# Patient Record
Sex: Female | Born: 1970 | Race: Black or African American | Hispanic: No | Marital: Single | Smoking: Current every day smoker
Health system: Southern US, Community
[De-identification: ages and names within clinical notes are randomized; demographics above are authoritative.]

## PROBLEM LIST (undated history)

## (undated) DIAGNOSIS — I10 Essential (primary) hypertension: Secondary | ICD-10-CM

## (undated) DIAGNOSIS — M1990 Unspecified osteoarthritis, unspecified site: Secondary | ICD-10-CM

## (undated) DIAGNOSIS — R519 Headache, unspecified: Secondary | ICD-10-CM

## (undated) DIAGNOSIS — K219 Gastro-esophageal reflux disease without esophagitis: Secondary | ICD-10-CM

## (undated) DIAGNOSIS — R569 Unspecified convulsions: Secondary | ICD-10-CM

## (undated) DIAGNOSIS — Z8614 Personal history of Methicillin resistant Staphylococcus aureus infection: Secondary | ICD-10-CM

## (undated) HISTORY — PX: APPENDECTOMY: SHX54

## (undated) HISTORY — PX: CHOLECYSTECTOMY: SHX55

## (undated) HISTORY — PX: TUBAL LIGATION: SHX77

---
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
## 2005-09-20 ENCOUNTER — Other Ambulatory Visit: Payer: Self-pay

## 2005-09-21 ENCOUNTER — Inpatient Hospital Stay: Payer: Self-pay | Admitting: Internal Medicine

## 2005-10-27 ENCOUNTER — Emergency Department: Payer: Self-pay | Admitting: General Practice

## 2005-10-27 ENCOUNTER — Other Ambulatory Visit: Payer: Self-pay

## 2006-05-27 ENCOUNTER — Other Ambulatory Visit: Payer: Self-pay

## 2006-05-27 ENCOUNTER — Emergency Department: Payer: Self-pay | Admitting: Emergency Medicine

## 2006-08-26 ENCOUNTER — Emergency Department: Payer: Self-pay | Admitting: Emergency Medicine

## 2007-02-18 ENCOUNTER — Emergency Department (HOSPITAL_COMMUNITY): Admission: EM | Admit: 2007-02-18 | Discharge: 2007-02-19 | Payer: Self-pay | Admitting: Family Medicine

## 2007-03-15 ENCOUNTER — Emergency Department (HOSPITAL_COMMUNITY): Admission: EM | Admit: 2007-03-15 | Discharge: 2007-03-15 | Payer: Self-pay | Admitting: Emergency Medicine

## 2007-06-25 ENCOUNTER — Emergency Department (HOSPITAL_COMMUNITY): Admission: EM | Admit: 2007-06-25 | Discharge: 2007-06-25 | Payer: Self-pay | Admitting: Emergency Medicine

## 2008-02-22 ENCOUNTER — Emergency Department (HOSPITAL_COMMUNITY): Admission: EM | Admit: 2008-02-22 | Discharge: 2008-02-22 | Payer: Self-pay | Admitting: Emergency Medicine

## 2008-04-13 ENCOUNTER — Emergency Department (HOSPITAL_COMMUNITY): Admission: EM | Admit: 2008-04-13 | Discharge: 2008-04-13 | Payer: Self-pay | Admitting: Emergency Medicine

## 2008-05-08 ENCOUNTER — Emergency Department (HOSPITAL_COMMUNITY): Admission: EM | Admit: 2008-05-08 | Discharge: 2008-05-08 | Payer: Self-pay | Admitting: Emergency Medicine

## 2008-05-12 ENCOUNTER — Emergency Department (HOSPITAL_COMMUNITY): Admission: EM | Admit: 2008-05-12 | Discharge: 2008-05-12 | Payer: Self-pay | Admitting: Emergency Medicine

## 2008-07-05 ENCOUNTER — Emergency Department (HOSPITAL_COMMUNITY): Admission: EM | Admit: 2008-07-05 | Discharge: 2008-07-05 | Payer: Self-pay | Admitting: Emergency Medicine

## 2008-10-24 ENCOUNTER — Emergency Department (HOSPITAL_COMMUNITY): Admission: EM | Admit: 2008-10-24 | Discharge: 2008-10-24 | Payer: Self-pay | Admitting: Emergency Medicine

## 2008-11-18 DIAGNOSIS — Z8614 Personal history of Methicillin resistant Staphylococcus aureus infection: Secondary | ICD-10-CM

## 2008-11-18 HISTORY — DX: Personal history of Methicillin resistant Staphylococcus aureus infection: Z86.14

## 2009-01-18 ENCOUNTER — Inpatient Hospital Stay (HOSPITAL_COMMUNITY): Admission: EM | Admit: 2009-01-18 | Discharge: 2009-01-20 | Payer: Self-pay | Admitting: Emergency Medicine

## 2009-01-18 ENCOUNTER — Encounter (INDEPENDENT_AMBULATORY_CARE_PROVIDER_SITE_OTHER): Payer: Self-pay | Admitting: General Surgery

## 2009-01-26 ENCOUNTER — Ambulatory Visit: Payer: Self-pay | Admitting: Family Medicine

## 2009-01-26 ENCOUNTER — Encounter (INDEPENDENT_AMBULATORY_CARE_PROVIDER_SITE_OTHER): Payer: Self-pay | Admitting: Family Medicine

## 2009-01-26 LAB — CONVERTED CEMR LAB
ALT: 147 units/L — ABNORMAL HIGH (ref 0–35)
AST: 113 units/L — ABNORMAL HIGH (ref 0–37)
Alkaline Phosphatase: 58 units/L (ref 39–117)
Sodium: 140 meq/L (ref 135–145)
Total Bilirubin: 0.2 mg/dL — ABNORMAL LOW (ref 0.3–1.2)
Total Protein: 7.6 g/dL (ref 6.0–8.3)

## 2009-01-27 ENCOUNTER — Encounter (INDEPENDENT_AMBULATORY_CARE_PROVIDER_SITE_OTHER): Payer: Self-pay | Admitting: Family Medicine

## 2009-01-27 LAB — CONVERTED CEMR LAB
HCV Ab: NEGATIVE
Hep A IgM: NEGATIVE

## 2009-02-27 ENCOUNTER — Emergency Department (HOSPITAL_COMMUNITY): Admission: EM | Admit: 2009-02-27 | Discharge: 2009-02-28 | Payer: Self-pay | Admitting: Emergency Medicine

## 2009-07-05 ENCOUNTER — Emergency Department: Payer: Self-pay | Admitting: Internal Medicine

## 2009-11-03 ENCOUNTER — Emergency Department: Payer: Self-pay | Admitting: Emergency Medicine

## 2009-12-19 ENCOUNTER — Emergency Department: Payer: Self-pay | Admitting: Emergency Medicine

## 2010-10-30 ENCOUNTER — Emergency Department: Payer: Self-pay | Admitting: Internal Medicine

## 2010-12-17 ENCOUNTER — Emergency Department: Payer: Self-pay | Admitting: Emergency Medicine

## 2011-02-27 LAB — CULTURE, ROUTINE-ABSCESS

## 2011-02-28 LAB — WET PREP, GENITAL
Trich, Wet Prep: NONE SEEN
WBC, Wet Prep HPF POC: NONE SEEN
Yeast Wet Prep HPF POC: NONE SEEN

## 2011-02-28 LAB — COMPREHENSIVE METABOLIC PANEL
ALT: 18 U/L (ref 0–35)
AST: 23 U/L (ref 0–37)
Alkaline Phosphatase: 41 U/L (ref 39–117)
BUN: 2 mg/dL — ABNORMAL LOW (ref 6–23)
CO2: 24 mEq/L (ref 19–32)
Calcium: 8.5 mg/dL (ref 8.4–10.5)
Calcium: 9 mg/dL (ref 8.4–10.5)
GFR calc Af Amer: >60 mL/min (ref >60)
GFR calc non Af Amer: >60 mL/min (ref >60)
GFR calc non Af Amer: >60 mL/min (ref >60)
Glucose, Bld: 98 mg/dL (ref 70–99)
Sodium: 137 mEq/L (ref 135–145)
Total Protein: 6.5 g/dL (ref 6.0–8.3)
Total Protein: 7.5 g/dL (ref 6.0–8.3)

## 2011-02-28 LAB — DIFFERENTIAL
Eosinophils Absolute: 0.1 10*3/uL (ref 0.0–0.7)
Eosinophils Relative: 1 % (ref 0–5)
Lymphs Abs: 2.7 10*3/uL (ref 0.7–4.0)
Monocytes Absolute: 0.3 10*3/uL (ref 0.1–1.0)
Monocytes Relative: 3 % (ref 3–12)

## 2011-02-28 LAB — PHENYTOIN LEVEL, FREE AND TOTAL
Phenytoin Bound: 13.3 ug/mL
Phenytoin, Free: 1.47 ug/mL (ref 1.00–2.00)
Phenytoin, Total: 14.6 ug/mL (ref 10.0–20.0)

## 2011-02-28 LAB — CBC
MCHC: 34.2 g/dL (ref 30.0–36.0)
RBC: 4.26 MIL/uL (ref 3.87–5.11)

## 2011-02-28 LAB — PHENYTOIN LEVEL, TOTAL
Phenytoin Lvl: 13.8 ug/mL (ref 10.0–20.0)
Phenytoin Lvl: <2.5 ug/mL — ABNORMAL LOW (ref 10.0–20.0)

## 2011-02-28 LAB — URINE MICROSCOPIC-ADD ON

## 2011-02-28 LAB — URINALYSIS, ROUTINE W REFLEX MICROSCOPIC
Bilirubin Urine: NEGATIVE
Glucose, UA: NEGATIVE mg/dL
Hgb urine dipstick: NEGATIVE
Nitrite: NEGATIVE
Specific Gravity, Urine: 1.017 (ref 1.005–1.030)
pH: 6 (ref 5.0–8.0)

## 2011-02-28 LAB — POCT PREGNANCY, URINE: Preg Test, Ur: NEGATIVE

## 2011-04-02 NOTE — Consult Note (Signed)
Barbara, Petersen NO.:  [id number]   MEDICAL RECORD NO.:  [id number]          PATIENT TYPE:  INP   LOCATION:  2550                         FACILITY:  [facility]   PHYSICIAN:  Vania Rea, M.D. DATE OF BIRTH:  1971-01-08   DATE OF CONSULTATION:  01/18/2009  DATE OF DISCHARGE:                                 CONSULTATION   PRIMARY CARE PHYSICIAN:  Dr. Pecola Leisure at [facility].   REQUESTING PHYSICIAN:  Dr. Chevis Pretty, general surgeon.   REASON FOR CONSULTATION:  Recurrent seizures postoperatively.   IMPRESSION:  Recurrent seizures of multifactorial etiology including:  1. Noncompliance with antiseizure medication.  2. Seizure threshold reduced by anesthesia medications including      neostigmine and Robinul.  3. Status post laparoscopic appendectomy for acute appendicitis.  4. 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   RECOMMENDATIONS:  1. Check Dilantin and Depakote levels.  Then immediately load with      Dilantin 1.5 g, load with Depakote if seizure persists.  2. Valium 10 mg IV p.r.n. seizures or call M.D. if patient requires      more than 2 doses.  Consult neurology if seizures persist despite      these measures.  3. Check Dilantin levels daily.  Start regularly scheduled Dilantin      doses 12 hours after successful loading.   HISTORY OF PRESENT ILLNESS:  This is a 40 year old obese African-  American lady with a history of seizure disorder who reports that she  has been noncompliant with her medications for the past 2 months.  Said  she usually goes to Dr. Pecola Leisure at [facility] in [place].  She reports that since discontinuing her medications 2 months she has  had no seizures.  The patient was admitted from the emergency room last  night with a diagnosis of acute appendicitis and was taken to the  operative theater for laparoscopic appendectomy.  The appendectomy  proceeded successfully.  Anesthesia was induced at 2:20, and the patient  was successfully extubated at 3:25 a.m.  Muscle relaxants were reversed  with neostigmine and Robinul.  By 3:31 a.m., the patient was awake,  talking and moving well, transferred to PACU.  At 3:55, the patient had  what was regarded as a witnessed generalized seizure.  Nurses noted that  although patient was seizing she was on a telemetry monitor, there were  no changes in her blood pressure, respirations or heart rate with the  seizure.  Her O2 saturation did not drop.  The patient received Valium 5  mg IV but soon after had another similar episode, and the hospitalist  service was called to assist with management.  Since that time under the  hospitalist's orders, the patient has been loaded with Dilantin 1500 mg.  The patient has had a total dose of 20 mg of Valium but continues to  have generalized seizure episodes, all of them characterized by stable  cardiovascular signs, stable vital signs.  No incontinence of urine  although she did ask to pass her urine at the end of the seizure episode  and also characterized by  rapid reawakening and easy reorientation  within a few minutes after each episode.   There is no fever, chills.  No chest pain or shortness of breath.  She  does complain of mild abdominal pain associated with the procedure.   PAST MEDICAL HISTORY:  Seizure disorder.   MEDICATIONS:  1. Dilantin 300 mg at bedtime.  2. Depakote 1 tablet at bedtime, presumably 500 mg.   ALLERGIES:  No known drug allergies.   SOCIAL HISTORY:  She does have a history of tobacco use.  No history of  alcohol or illicit drug use.   FAMILY HISTORY:  Unable to obtain because patient very distracted in  trying to get a history.   REVIEW OF SYSTEMS:  Other than noted above, a 10-point review of systems  is unremarkable.   PHYSICAL EXAMINATION:  Obese, small-framed, African-American lady lying  in bed.  VITAL SIGNS:  Temperature is 97, pulse 70, blood pressure 120/60,  respirations 15,  saturating at 96% on 2 liters.  Pupils are round and equal.  Mucous membranes pink and anicteric.  No cervical lymphadenopathy or thyromegaly.  No jugular venous  distention.  Her chest is clear to auscultation bilaterally.  CARDIOVASCULAR SYSTEM:  Regular rhythm.  No murmur.  Her abdomen is obese and soft.  No tenderness is elicited surprisingly  with the exam.  Her extremities are without edema.  She has 2+ pulses bilaterally.  CENTRAL NERVOUS SYSTEM:  Cranial nerves II-XII are grossly intact, and  she moves all limbs equally.   LABORATORY DATA:  Her white count is 10.4, hemoglobin 12.3, MCV 84,  platelets 217.  Absolute granulocyte count is 7.2.  Her serum chemistry  is completely normal.  Her liver function tests are completely normal.  CT of the abdomen and pelvis done preoperatively shows acute  appendicitis.  Vaginal wet prep showed many clue cells, no yeast or  Trichomonas or white cells.      Vania Rea, M.D.  Electronically Signed     LC/MEDQ  D:  01/18/2009  T:  01/18/2009  Job:  [id number]   cc:   Ollen Gross. Vernell Morgans, M.D.

## 2011-04-02 NOTE — H&P (Signed)
NAMEBRISELDA, Barbara Petersen                ACCOUNT NO.:  [id number]   MEDICAL RECORD NO.:  [id number]          PATIENT TYPE:  INP   LOCATION:  2550                         FACILITY:  [facility]   PHYSICIAN:  Ollen Gross. Vernell Morgans, M.D. DATE OF BIRTH:  [birth year]   DATE OF ADMISSION:  01/17/2009  DATE OF DISCHARGE:                              HISTORY & PHYSICAL   HISTORY OF PRESENT ILLNESS:  Barbara Petersen is a 40 year old black female who  has been experiencing right lower quadrant pain since Sunday.  The pain  has been associated with nausea and vomiting as well as chills.  The  pain has been getting worse since Sunday, to the point where she came to  the emergency department.  She otherwise denies any chest pain,  shortness of breath, diarrhea, or dysuria.  She does state that she had  a pain similar to this about 3-4 months ago, but nothing was done about  it and pain finally went away.   REVIEW OF SYSTEMS:  Her other review of systems are unremarkable.   PAST MEDICAL HISTORY:  Significant for seizure disorder.   PAST SURGICAL HISTORY:  Significant for laparoscopic cholecystectomy and  laparoscopic tubal ligation.   MEDICATIONS:  Include Dilantin and Depakote.   ALLERGIES:  No known drug allergies.   SOCIAL HISTORY:  She smokes about a pack of cigarettes a day and  occasionally drinks alcohol.   FAMILY HISTORY:  Noncontributory.   PHYSICAL EXAMINATION:  VITAL SIGNS:  Her temperature is 98.1, blood  pressure 120/77, and pulse 70.  GENERAL:  She is a well-developed, well-nourished black female in no  acute distress.  SKIN:  Warm and dry.  No jaundice.  EYES:  Extraocular muscles are intact.  Pupils equal, round, and  reactive to light.  Sclerae nonicteric.  LUNGS:  Clear bilaterally with no use of accessory respiratory muscles.  HEART:  Regular rate and rhythm with impulse in the left chest.  ABDOMEN:  Soft.  She does have some significant right lower quadrant  tenderness with some guarding.   No generalized peritonitis.  No palpable  mass or hepatosplenomegaly.  EXTREMITIES:  No cyanosis, clubbing, or edema with good strength in arms  and legs.  PSYCHIATRIC:  She is alert and oriented x3 with no evidence today of  anxiety or depression.   LABORATORY DATA:  Significant for a white count of 10.4.  Her UA was  negative.  Pregnancy was negative.  Her CT scan was reviewed with the  radiologist and it did show acute appendicitis.   ASSESSMENT AND PLAN:  This is a 40 year old black female with what  appears to be acute appendicitis.  Because of the risk of perforation  and sepsis, I think she would benefit from having her appendix removed.  She  would also like to have this done.  I have explained her in detail the  risks and benefits of the operation to remove the appendix as well as  some of the technical aspects, and she understands and wishes to  proceed.  We will obtain some routine preoperative lab work and  preparation in doing this for tonight.      Ollen Gross. Vernell Morgans, M.D.  Electronically Signed     PST/MEDQ  D:  01/18/2009  T:  01/18/2009  Job:  [id number]

## 2011-04-02 NOTE — Op Note (Signed)
Barbara Petersen, Barbara Petersen                ACCOUNT NO.:  [id number]   MEDICAL RECORD NO.:  [id number]          PATIENT TYPE:  INP   LOCATION:  3314                         FACILITY:  [facility]   PHYSICIAN:  Ollen Gross. Vernell Morgans, M.D. DATE OF BIRTH:  Oct 19, 1971   DATE OF PROCEDURE:  01/18/2009  DATE OF DISCHARGE:                               OPERATIVE REPORT   PREOPERATIVE DIAGNOSIS:  Acute appendicitis.   POSTOPERATIVE DIAGNOSIS:  Acute appendicitis.   PROCEDURE:  Laparoscopic appendectomy.   SURGEON:  Ollen Gross. Vernell Morgans, MD   ANESTHESIA:  General endotracheal.   PROCEDURE:  After informed consent was obtained, the patient was brought  to the operating and placed in supine position on the operating table.  After adequate induction of general anesthesia, the patient's abdomen  was prepped with Betadine and draped in usual sterile manner.  The area  below the umbilicus was infiltrated with 0.25% Marcaine.  A small  incision was made with 15 blade knife.  This incision was carried down  through the subcutaneous tissue bluntly with a hemostat and [employer]  retractor until the linea alba was identified.  The linea alba was  incised with 15 blade knife and each side was grasped with Kocher clamps  and elevated anteriorly.  The preperitoneal space was then probed  bluntly with hemostat until the peritoneum was opened.  Next, access was  gained to the abdominal cavity.  A 0 Vicryl pursestring stitch was  placed in the fascia around the opening and Hasson cannula was placed  through the opening and anchored in place with previously placed Vicryl  pursestring stitch.  The abdomen was then insufflated with carbon  dioxide without difficulty.  The patient was placed in Trendelenburg  position, rotated with the right side up and laparoscope was inserted  through the Hasson cannula.  The right lower quadrant was inspected.  Next, the suprapubic area was infiltrated with 0.25% Marcaine.  A small  incision  was made with 15 blade knife and then 10/11-mm port was placed  bluntly through this incision into the abdominal cavity under direct  vision.  A site was then chosen above the umbilicus with a placement of  a 5-mm port.  This area was infiltrated 0.25% Marcaine.  A small stab  incision was made with 15 blade knife and a 5-mm port was placed bluntly  through this incision into the abdominal cavity under direct vision.  The laparoscope was then moved to the suprapubic port.  Using a million-  dollar grasper and harmonic scalpel, the right lower quadrant was  examined and enlarged and inflamed appendix, it was readily identified.  The appendix was elevated anteriorly with a million-dollar grasper.  The  mesoappendix was then taken down sharply with the harmonic scalpel  without difficulty.  Dissection was carried down to the base of the  appendix at its junction with the cecum.  Once this area was cleared of  any tissue, the laparoscopic GIA 45 blue load stapler and six-row  stapler was placed through the Hasson cannula across the base of the  appendix at its  junction with the cecum clamped and fired thereby  dividing the base of the appendix between staple lines.  A laparoscopic  bag was then inserted through the Hasson cannula, the appendix was  placed in the bag and the bag was sealed.  The staple line was examined  again and found to be hemostatic and healthy appearing.  The abdomen was  then irrigated copious amounts of saline until the effluent was clear.  The rest of the abdomen was inspected.  No other gross abnormalities  were noted.  The appendix and bag were then removed with the Hasson  cannula through the infraumbilical port without difficulty.  The fascial  defect was closed with previous placed Vicryl pursestring stitch as well  as with another figure-of-eight 0 Vicryl stitch.  The rest of ports were  removed under direct vision and were found to be hemostatic.  The gas  was  allowed to escape.  The skin  incisions were all closed with interrupted 4-0 Monocryl and subcuticular  stitches.  Dermabond dressings were applied.  The patient tolerated the  procedure well.  At the end of the case, all needle, sponge, and  instrument counts were correct.  The patient was then awakened and taken  to recovery room in stable condition.      Ollen Gross. Vernell Morgans, M.D.  Electronically Signed     PST/MEDQ  D:  01/18/2009  T:  01/18/2009  Job:  [id number]

## 2011-04-02 NOTE — Discharge Summary (Signed)
NAMECHENELLE, BENNING                ACCOUNT NO.:  [id number]   MEDICAL RECORD NO.:  [id number]          PATIENT TYPE:  INP   LOCATION:  3713                         FACILITY:  [facility]   PHYSICIAN:  Ollen Gross. Vernell Morgans, M.D. DATE OF BIRTH:  [birth year]   DATE OF ADMISSION:  01/17/2009  DATE OF DISCHARGE:  01/20/2009                               DISCHARGE SUMMARY   DISCHARGING PHYSICIAN:  Dr. Janee Morn.   CONSULTANTS:  Civil engineer, contracting.   HISTORY OF PRESENT ILLNESS:  Ms. Dorion is a 40 year old female patient  with history of seizure disorder who has been developing right lower  quadrant pain with nausea and vomiting since Sunday.  This was  associated with chills.  Because of progressive pain, she presented to  the ER.  On clinical exam, her findings were consistent with probable  appendicitis.  Her white count was 10,400.  CT demonstrated acute  appendicitis.  Exam by Dr. Tawanna Cooler also was consistent with appendicitis.  Given the fact, the patient had right lower quadrant tenderness with  guarding.  She was admitted with a diagnosis of acute appendicitis.   HOSPITAL COURSE:  The patient was taken to the OR directly from the ER,  where she underwent uncomplicated laparoscopic appendectomy for  nonperforated appendicitis.  She was sent back to the PACU to recover.  The patient had grand mal seizure activity while in the PACU, suspected  due to subtherapeutic levels of medications due to recent illness.  Internal Medicine consult was called and they managed this by giving the  patient Dilantin boluses and IV Depakote and then transitioning over her  to p.o. Dilantin.   From a surgical standpoint, the patient has done well.  She has had low-  grade fever.  White count has normalized.  She has been tolerating a  diet and oral pain medications.  Her incisions have remained clean, dry,  and intact.  She remained in the hospitalization to monitor for seizure  activity.  She had another  seizure in the afternoon of January 18, 2009,  but has not had any further seizure activity.  Her free Dilantin level  was pending at time of discharge.  A Dilantin level was checked after a  bolus on January 19, 2009, and this was at 13.8.  Depakote level was still  less than 10.  In talking with the patient, it was discovered that she  could not afford her medications because of the expense and therefore  had not been taking her medications prior to becoming sick and this is  why she began having seizure activity.  Case management has been  consulted and internal medicine assisted in helping to get the patient  information on how to establish with [facility].  We will also have at  least 3 days what the patient's antiseizure medications paid for use in  the hospital service, the case management.  She will be responsible for  obtaining the ibuprofen and Percocet prescriptions because these are not  covered.   DISCHARGE DIAGNOSES:  1. Acute appendicitis, nonperforated, status post laparoscopic  appendectomy.  Pathology consistent with an acute appendicitis.  No      malignancy.  2. Seizure activity and patient with known seizure disorder secondary      to subtherapeutic blood levels.  3. There is medication noncompliance due to social issues.   DISCHARGE MEDICATIONS:  The patient medicines are as follows:  1. Dilantin 100 mg p.o. t.i.d.  2. Depakote ER 500 mg at hour sleep.  3. Percocet 5/325, 1-2 tablets every 4 hours needed for pain.  4. Ibuprofen 600 mg t.i.d. as needed for pain.  May use in addition of      Percocet.  May substitute over-the-counter ibuprofen if that is      cheaper.   Other instructions from internal medicine more specific to the patient  being seen in [facility].  Recheck Dilantin level, Depakote level, and  CMET with your family doctor in 1-2 weeks.  No driving for 6 months and  instruction specific to General Surgery.   ACTIVITY:  Increase activity slowly.   May walk up steps.  May shower.  No lifting greater than 10 pounds for 2 weeks and the driving  restrictions are specific to the seizure disorder.   DIET:  No restrictions.  Wound care not applicable.  The patient has  Dermabond for approximation of skin.      Allison L. Rennis Harding, N.POllen Gross. Vernell Morgans, M.D.  Electronically Signed    ALE/MEDQ  D:  01/20/2009  T:  01/20/2009  Job:  [id number]   cc:   Dala Dock

## 2011-04-02 NOTE — Discharge Summary (Signed)
Barbara Petersen, SCHRYVER                ACCOUNT NO.:  [id number]   MEDICAL RECORD NO.:  [id number]          PATIENT TYPE:  INP   LOCATION:  3713                         FACILITY:  [facility]   PHYSICIAN:  Gabrielle Dare. Janee Morn, M.D.DATE OF BIRTH:  27-Nov-1970   DATE OF ADMISSION:  01/17/2009  DATE OF DISCHARGE:  01/20/2009                               DISCHARGE SUMMARY   ADDENDUM   I neglected to mention that the patient will need to follow up at the  [facility] on Tuesday January 31, 2009, at 3:30 p.m.  The telephone number is [phone].      Allison L. Rennis Harding, N.P.      Gabrielle Dare Janee Morn, M.D.  Electronically Signed    ALE/MEDQ  D:  01/20/2009  T:  01/20/2009  Job:  [id number]   cc:   Ollen Gross. Vernell Morgans, M.D.

## 2011-05-24 ENCOUNTER — Emergency Department: Payer: Self-pay | Admitting: *Deleted

## 2011-08-03 ENCOUNTER — Observation Stay: Payer: Self-pay | Admitting: Internal Medicine

## 2011-08-13 LAB — COMPREHENSIVE METABOLIC PANEL
ALT: 15
AST: 15
Albumin: 3.7
CO2: 21
Calcium: 8.5
GFR calc Af Amer: >60
GFR calc non Af Amer: >60
Sodium: 136

## 2011-08-13 LAB — DIFFERENTIAL
Eosinophils Relative: 0
Lymphocytes Relative: 15
Lymphs Abs: 0.6 — ABNORMAL LOW
Monocytes Absolute: 0.3

## 2011-08-13 LAB — URINALYSIS, ROUTINE W REFLEX MICROSCOPIC
Bilirubin Urine: NEGATIVE
Glucose, UA: NEGATIVE
Ketones, ur: NEGATIVE
pH: 7

## 2011-08-13 LAB — GC/CHLAMYDIA PROBE AMP, GENITAL: GC Probe Amp, Genital: NEGATIVE

## 2011-08-13 LAB — WET PREP, GENITAL: WBC, Wet Prep HPF POC: NONE SEEN

## 2011-08-13 LAB — CBC
HCT: 34.9 — ABNORMAL LOW
Hemoglobin: 11.9 — ABNORMAL LOW
RBC: 4.25
WBC: 4.1

## 2011-08-13 LAB — URINE MICROSCOPIC-ADD ON

## 2011-08-13 LAB — PREGNANCY, URINE: Preg Test, Ur: NEGATIVE

## 2011-08-14 LAB — URINALYSIS, ROUTINE W REFLEX MICROSCOPIC
Bilirubin Urine: NEGATIVE
Glucose, UA: NEGATIVE
Specific Gravity, Urine: 1.023
pH: 6.5

## 2011-08-14 LAB — URINE MICROSCOPIC-ADD ON

## 2011-08-15 LAB — URINALYSIS, ROUTINE W REFLEX MICROSCOPIC
Bilirubin Urine: NEGATIVE
Ketones, ur: NEGATIVE
Nitrite: NEGATIVE
Protein, ur: 100 — AB

## 2011-08-15 LAB — URINE CULTURE: Colony Count: >=100000

## 2011-08-15 LAB — URINE MICROSCOPIC-ADD ON

## 2011-08-23 LAB — CBC
HCT: 36.4 % (ref 36.0–46.0)
MCHC: 33.5 g/dL (ref 30.0–36.0)
MCV: 84.8 fL (ref 78.0–100.0)
RBC: 4.3 MIL/uL (ref 3.87–5.11)
WBC: 6.7 10*3/uL (ref 4.0–10.5)

## 2011-08-23 LAB — URINALYSIS, ROUTINE W REFLEX MICROSCOPIC
Bilirubin Urine: NEGATIVE
Glucose, UA: NEGATIVE mg/dL
Ketones, ur: NEGATIVE mg/dL
Nitrite: NEGATIVE
Specific Gravity, Urine: 1.02 (ref 1.005–1.030)
pH: 6.5 (ref 5.0–8.0)

## 2011-08-23 LAB — POCT PREGNANCY, URINE: Preg Test, Ur: NEGATIVE

## 2011-08-23 LAB — DIFFERENTIAL
Basophils Absolute: 0 10*3/uL (ref 0.0–0.1)
Eosinophils Relative: 2 % (ref 0–5)
Lymphocytes Relative: 38 % (ref 12–46)
Neutro Abs: 3.7 10*3/uL (ref 1.7–7.7)
Neutrophils Relative %: 56 % (ref 43–77)

## 2011-08-23 LAB — URINE MICROSCOPIC-ADD ON

## 2011-08-23 LAB — COMPREHENSIVE METABOLIC PANEL
BUN: 12 mg/dL (ref 6–23)
CO2: 25 mEq/L (ref 19–32)
Calcium: 8.7 mg/dL (ref 8.4–10.5)
Chloride: 106 mEq/L (ref 96–112)
Creatinine, Ser: 0.69 mg/dL (ref 0.4–1.2)
GFR calc non Af Amer: >60 mL/min (ref >60)
Glucose, Bld: 77 mg/dL (ref 70–99)
Total Bilirubin: 0.5 mg/dL (ref 0.3–1.2)

## 2011-11-19 DIAGNOSIS — T8859XA Other complications of anesthesia, initial encounter: Secondary | ICD-10-CM

## 2011-11-19 HISTORY — DX: Other complications of anesthesia, initial encounter: T88.59XA

## 2012-03-19 ENCOUNTER — Emergency Department (HOSPITAL_COMMUNITY)
Admission: EM | Admit: 2012-03-19 | Discharge: 2012-03-19 | Disposition: A | Discharge status: Home / Self Care | Payer: Self-pay | Attending: Emergency Medicine | Admitting: Emergency Medicine

## 2012-03-19 ENCOUNTER — Emergency Department (HOSPITAL_COMMUNITY): Payer: Self-pay

## 2012-03-19 ENCOUNTER — Encounter (HOSPITAL_COMMUNITY): Payer: Self-pay | Admitting: Emergency Medicine

## 2012-03-19 DIAGNOSIS — R221 Localized swelling, mass and lump, neck: Secondary | ICD-10-CM | POA: Insufficient documentation

## 2012-03-19 DIAGNOSIS — R51 Headache: Secondary | ICD-10-CM | POA: Insufficient documentation

## 2012-03-19 DIAGNOSIS — Z79899 Other long term (current) drug therapy: Secondary | ICD-10-CM | POA: Insufficient documentation

## 2012-03-19 DIAGNOSIS — R220 Localized swelling, mass and lump, head: Secondary | ICD-10-CM | POA: Insufficient documentation

## 2012-03-19 DIAGNOSIS — I10 Essential (primary) hypertension: Secondary | ICD-10-CM | POA: Insufficient documentation

## 2012-03-19 DIAGNOSIS — M542 Cervicalgia: Secondary | ICD-10-CM | POA: Insufficient documentation

## 2012-03-19 DIAGNOSIS — R509 Fever, unspecified: Secondary | ICD-10-CM | POA: Insufficient documentation

## 2012-03-19 DIAGNOSIS — G40909 Epilepsy, unspecified, not intractable, without status epilepticus: Secondary | ICD-10-CM | POA: Insufficient documentation

## 2012-03-19 HISTORY — DX: Essential (primary) hypertension: I10

## 2012-03-19 HISTORY — DX: Unspecified convulsions: R56.9

## 2012-03-19 LAB — DIFFERENTIAL
Basophils Absolute: 0 10*3/uL (ref 0.0–0.1)
Basophils Relative: 0 % (ref 0–1)
Eosinophils Absolute: 0.1 10*3/uL (ref 0.0–0.7)
Eosinophils Relative: 2 % (ref 0–5)
Neutrophils Relative %: 50 % (ref 43–77)

## 2012-03-19 LAB — COMPREHENSIVE METABOLIC PANEL
ALT: 12 U/L (ref 0–35)
Albumin: 4 g/dL (ref 3.5–5.2)
Alkaline Phosphatase: 57 U/L (ref 39–117)
Calcium: 9.2 mg/dL (ref 8.4–10.5)
GFR calc Af Amer: >90 mL/min (ref >90)
Glucose, Bld: 83 mg/dL (ref 70–99)
Potassium: 4.2 mEq/L (ref 3.5–5.1)
Sodium: 136 mEq/L (ref 135–145)
Total Protein: 7.8 g/dL (ref 6.0–8.3)

## 2012-03-19 LAB — CBC
MCH: 28.8 pg (ref 26.0–34.0)
MCHC: 34.1 g/dL (ref 30.0–36.0)
MCV: 84.7 fL (ref 78.0–100.0)
Platelets: 273 10*3/uL (ref 150–400)
RBC: 4.3 MIL/uL (ref 3.87–5.11)
RDW: 14.5 % (ref 11.5–15.5)

## 2012-03-19 MED ORDER — NAPROXEN 500 MG PO TABS: 500.0000 mg | ORAL_TABLET | Freq: Two times a day (BID) | ORAL | Status: AC

## 2012-03-19 MED ORDER — IOHEXOL 300 MG/ML  SOLN
70.0000 mL | Freq: Once | INTRAMUSCULAR | Status: AC | PRN
  Administered 2012-03-19: 70 mL via INTRAVENOUS

## 2012-03-19 MED ORDER — AMOXICILLIN-POT CLAVULANATE 875-125 MG PO TABS
1.0000 | ORAL_TABLET | ORAL | Status: DC
  Filled 2012-03-19: qty 1

## 2012-03-19 MED ORDER — AMOXICILLIN-POT CLAVULANATE 875-125 MG PO TABS: 1.0000 | ORAL_TABLET | Freq: Two times a day (BID) | ORAL | Status: AC

## 2012-03-19 MED ORDER — SODIUM CHLORIDE 0.9 % IV SOLN
INTRAVENOUS | Status: DC
  Administered 2012-03-19: 19:00:00 via INTRAVENOUS

## 2012-03-19 MED ORDER — AMOXICILLIN-POT CLAVULANATE 875-125 MG PO TABS: 1.0000 | ORAL_TABLET | Freq: Two times a day (BID) | ORAL | Status: DC

## 2012-03-19 MED ORDER — HYDROCODONE-ACETAMINOPHEN 5-325 MG PO TABS: ORAL_TABLET | ORAL | Status: AC

## 2012-03-19 NOTE — ED Provider Notes (Signed)
History  Scribed for Hilario Quarry, MD, the patient was seen in room STRE7/STRE7. This chart was scribed by Candelaria Stagers. The patient's care started at 4:14 PM    CSN: [id number]  Arrival date & time 03/19/12  1251   None     Chief Complaint  Patient presents with  . Facial Pain     HPI Barbara Petersen is a 40 y.o. female who presents to the Emergency Department complaining of facial pain and swelling on the left side that started about five days ago and has gotten worse since.  Chewing and swallowing makes the pain worse.  Pt has a h/o seizures and HTN.  Pt is seen at the [facility] in [place].    Past Medical History  Diagnosis Date  . Seizures   . Hypertension     Past Surgical History  Procedure Date  . Appendectomy   . Cholecystectomy   . Tubal ligation     No family history on file.  History  Substance Use Topics  . Smoking status: Current Everyday Smoker  . Smokeless tobacco: Not on file  . Alcohol Use: Yes    OB History    Grav Para Term Preterm Abortions TAB SAB Ect Mult Living                  Review of Systems  Constitutional: Positive for fever.  HENT: Positive for trouble swallowing and neck pain (left sided).   All other systems reviewed and are negative.    Allergies  Review of patient's allergies indicates no known allergies.  Home Medications   Current Outpatient Rx  Name Route Sig Dispense Refill  . LISINOPRIL 20 MG PO TABS Oral Take 20 mg by mouth daily.    Marland Kitchen PHENYTOIN SODIUM EXTENDED 100 MG PO CAPS Oral Take 300 mg by mouth at bedtime.      BP 157/86  Pulse 101  Temp(Src) 98.1 F (36.7 C) (Oral)  Resp 18  SpO2 99%  Physical Exam  Nursing note and vitals reviewed. Constitutional: She is oriented to person, place, and time. She appears well-developed and well-nourished.  HENT:  Head: Normocephalic and atraumatic.       Swelling and tenderness anterior to her left ear.  Tender in neck.  Inside of mouth normal.   Floor of mouth tender on palpation.  Carotid duct tender on palpation, not able to express anything from carotid duct.    Eyes: EOM are normal. Pupils are equal, round, and reactive to light.  Cardiovascular: Normal rate and regular rhythm.   Pulmonary/Chest: Effort normal and breath sounds normal.  Musculoskeletal: Normal range of motion.  Neurological: She is alert and oriented to person, place, and time.  Skin: Skin is warm and dry.  Psychiatric: She has a normal mood and affect. Her behavior is normal.    ED Course  Procedures  DIAGNOSTIC STUDIES: Oxygen Saturation is 99% on room air, normal by my interpretation.    COORDINATION OF CARE: 4:53PM Ordered: 0.9% sodium chloride infusion; CBC; Differential; Comprehensive metabolic panel; Amylase; CT Soft Tissue Neck w contrast; EKG 12-Lead     Labs Reviewed  CBC  DIFFERENTIAL  COMPREHENSIVE METABOLIC PANEL  AMYLASE   No results found.   No diagnosis found.   I personally performed the services described in this documentation, which was scribed in my presence. The recorded information has been reviewed and considered.  MDM  Patient was to have CT of the neck to assess for  parotid stone or other abnormalities causing the swelling and pain. She is admitted to the CDU to complete her workup. I discussed her care with Rhea Bleacher PAC.      Hilario Quarry, MD 03/19/12 1710

## 2012-03-19 NOTE — ED Provider Notes (Signed)
History     CSN: [id number]  Arrival date & time 03/19/12  1251   First MD Initiated Contact with Patient 03/19/12 1612      Chief Complaint  Patient presents with  . Facial Pain    (Consider location/radiation/quality/duration/timing/severity/associated sxs/prior treatment) HPI  Past Medical History  Diagnosis Date  . Seizures   . Hypertension     Past Surgical History  Procedure Date  . Appendectomy   . Cholecystectomy   . Tubal ligation     No family history on file.  History  Substance Use Topics  . Smoking status: Current Everyday Smoker  . Smokeless tobacco: Not on file  . Alcohol Use: Yes    OB History    Grav Para Term Preterm Abortions TAB SAB Ect Mult Living                  Review of Systems  Allergies  Review of patient's allergies indicates no known allergies.  Home Medications   Current Outpatient Rx  Name Route Sig Dispense Refill  . LISINOPRIL 20 MG PO TABS Oral Take 20 mg by mouth daily.    Marland Kitchen PHENYTOIN SODIUM EXTENDED 100 MG PO CAPS Oral Take 300 mg by mouth at bedtime.      BP 157/86  Pulse 101  Temp(Src) 98.1 F (36.7 C) (Oral)  Resp 18  SpO2 99%  LMP 03/16/2012  Physical Exam  ED Course  Procedures (including critical care time)  Labs Reviewed  COMPREHENSIVE METABOLIC PANEL - Abnormal; Notable for the following:    Total Bilirubin 0.2 (*)    All other components within normal limits  CBC  DIFFERENTIAL  AMYLASE   Ct Soft Tissue Neck W Contrast  03/19/2012  *RADIOLOGY REPORT*  Clinical Data:  Left side swelling, sore throat, pain radiating down the left shoulder  CT NECK WITH CONTRAST  Technique:  Multidetector CT imaging of the neck was performed with intravenous contrast. Sagittal and coronal MPR images reconstructed from axial data set.  Contrast: 70mL OMNIPAQUE IOHEXOL 300 MG/ML  SOLN  Comparison: CT head 07/05/2008  Findings: Visualized intracranial structures unremarkable. Mucosal retention cyst left maxillary  sinus. Slightly prominent parapharyngeal lymphoid tonsillar tissue. No evidence of peritonsillar mass or inflammation. Prevertebral soft tissues normal thickness. Vascular structures patent.  Lung apices and superior mediastinum normal appearance. Scattered normal-sized and minimally prominent lymph nodes in the anterior cervical regions bilaterally. No discrete mass, significant adenopathy, abnormal fluid collection or focal inflammatory process identified. Symmetric appearance of parotid, submandibular, and thyroid glands. No acute osseous findings.  IMPRESSION: No definite acute cervical abnormalities identified.  Original Report Authenticated By: Lollie Marrow, M.D.     1. Neck swelling       7:56 PM Patient from stretcher triage pending CT of neck. These findings are negative for deep space infection or other acute findings. Will d/c to home on augmentin. Patient counseled to return with worsening swelling, trouble breathing, fever, redness of skin on her neck, or any other concerns. Will give primary care referrals. Patient verbalizes understanding and agrees with the plan.  Vital signs reviewed and are as follows: Filed Vitals:   03/19/12 1306  BP: 157/86  Pulse: 101  Temp: 98.1 F (36.7 C)  Resp: 18    Exam:  Gen NAD; Ear tympanic membranes normal bilaterally; Mouth no erythema or dental infections noted; Neck diffuse swelling to left neck with palpable spasm of superior left shoulder, area is mildly tender, no significant erythema, 2+ carotid pulses  bilaterally; Heart RRR, nml S1,S2, no m/r/g; Lungs CTAB; Abd soft, NT, no rebound or guarding; Ext 2+ pedal pulses bilaterally, no edema.     MDM  Neck swelling. CT is negative for significant or dangerous infections. No angioedema. Will treat with antibiotics. Strict return instructions given. Do not suspect ACE related angioedema.       Renne Crigler, [place] 03/19/12 2011

## 2012-03-19 NOTE — Discharge Instructions (Signed)
Please read and follow all provided instructions.  Your diagnoses today include:  1. Neck swelling     Tests performed today include:  CT of neck which did not show any dangerous infections or other problems  Vital signs. See below for your results today.   Medications prescribed:   Vicodin (hydrocodone/acetaminophen) - narcotic pain medication  You have been prescribed narcotic pain medication such as Vicodin or Percocet: DO NOT drive or perform any activities that require you to be awake and alert because this medicine can make you drowsy. BE VERY CAREFUL not to take multiple medicines containing Tylenol (also called acetaminophen). Doing so can lead to an overdose which can damage your liver and cause liver failure and possibly death.    Naproxen - anti-inflammatory pain medication  Do not exceed 500mg  naproxen every 12 hours  You have been prescribed an anti-inflammatory medication or NSAID. Take with food. Take smallest effective dose for the shortest duration needed for your pain. Stop taking if you experience stomach pain or vomiting.    Augmentin - antibiotic  You have been prescribed an antibiotic medicine: take the entire course of medicine even if you are feeling better. Stopping early can cause the antibiotic not to work.  Take any prescribed medications only as directed.  Home care instructions:  Follow any educational materials contained in this packet.  Follow-up instructions: Please follow-up with your primary care provider in the next 3 days for further evaluation of your symptoms. If you do not have a primary care doctor -- see below for referral information.   Return instructions:   Please return to the Emergency Department if you experience worsening symptoms.   Return immediately with difficulty breathing.  Return with worsening neck swelling, trouble swallowing, fever, redness of skin.  Please return if you have any other emergent  concerns.  Additional Information:  Your vital signs today were: BP 157/86  Pulse 101  Temp(Src) 98.1 F (36.7 C) (Oral)  Resp 18  SpO2 99%  LMP 03/16/2012 If your blood pressure (BP) was elevated above 135/85 this visit, please have this repeated by your doctor within one month. -------------- No Primary Care Doctor Call [facility]  [phone] Other agencies that provide inexpensive medical care    [facility]  [phone]    [facility]  [phone]    [facility]  [phone]    [facility]  [phone]    [facility]  [phone]    [facility]  [phone] -------------- RESOURCE GUIDE:  Dental Problems  Patients with Medicaid: [facility] [phone] [address]                                            [phone] [address] Phone:  [phone]                                                   Phone:  [phone]  If unable to pay or uninsured, contact:  [facility] or [facility]. to become qualified for the adult dental clinic.  Chronic  Pain Problems Contact [facility]  [phone] Patients need to be referred by their primary care doctor.  Insufficient Money for Medicine Contact [employer]:  call "211" or [facility] [phone].  Psychological Services [facility]  [phone] [facility]  [phone] [facility]   [phone] (emergency services [phone])  Substance Abuse Resources [facility]  [phone] [facility] [phone] [facility] [place] [phone] Floydene Flock [phone] Residential & Outpatient Substance Abuse Program  [phone]  Abuse/Neglect [facility] Child Abuse Hotline [phone] [facility] Child Abuse Hotline [phone] (After Hours)  Emergency Shelter [facility] [phone]  Maternity  Homes Room at the [place] [facility] [place] [phone] [place] [facility] [phone]  [facility] Resources  [facility]     [employer]                          [facility] [address] [place]                       [address]      [address]  Blondell Reveal Phone:  [phone]                                   Phone:  [phone]                 Phone:  [phone]  [facility] Phone:  [phone]  [facility] Child Abuse Hotline [phone] (After Hours)

## 2012-03-19 NOTE — ED Notes (Signed)
Left side facial swelling since Sunday  Pain in jaw up and ear. Swelling also , hurts to open mouth hurts to eat pain  Went down left arm into chest 1 1/2 weeks ago but that pain went away.

## 2012-03-23 NOTE — ED Provider Notes (Signed)
Please see my h and p. History/physical exam/procedure(s) were performed by non-physician practitioner and as supervising physician I was immediately available for consultation/collaboration. I have reviewed all notes and am in agreement with care and plan.   Hilario Quarry, MD 03/23/12 [phone]

## 2012-06-15 ENCOUNTER — Emergency Department: Payer: Self-pay | Admitting: Emergency Medicine

## 2012-06-15 LAB — ETHANOL
Ethanol %: <0.003 % (ref 0.000–0.080)
Ethanol: <3 mg/dL

## 2012-06-15 LAB — DRUG SCREEN, URINE
Amphetamines, Ur Screen: NEGATIVE (ref ?–1000)
Barbiturates, Ur Screen: NEGATIVE (ref ?–200)
Cocaine Metabolite,Ur ~~LOC~~: NEGATIVE (ref ?–300)
Methadone, Ur Screen: NEGATIVE (ref ?–300)
Tricyclic, Ur Screen: NEGATIVE (ref ?–1000)

## 2012-06-15 LAB — COMPREHENSIVE METABOLIC PANEL
Albumin: 2.9 g/dL — ABNORMAL LOW (ref 3.4–5.0)
Anion Gap: 8 (ref 7–16)
BUN: 7 mg/dL (ref 7–18)
Glucose: 68 mg/dL (ref 65–99)
Osmolality: 281 (ref 275–301)
Potassium: 3.5 mmol/L (ref 3.5–5.1)
Sodium: 143 mmol/L (ref 136–145)
Total Protein: 6 g/dL — ABNORMAL LOW (ref 6.4–8.2)

## 2012-06-15 LAB — URINALYSIS, COMPLETE
Bacteria: NONE SEEN
Bilirubin,UR: NEGATIVE
Blood: NEGATIVE
Glucose,UR: NEGATIVE mg/dL (ref 0–75)
Nitrite: NEGATIVE
Ph: 6 (ref 4.5–8.0)
Specific Gravity: 1.009 (ref 1.003–1.030)
Squamous Epithelial: 7

## 2012-06-15 LAB — CBC
MCH: 28.2 pg (ref 26.0–34.0)
MCV: 84 fL (ref 80–100)
RBC: 4.07 10*6/uL (ref 3.80–5.20)
RDW: 15.6 % — ABNORMAL HIGH (ref 11.5–14.5)

## 2012-06-15 LAB — PREGNANCY, URINE: Pregnancy Test, Urine: NEGATIVE m[IU]/mL

## 2012-09-03 ENCOUNTER — Emergency Department: Payer: Self-pay | Admitting: Unknown Physician Specialty

## 2012-10-08 ENCOUNTER — Emergency Department: Payer: Self-pay | Admitting: Emergency Medicine

## 2012-10-08 LAB — URINALYSIS, COMPLETE
Bilirubin,UR: NEGATIVE
Blood: NEGATIVE
Ketone: NEGATIVE
Ph: 7 (ref 4.5–8.0)
Protein: NEGATIVE
Squamous Epithelial: 1

## 2012-10-08 LAB — CBC WITH DIFFERENTIAL/PLATELET
Basophil #: 0 10*3/uL (ref 0.0–0.1)
Eosinophil #: 0.1 10*3/uL (ref 0.0–0.7)
HCT: 34.2 % — ABNORMAL LOW (ref 35.0–47.0)
Lymphocyte #: 2.8 10*3/uL (ref 1.0–3.6)
MCH: 29.2 pg (ref 26.0–34.0)
MCHC: 34.1 g/dL (ref 32.0–36.0)
MCV: 86 fL (ref 80–100)
Monocyte #: 0.6 x10 3/mm (ref 0.2–0.9)
Neutrophil #: 3.1 10*3/uL (ref 1.4–6.5)
Platelet: 290 10*3/uL (ref 150–440)
RDW: 15.1 % — ABNORMAL HIGH (ref 11.5–14.5)
WBC: 6.7 10*3/uL (ref 3.6–11.0)

## 2012-10-08 LAB — CK TOTAL AND CKMB (NOT AT ARMC): CK, Total: 152 U/L (ref 21–215)

## 2012-10-08 LAB — COMPREHENSIVE METABOLIC PANEL
Anion Gap: 8 (ref 7–16)
Bilirubin,Total: 0.2 mg/dL (ref 0.2–1.0)
Calcium, Total: 8.9 mg/dL (ref 8.5–10.1)
Chloride: 102 mmol/L (ref 98–107)
Co2: 26 mmol/L (ref 21–32)
Creatinine: 0.91 mg/dL (ref 0.60–1.30)
EGFR (African American): >60
EGFR (Non-African Amer.): >60
Osmolality: 271 (ref 275–301)
Potassium: 3.1 mmol/L — ABNORMAL LOW (ref 3.5–5.1)
Sodium: 136 mmol/L (ref 136–145)

## 2012-10-08 LAB — TROPONIN I: Troponin-I: <0.02 ng/mL

## 2012-10-08 LAB — PREGNANCY, URINE: Pregnancy Test, Urine: NEGATIVE m[IU]/mL

## 2012-10-08 LAB — MAGNESIUM: Magnesium: 1.4 mg/dL — ABNORMAL LOW

## 2013-01-29 ENCOUNTER — Emergency Department: Payer: Self-pay | Admitting: Emergency Medicine

## 2013-02-21 ENCOUNTER — Emergency Department: Payer: Self-pay | Admitting: Emergency Medicine

## 2013-03-10 ENCOUNTER — Emergency Department: Payer: Self-pay | Admitting: Emergency Medicine

## 2013-03-11 LAB — COMPREHENSIVE METABOLIC PANEL
Albumin: 3.5 g/dL (ref 3.4–5.0)
Alkaline Phosphatase: 57 U/L (ref 50–136)
Anion Gap: 2 — ABNORMAL LOW (ref 7–16)
BUN: 14 mg/dL (ref 7–18)
Chloride: 106 mmol/L (ref 98–107)
Co2: 31 mmol/L (ref 21–32)
Creatinine: 0.87 mg/dL (ref 0.60–1.30)
EGFR (Non-African Amer.): >60
Osmolality: 277 (ref 275–301)
Potassium: 3.5 mmol/L (ref 3.5–5.1)
Sodium: 139 mmol/L (ref 136–145)

## 2013-03-11 LAB — URINALYSIS, COMPLETE
Ketone: NEGATIVE
Specific Gravity: 1.023 (ref 1.003–1.030)
Squamous Epithelial: 8
WBC UR: 31 /HPF (ref 0–5)

## 2013-03-11 LAB — CBC
HCT: 33 % — ABNORMAL LOW (ref 35.0–47.0)
HGB: 10.9 g/dL — ABNORMAL LOW (ref 12.0–16.0)
MCH: 27.9 pg (ref 26.0–34.0)
WBC: 7.8 10*3/uL (ref 3.6–11.0)

## 2013-05-22 ENCOUNTER — Emergency Department: Payer: Self-pay | Admitting: Emergency Medicine

## 2013-05-22 LAB — CBC
RBC: 3.71 10*6/uL — ABNORMAL LOW (ref 3.80–5.20)
WBC: 9.2 10*3/uL (ref 3.6–11.0)

## 2013-05-23 LAB — URINALYSIS, COMPLETE
Glucose,UR: NEGATIVE mg/dL (ref 0–75)
Nitrite: NEGATIVE
Ph: 5 (ref 4.5–8.0)
Protein: NEGATIVE
Specific Gravity: 1.025 (ref 1.003–1.030)

## 2013-09-27 ENCOUNTER — Emergency Department: Payer: Self-pay | Admitting: Emergency Medicine

## 2013-09-27 LAB — COMPREHENSIVE METABOLIC PANEL
Albumin: 3.6 g/dL (ref 3.4–5.0)
Anion Gap: 1 — ABNORMAL LOW (ref 7–16)
BUN: 11 mg/dL (ref 7–18)
Bilirubin,Total: 0.2 mg/dL (ref 0.2–1.0)
Calcium, Total: 8.9 mg/dL (ref 8.5–10.1)
EGFR (African American): >60
EGFR (Non-African Amer.): >60
Osmolality: 275 (ref 275–301)
SGOT(AST): 22 U/L (ref 15–37)
SGPT (ALT): 21 U/L (ref 12–78)
Sodium: 138 mmol/L (ref 136–145)
Total Protein: 7.5 g/dL (ref 6.4–8.2)

## 2013-09-27 LAB — URINALYSIS, COMPLETE
Bilirubin,UR: NEGATIVE
Blood: NEGATIVE
Glucose,UR: NEGATIVE mg/dL (ref 0–75)
Ph: 6 (ref 4.5–8.0)
RBC,UR: 1 /HPF (ref 0–5)
Specific Gravity: 1.012 (ref 1.003–1.030)
WBC UR: 2 /HPF (ref 0–5)

## 2013-09-27 LAB — CBC WITH DIFFERENTIAL/PLATELET
Basophil %: 0.7 %
Eosinophil #: 0.1 10*3/uL (ref 0.0–0.7)
HGB: 10.8 g/dL — ABNORMAL LOW (ref 12.0–16.0)
Lymphocyte %: 32.9 %
MCV: 77 fL — ABNORMAL LOW (ref 80–100)
Monocyte %: 6.4 %
Neutrophil #: 3.3 10*3/uL (ref 1.4–6.5)
Neutrophil %: 57.8 %
Platelet: 241 10*3/uL (ref 150–440)
RBC: 4.22 10*6/uL (ref 3.80–5.20)

## 2014-02-07 ENCOUNTER — Emergency Department: Payer: Self-pay | Admitting: Emergency Medicine

## 2014-02-07 LAB — CBC WITH DIFFERENTIAL/PLATELET
BASOS ABS: 0 10*3/uL (ref 0.0–0.1)
Basophil %: 0.9 %
Eosinophil #: 0.1 10*3/uL (ref 0.0–0.7)
Eosinophil %: 1.6 %
HCT: 36.9 % (ref 35.0–47.0)
HGB: 12.3 g/dL (ref 12.0–16.0)
LYMPHS ABS: 2 10*3/uL (ref 1.0–3.6)
Lymphocyte %: 39.2 %
MCH: 27.9 pg (ref 26.0–34.0)
MCHC: 33.3 g/dL (ref 32.0–36.0)
MCV: 84 fL (ref 80–100)
MONO ABS: 0.3 x10 3/mm (ref 0.2–0.9)
MONOS PCT: 6.4 %
Neutrophil #: 2.7 10*3/uL (ref 1.4–6.5)
Neutrophil %: 51.9 %
PLATELETS: 232 10*3/uL (ref 150–440)
RBC: 4.41 10*6/uL (ref 3.80–5.20)
RDW: 16.4 % — ABNORMAL HIGH (ref 11.5–14.5)
WBC: 5.2 10*3/uL (ref 3.6–11.0)

## 2014-02-07 LAB — BASIC METABOLIC PANEL
Anion Gap: 4 — ABNORMAL LOW (ref 7–16)
BUN: 12 mg/dL (ref 7–18)
CHLORIDE: 104 mmol/L (ref 98–107)
Calcium, Total: 8.6 mg/dL (ref 8.5–10.1)
Co2: 27 mmol/L (ref 21–32)
Creatinine: 0.72 mg/dL (ref 0.60–1.30)
EGFR (Non-African Amer.): >60
Glucose: 92 mg/dL (ref 65–99)
Osmolality: 269 (ref 275–301)
Potassium: 4.3 mmol/L (ref 3.5–5.1)
SODIUM: 135 mmol/L — AB (ref 136–145)

## 2014-02-07 LAB — URINALYSIS, COMPLETE
Bilirubin,UR: NEGATIVE
Blood: NEGATIVE
GLUCOSE, UR: NEGATIVE mg/dL (ref 0–75)
KETONE: NEGATIVE
Leukocyte Esterase: NEGATIVE
Nitrite: NEGATIVE
Ph: 5 (ref 4.5–8.0)
Protein: NEGATIVE
RBC,UR: NONE SEEN /HPF (ref 0–5)
SPECIFIC GRAVITY: 1.023 (ref 1.003–1.030)
Squamous Epithelial: 1
WBC UR: 1 /HPF (ref 0–5)

## 2014-02-08 ENCOUNTER — Emergency Department: Payer: Self-pay | Admitting: Emergency Medicine

## 2014-03-28 ENCOUNTER — Emergency Department: Payer: Self-pay | Admitting: Emergency Medicine

## 2014-04-19 ENCOUNTER — Emergency Department: Payer: Self-pay | Admitting: Emergency Medicine

## 2014-04-19 LAB — COMPREHENSIVE METABOLIC PANEL
ALBUMIN: 3.2 g/dL — AB (ref 3.4–5.0)
ALT: 20 U/L (ref 12–78)
ANION GAP: 5 — AB (ref 7–16)
Alkaline Phosphatase: 52 U/L
BILIRUBIN TOTAL: 0.3 mg/dL (ref 0.2–1.0)
BUN: 8 mg/dL (ref 7–18)
CO2: 26 mmol/L (ref 21–32)
CREATININE: 0.83 mg/dL (ref 0.60–1.30)
Calcium, Total: 8.2 mg/dL — ABNORMAL LOW (ref 8.5–10.1)
Chloride: 109 mmol/L — ABNORMAL HIGH (ref 98–107)
EGFR (African American): >60
EGFR (Non-African Amer.): >60
GLUCOSE: 84 mg/dL (ref 65–99)
Osmolality: 277 (ref 275–301)
POTASSIUM: 4 mmol/L (ref 3.5–5.1)
SGOT(AST): 15 U/L (ref 15–37)
Sodium: 140 mmol/L (ref 136–145)
Total Protein: 7.1 g/dL (ref 6.4–8.2)

## 2014-04-19 LAB — CBC
HCT: 35.5 % (ref 35.0–47.0)
HGB: 11.7 g/dL — ABNORMAL LOW (ref 12.0–16.0)
MCH: 28.2 pg (ref 26.0–34.0)
MCHC: 32.8 g/dL (ref 32.0–36.0)
MCV: 86 fL (ref 80–100)
PLATELETS: 229 10*3/uL (ref 150–440)
RBC: 4.13 10*6/uL (ref 3.80–5.20)
RDW: 15.7 % — ABNORMAL HIGH (ref 11.5–14.5)
WBC: 4.9 10*3/uL (ref 3.6–11.0)

## 2014-04-19 LAB — URINALYSIS, COMPLETE
BACTERIA: NONE SEEN
Bilirubin,UR: NEGATIVE
Blood: NEGATIVE
GLUCOSE, UR: NEGATIVE mg/dL (ref 0–75)
Ketone: NEGATIVE
Leukocyte Esterase: NEGATIVE
Nitrite: NEGATIVE
PH: 7 (ref 4.5–8.0)
Protein: NEGATIVE
RBC, UR: NONE SEEN /HPF (ref 0–5)
Specific Gravity: 1.014 (ref 1.003–1.030)
Squamous Epithelial: 3
WBC UR: NONE SEEN /HPF (ref 0–5)

## 2014-09-09 ENCOUNTER — Emergency Department: Payer: Self-pay | Admitting: Emergency Medicine

## 2015-12-05 ENCOUNTER — Emergency Department
Admission: EM | Admit: 2015-12-05 | Discharge: 2015-12-05 | Disposition: A | Discharge status: Home / Self Care | Payer: PRIVATE HEALTH INSURANCE | Attending: Emergency Medicine | Admitting: Emergency Medicine

## 2015-12-05 ENCOUNTER — Emergency Department: Payer: PRIVATE HEALTH INSURANCE

## 2015-12-05 ENCOUNTER — Encounter: Payer: Self-pay | Admitting: Emergency Medicine

## 2015-12-05 DIAGNOSIS — R062 Wheezing: Secondary | ICD-10-CM

## 2015-12-05 DIAGNOSIS — M546 Pain in thoracic spine: Secondary | ICD-10-CM | POA: Insufficient documentation

## 2015-12-05 DIAGNOSIS — Z87891 Personal history of nicotine dependence: Secondary | ICD-10-CM | POA: Insufficient documentation

## 2015-12-05 DIAGNOSIS — R0602 Shortness of breath: Secondary | ICD-10-CM | POA: Diagnosis not present

## 2015-12-05 DIAGNOSIS — R079 Chest pain, unspecified: Secondary | ICD-10-CM | POA: Diagnosis not present

## 2015-12-05 DIAGNOSIS — Z79899 Other long term (current) drug therapy: Secondary | ICD-10-CM | POA: Diagnosis not present

## 2015-12-05 DIAGNOSIS — I10 Essential (primary) hypertension: Secondary | ICD-10-CM | POA: Insufficient documentation

## 2015-12-05 DIAGNOSIS — E663 Overweight: Secondary | ICD-10-CM | POA: Insufficient documentation

## 2015-12-05 LAB — BASIC METABOLIC PANEL
Anion gap: 3 — ABNORMAL LOW (ref 5–15)
BUN: 11 mg/dL (ref 6–20)
CALCIUM: 8.4 mg/dL — AB (ref 8.9–10.3)
CO2: 26 mmol/L (ref 22–32)
CREATININE: 0.69 mg/dL (ref 0.44–1.00)
Chloride: 107 mmol/L (ref 101–111)
GFR calc Af Amer: >60 mL/min (ref >60)
GFR calc non Af Amer: >60 mL/min (ref >60)
Glucose, Bld: 107 mg/dL — ABNORMAL HIGH (ref 65–99)
POTASSIUM: 3.9 mmol/L (ref 3.5–5.1)
Sodium: 136 mmol/L (ref 135–145)

## 2015-12-05 LAB — CBC
HCT: 38.6 % (ref 35.0–47.0)
Hemoglobin: 12.5 g/dL (ref 12.0–16.0)
MCH: 28 pg (ref 26.0–34.0)
MCHC: 32.5 g/dL (ref 32.0–36.0)
MCV: 86.2 fL (ref 80.0–100.0)
PLATELETS: 230 10*3/uL (ref 150–440)
RBC: 4.47 MIL/uL (ref 3.80–5.20)
RDW: 15 % — AB (ref 11.5–14.5)
WBC: 5.5 10*3/uL (ref 3.6–11.0)

## 2015-12-05 LAB — TROPONIN I

## 2015-12-05 LAB — BRAIN NATRIURETIC PEPTIDE: B Natriuretic Peptide: 30 pg/mL (ref 0.0–100.0)

## 2015-12-05 MED ORDER — IPRATROPIUM-ALBUTEROL 0.5-2.5 (3) MG/3ML IN SOLN
3.0000 mL | Freq: Once | RESPIRATORY_TRACT | Status: AC
  Administered 2015-12-05: 3 mL via RESPIRATORY_TRACT
  Filled 2015-12-05: qty 3

## 2015-12-05 MED ORDER — IOHEXOL 350 MG/ML SOLN
75.0000 mL | Freq: Once | INTRAVENOUS | Status: AC | PRN
  Administered 2015-12-05: 75 mL via INTRAVENOUS

## 2015-12-05 MED ORDER — ALBUTEROL SULFATE HFA 108 (90 BASE) MCG/ACT IN AERS: 2.0000 | INHALATION_SPRAY | RESPIRATORY_TRACT | Status: DC | PRN

## 2015-12-05 MED ORDER — ACETAMINOPHEN 500 MG PO TABS
1000.0000 mg | ORAL_TABLET | Freq: Once | ORAL | Status: AC
  Administered 2015-12-05: 1000 mg via ORAL
  Filled 2015-12-05: qty 2

## 2015-12-05 NOTE — ED Provider Notes (Signed)
[facility] Emergency Department Provider Note  ____________________________________________  Time seen: Approximately 9:32 AM  I have reviewed the triage vital signs and the nursing notes.   HISTORY  Chief Complaint Chest Pain    HPI Barbara Petersen is a 45 y.o. female who denies any medical problems or chronic medications, quit smoking 2 weeks ago, presenting with chest pain, back pain shortness of breath. Patient states that she was cooking last night when she developed a chest pain that felt like a "dull back pain" that radiated to the back associated with shortness of breath. The pain is only when she takes deep breaths. She denies any recent cough or cold symptoms, fever, chills, palpitations, lightheadedness or syncope. She has not had chest pain like this before. The pain is worse with deep breaths or with laying down. The patient has no history of CAD or MI, no personal or family history of blood clots. She does not take oral contraceptives and denies any recent long travel. No lower extremity edema or calf pain.   Past Medical History  Diagnosis Date  . Seizures (HCC)   . Hypertension    Social history: Patient quit smoking 2 weeks ago and denies cocaine abuse.  Family history: Patient reports father with MI at age 46.  There are no active problems to display for this patient.   Past Surgical History  Procedure Laterality Date  . Appendectomy    . Cholecystectomy    . Tubal ligation      Current Outpatient Rx  Name  Route  Sig  Dispense  Refill  . albuterol (PROVENTIL HFA;VENTOLIN HFA) 108 (90 Base) MCG/ACT inhaler   Inhalation   Inhale 2 puffs into the lungs every 4 (four) hours as needed for wheezing or shortness of breath.   1 Inhaler   0   . lisinopril (PRINIVIL,ZESTRIL) 20 MG tablet   Oral   Take 20 mg by mouth daily.         . phenytoin (DILANTIN) 100 MG ER capsule   Oral   Take 300 mg by mouth at bedtime.            Allergies Review of patient's allergies indicates no known allergies.  No family history on file.  Social History Social History  Substance Use Topics  . Smoking status: Former Games developer  . Smokeless tobacco: None  . Alcohol Use: Yes    Review of Systems Constitutional: No fever/chills. No lightheadedness or syncope. Eyes: No visual changes. ENT: No sore throat. Cardiovascular: Positive chest pain, without palpitations. Respiratory: Positive shortness of breath.  No cough. Gastrointestinal: No abdominal pain.  No nausea, no vomiting.  No diarrhea.  No constipation. Genitourinary: Negative for dysuria. Musculoskeletal: Positive upper back pain. Skin: Negative for rash. Neurological: Negative for headaches, focal weakness or numbness.  10-point ROS otherwise negative.  ____________________________________________   PHYSICAL EXAM:  VITAL SIGNS: ED Triage Vitals  Enc Vitals Group     BP 12/05/15 0856 152/95 mmHg     Pulse Rate 12/05/15 0834 68     Resp 12/05/15 0834 16     Temp 12/05/15 0834 98.7 F (37.1 C)     Temp Source 12/05/15 0834 Oral     SpO2 12/05/15 0834 100 %     Weight 12/05/15 0834 216 lb (97.977 kg)     Height 12/05/15 0834  (1.626 m)     Head Cir --      Peak Flow --  Pain Score 12/05/15 0836 5     Pain Loc --      Pain Edu? --      Excl. in GC? --     Constitutional: Alert and oriented. Well appearing and in no acute distress. Answer question appropriately. Eyes: Conjunctivae are normal.  EOMI. Head: Atraumatic. Nose: No congestion/rhinnorhea. Mouth/Throat: Mucous membranes are moist.  Neck: No stridor.  Supple.  No JVD. Cardiovascular: Slow rate at 58-59, regular rhythm. No murmurs, rubs or gallops.  Respiratory: Patient is mildly tachypnea but able to speak in full sentences. She is making upper respiratory noises that are eliminated when she is talking. She has some mild end expiratory wheezing in the right upper lung field. No  rales or rhonchi. No accessory muscle use or retractions. Good air exchange bilaterally. Gastrointestinal: Overweight. Soft and nontender. No distention. No peritoneal signs. Musculoskeletal: No LE edema. No palpable cords, calf tenderness to palpation or Homans sign. Neurologic:  Normal speech and language. No gross focal neurologic deficits are appreciated.  Skin:  Skin is warm, dry and intact. No rash noted. Psychiatric: Mood and affect are normal. Speech and behavior are normal.  Normal judgement.  ____________________________________________   LABS (all labs ordered are listed, but only abnormal results are displayed)  Labs Reviewed  BASIC METABOLIC PANEL - Abnormal; Notable for the following:    Glucose, Bld 107 (*)    Calcium 8.4 (*)    Anion gap 3 (*)    All other components within normal limits  CBC - Abnormal; Notable for the following:    RDW 15.0 (*)    All other components within normal limits  TROPONIN I  BRAIN NATRIURETIC PEPTIDE   ____________________________________________  EKG  ED ECG REPORT I, Rockne Menghini, the attending physician, personally viewed and interpreted this ECG.   Date: 12/05/2015  EKG Time: 825  Rate: 67  Rhythm: normal sinus rhythm  Axis: Normal  Intervals:none  ST&T Change: No ST changes. No ST elevation. No ischemic changes.  ____________________________________________  RADIOLOGY  Dg Chest 2 View  12/05/2015  CLINICAL DATA:  Chest pain, shortness of breath. EXAM: CHEST  2 VIEW COMPARISON:  October 08, 2012. FINDINGS: The heart size and mediastinal contours are within normal limits. Both lungs are clear. No pneumothorax or pleural effusion is noted. The visualized skeletal structures are unremarkable. IMPRESSION: No active cardiopulmonary disease. Electronically Signed   By: Lupita Raider, M.D.   On: 12/05/2015 09:24    ____________________________________________   PROCEDURES  Procedure(s) performed:  None  Critical Care performed: No ____________________________________________   INITIAL IMPRESSION / ASSESSMENT AND PLAN / ED COURSE  Pertinent labs & imaging results that were available during my care of the patient were reviewed by me and considered in my medical decision making (see chart for details).  45 y.o. female presenting with chest pain that radiates to the back associated with shortness of breath and worse with deep breaths. The patient has sinus rhythm on her EKG and on the monitor remains in sinus bradycardia with a rate in the high 50s. She has a mild hypertension and her blood pressure. She is not hypoxic and it appears that most of her auscultation abnormalities may be in the upper airway and resolved when she is talking. She has a chest x-ray does not show any acute abnormality, but given that her pain is significantly worse with deep breaths, I'll get a CT chest to rule out PE. ACS or MI is very unlikely given her young  age, and lack of risk factors other than smoking. Her past medical history her states that she has hypertension but the patient denies this and does not take any medication for that. Plan reevaluation after workup is complete.  ----------------------------------------- 10:27 AM on 12/05/2015 -----------------------------------------  The patient is feeling better after albuterol and Tylenol. Her labs are reassuring, she has an EKG which does not show any ischemic changes or right heart strain, and her CT chest does not show any clot. We'll plan discharge with close PMD follow-up. She understands return precautions as well as fall per instructions. ____________________________________________  FINAL CLINICAL IMPRESSION(S) / ED DIAGNOSES  Final diagnoses:  Chest pain, unspecified chest pain type  Shortness of breath  Wheezing      NEW MEDICATIONS STARTED DURING THIS VISIT:  New Prescriptions   ALBUTEROL (PROVENTIL HFA;VENTOLIN HFA) 108 (90 BASE)  MCG/ACT INHALER    Inhale 2 puffs into the lungs every 4 (four) hours as needed for wheezing or shortness of breath.     Rockne Menghini, MD 12/05/15 1027

## 2015-12-05 NOTE — ED Notes (Signed)
Patient complaining of centralized chest pain starting yesterday at 1830 while fixing dinner.  States she had "a hard time breathing".  States pain is radiating through to back.  Describes pain as "throbbing".  Denies nausea.  Denies history of similar pain.  Denies previous "heart trouble".  Patient states her face and eyes were "swollen" yesterday.

## 2015-12-05 NOTE — Discharge Instructions (Signed)
Please make an appointment with a primary care physician for follow-up. You may use albuterol if you're having wheezing or shortness of breath.  Return to the emergency department if you develop chest pain, shortness of breath, lightheadedness or fainting, fever, or any other symptoms concerning to you.

## 2015-12-05 NOTE — ED Notes (Signed)
To ED Room 8 via w/c, placed on monitor.  Report given to Curly Rim, RN.

## 2016-07-27 DIAGNOSIS — F17200 Nicotine dependence, unspecified, uncomplicated: Secondary | ICD-10-CM | POA: Insufficient documentation

## 2016-07-27 DIAGNOSIS — I10 Essential (primary) hypertension: Secondary | ICD-10-CM | POA: Insufficient documentation

## 2016-07-27 DIAGNOSIS — Z79899 Other long term (current) drug therapy: Secondary | ICD-10-CM | POA: Insufficient documentation

## 2016-07-27 DIAGNOSIS — F329 Major depressive disorder, single episode, unspecified: Secondary | ICD-10-CM | POA: Insufficient documentation

## 2016-07-28 ENCOUNTER — Encounter: Payer: Self-pay | Admitting: Emergency Medicine

## 2016-07-28 ENCOUNTER — Emergency Department
Admission: EM | Admit: 2016-07-28 | Discharge: 2016-07-28 | Disposition: A | Discharge status: Home / Self Care | Payer: PRIVATE HEALTH INSURANCE | Attending: Emergency Medicine | Admitting: Emergency Medicine

## 2016-07-28 DIAGNOSIS — F32A Depression, unspecified: Secondary | ICD-10-CM

## 2016-07-28 DIAGNOSIS — F329 Major depressive disorder, single episode, unspecified: Secondary | ICD-10-CM

## 2016-07-28 LAB — URINE DRUG SCREEN, QUALITATIVE (ARMC ONLY)
Amphetamines, Ur Screen: NOT DETECTED
Barbiturates, Ur Screen: NOT DETECTED
Benzodiazepine, Ur Scrn: NOT DETECTED
CANNABINOID 50 NG, UR ~~LOC~~: NOT DETECTED
Cocaine Metabolite,Ur ~~LOC~~: NOT DETECTED
MDMA (ECSTASY) UR SCREEN: NOT DETECTED
Methadone Scn, Ur: NOT DETECTED
Opiate, Ur Screen: NOT DETECTED
PHENCYCLIDINE (PCP) UR S: NOT DETECTED
Tricyclic, Ur Screen: NOT DETECTED

## 2016-07-28 LAB — COMPREHENSIVE METABOLIC PANEL
ALK PHOS: 54 U/L (ref 38–126)
ALT: 22 U/L (ref 14–54)
AST: 23 U/L (ref 15–41)
Albumin: 4.4 g/dL (ref 3.5–5.0)
Anion gap: 7 (ref 5–15)
BUN: 9 mg/dL (ref 6–20)
CALCIUM: 9.1 mg/dL (ref 8.9–10.3)
CO2: 27 mmol/L (ref 22–32)
CREATININE: 0.69 mg/dL (ref 0.44–1.00)
Chloride: 105 mmol/L (ref 101–111)
GFR calc Af Amer: >60 mL/min (ref >60)
Glucose, Bld: 101 mg/dL — ABNORMAL HIGH (ref 65–99)
Potassium: 4.4 mmol/L (ref 3.5–5.1)
Sodium: 139 mmol/L (ref 135–145)
TOTAL PROTEIN: 8.3 g/dL — AB (ref 6.5–8.1)

## 2016-07-28 LAB — SALICYLATE LEVEL: Salicylate Lvl: <4 mg/dL (ref 2.8–30.0)

## 2016-07-28 LAB — ACETAMINOPHEN LEVEL: Acetaminophen (Tylenol), Serum: <10 ug/mL — ABNORMAL LOW (ref 10–30)

## 2016-07-28 LAB — CBC
HCT: 39.5 % (ref 35.0–47.0)
Hemoglobin: 13.5 g/dL (ref 12.0–16.0)
MCH: 30 pg (ref 26.0–34.0)
MCHC: 34.3 g/dL (ref 32.0–36.0)
MCV: 87.5 fL (ref 80.0–100.0)
PLATELETS: 250 10*3/uL (ref 150–440)
RBC: 4.52 MIL/uL (ref 3.80–5.20)
RDW: 15 % — AB (ref 11.5–14.5)
WBC: 9.2 10*3/uL (ref 3.6–11.0)

## 2016-07-28 LAB — POCT PREGNANCY, URINE: Preg Test, Ur: NEGATIVE

## 2016-07-28 LAB — ETHANOL: ALCOHOL ETHYL (B): 107 mg/dL — AB (ref <5)

## 2016-07-28 NOTE — ED Provider Notes (Signed)
[facility] Emergency Department Provider Note    None    (approximate)  I have reviewed the triage vital signs and the nursing notes.   HISTORY  Chief Complaint Suicidal    HPI Barbara Petersen is a 45 y.o. female presents with feelings of depression today. Patient states that she had suicidal thoughts earlier however none at present. Patient denies any previous suicide attempt. Patient admits to multiple life stressors in the past few months including death of multiple family members.   Past Medical History:  Diagnosis Date  . Hypertension   . Seizures (HCC)     There are no active problems to display for this patient.   Past Surgical History:  Procedure Laterality Date  . APPENDECTOMY    . CHOLECYSTECTOMY    . TUBAL LIGATION      Prior to Admission medications   Medication Sig Start Date End Date Taking? Authorizing Provider  albuterol (PROVENTIL HFA;VENTOLIN HFA) 108 (90 Base) MCG/ACT inhaler Inhale 2 puffs into the lungs every 4 (four) hours as needed for wheezing or shortness of breath. 12/05/15   Anne-Caroline Sharma Covert, MD  lisinopril (PRINIVIL,ZESTRIL) 20 MG tablet Take 20 mg by mouth daily.    Historical Provider, MD  phenytoin (DILANTIN) 100 MG ER capsule Take 300 mg by mouth at bedtime.    Historical Provider, MD    Allergies No known drug allergies No family history on file.  Social History Social History  Substance Use Topics  . Smoking status: Current Every Day Smoker  . Smokeless tobacco: Never Used  . Alcohol use Yes     Comment: monthly    Review of Systems Constitutional: No fever/chills Eyes: No visual changes. ENT: No sore throat. Cardiovascular: Denies chest pain. Respiratory: Denies shortness of breath. Gastrointestinal: No abdominal pain.  No nausea, no vomiting.  No diarrhea.  No constipation. Genitourinary: Negative for dysuria. Musculoskeletal: Negative for back pain. Skin: Negative for  rash. Neurological: Negative for headaches, focal weakness or numbness. Psychiatric:Positive for depression  10-point ROS otherwise negative.  ____________________________________________   PHYSICAL EXAM:  VITAL SIGNS: ED Triage Vitals [07/28/16 0035]  Enc Vitals Group     BP (!) 155/89     Pulse Rate 78     Resp 18     Temp 98.3 F (36.8 C)     Temp Source Oral     SpO2 99 %     Weight 233 lb 1.6 oz (105.7 kg)     Height 5\' 4"  (1.626 m)     Head Circumference      Peak Flow      Pain Score      Pain Loc      Pain Edu?      Excl. in GC?     Constitutional: Alert and oriented. Well appearing and in no acute distress.EtOH on breath Eyes: Conjunctivae are normal. PERRL. EOMI. Head: Atraumatic. Mouth/Throat: Mucous membranes are moist.  Oropharynx non-erythematous. Neck: No stridor.  No meningeal signs.  Cardiovascular: Normal rate, regular rhythm. Good peripheral circulation. Grossly normal heart sounds. Respiratory: Normal respiratory effort.  No retractions. Lungs CTAB. Gastrointestinal: Soft and nontender. No distention.  Musculoskeletal: No lower extremity tenderness nor edema. No gross deformities of extremities. Neurologic:  Normal speech and language. No gross focal neurologic deficits are appreciated.  Skin:  Skin is warm, dry and intact. No rash noted. Psychiatric: Depressed mood. Speech and behavior are normal.  ____________________________________________   LABS (all labs ordered are listed, but  only abnormal results are displayed)  Labs Reviewed  COMPREHENSIVE METABOLIC PANEL - Abnormal; Notable for the following:       Result Value   Glucose, Bld 101 (*)    Total Protein 8.3 (*)    Total Bilirubin <0.1 (*)    All other components within normal limits  ETHANOL - Abnormal; Notable for the following:    Alcohol, Ethyl (B) 107 (*)    All other components within normal limits  ACETAMINOPHEN LEVEL - Abnormal; Notable for the following:    Acetaminophen  (Tylenol), Serum <10 (*)    All other components within normal limits  CBC - Abnormal; Notable for the following:    RDW 15.0 (*)    All other components within normal limits  SALICYLATE LEVEL  URINE DRUG SCREEN, QUALITATIVE ([facility] ONLY)  POCT PREGNANCY, URINE  POC URINE PREG, ED   ____________________________________________     Procedures    INITIAL IMPRESSION / ASSESSMENT AND PLAN / ED COURSE  Pertinent labs & imaging results that were available during my care of the patient were reviewed by me and considered in my medical decision making (see chart for details).  Patient evaluated by Dr.Breuer specialist on-call psychiatrist who recommended that the patient be discharged home with outpatient follow-up. Patient denies any suicidal ideation at present, goal-directed   Clinical Course    ____________________________________________  FINAL CLINICAL IMPRESSION(S) / ED DIAGNOSES  Final diagnoses:  Depression     MEDICATIONS GIVEN DURING THIS VISIT:  Medications - No data to display   NEW OUTPATIENT MEDICATIONS STARTED DURING THIS VISIT:  New Prescriptions   No medications on file    Modified Medications   No medications on file    Discontinued Medications   No medications on file     Note:  This document was prepared using Dragon voice recognition software and may include unintentional dictation errors.    Darci Currentandolph N Brown, MD 07/28/16 [phone]
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
## 2016-07-28 NOTE — [facility] Counselor (Signed)
TTS attempted to call pt's contact numbers to arrange transportation at the time of discharge - No answer.  Numbers called: [phone]  [phone]  [phone]   Pt was under the impression that her son was still in the ED lobby waiting area. TTS went to lobby to find pt's son - son not in the lobby waiting area.
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
## 2017-01-02 ENCOUNTER — Encounter: Payer: Self-pay | Admitting: Emergency Medicine

## 2017-01-02 ENCOUNTER — Emergency Department
Admission: EM | Admit: 2017-01-02 | Discharge: 2017-01-02 | Disposition: A | Discharge status: Home / Self Care | Payer: PRIVATE HEALTH INSURANCE | Attending: Emergency Medicine | Admitting: Emergency Medicine

## 2017-01-02 DIAGNOSIS — I10 Essential (primary) hypertension: Secondary | ICD-10-CM | POA: Insufficient documentation

## 2017-01-02 DIAGNOSIS — S29012A Strain of muscle and tendon of back wall of thorax, initial encounter: Secondary | ICD-10-CM | POA: Diagnosis not present

## 2017-01-02 DIAGNOSIS — Y990 Civilian activity done for income or pay: Secondary | ICD-10-CM | POA: Diagnosis not present

## 2017-01-02 DIAGNOSIS — Z79899 Other long term (current) drug therapy: Secondary | ICD-10-CM | POA: Insufficient documentation

## 2017-01-02 DIAGNOSIS — F17200 Nicotine dependence, unspecified, uncomplicated: Secondary | ICD-10-CM | POA: Diagnosis not present

## 2017-01-02 DIAGNOSIS — Y929 Unspecified place or not applicable: Secondary | ICD-10-CM | POA: Insufficient documentation

## 2017-01-02 DIAGNOSIS — T148XXA Other injury of unspecified body region, initial encounter: Secondary | ICD-10-CM

## 2017-01-02 DIAGNOSIS — S299XXA Unspecified injury of thorax, initial encounter: Secondary | ICD-10-CM | POA: Diagnosis present

## 2017-01-02 DIAGNOSIS — Y9389 Activity, other specified: Secondary | ICD-10-CM | POA: Insufficient documentation

## 2017-01-02 DIAGNOSIS — X503XXA Overexertion from repetitive movements, initial encounter: Secondary | ICD-10-CM | POA: Insufficient documentation

## 2017-01-02 MED ORDER — CYCLOBENZAPRINE HCL 10 MG PO TABS: 10.0000 mg | ORAL_TABLET | Freq: Once | ORAL | Status: DC

## 2017-01-02 MED ORDER — IBUPROFEN 600 MG PO TABS: 600.0000 mg | ORAL_TABLET | Freq: Three times a day (TID) | ORAL | 0 refills | Status: DC | PRN

## 2017-01-02 MED ORDER — TRAMADOL HCL 50 MG PO TABS: 50.0000 mg | ORAL_TABLET | Freq: Four times a day (QID) | ORAL | 0 refills | Status: DC | PRN

## 2017-01-02 MED ORDER — TRAMADOL HCL 50 MG PO TABS: 50.0000 mg | ORAL_TABLET | Freq: Once | ORAL | Status: DC

## 2017-01-02 MED ORDER — CYCLOBENZAPRINE HCL 10 MG PO TABS: 10.0000 mg | ORAL_TABLET | Freq: Three times a day (TID) | ORAL | 0 refills | Status: DC | PRN

## 2017-01-02 NOTE — ED Triage Notes (Signed)
Pt with low back pain started last night.

## 2017-01-02 NOTE — ED Provider Notes (Signed)
[facility] Emergency Department Provider Note   ____________________________________________   None    (approximate)  I have reviewed the triage vital signs and the nursing notes.   HISTORY  Chief Complaint Back Pain    HPI Barbara Petersen is a 46 y.o. female patient complaining of right upper back pain upon a.m. awakening. Patient denies any specific provocative incident for her complaint. Patient states that prolonged standing repetitive motion at work. Patient stated pain increases with adduction and overhead reaching. Patient denies loss of sensation.She rates the pain as 8/10. Patient described a pain as "achy". No palliative measures for this complaint. Patient is right-hand dominant.   Past Medical History:  Diagnosis Date  . Hypertension   . Seizures (HCC)     There are no active problems to display for this patient.   Past Surgical History:  Procedure Laterality Date  . APPENDECTOMY    . CHOLECYSTECTOMY    . TUBAL LIGATION      Prior to Admission medications   Medication Sig Start Date End Date Taking? Authorizing Provider  albuterol (PROVENTIL HFA;VENTOLIN HFA) 108 (90 Base) MCG/ACT inhaler Inhale 2 puffs into the lungs every 4 (four) hours as needed for wheezing or shortness of breath. 12/05/15   Anne-Caroline Sharma CovertNorman, MD  cyclobenzaprine (FLEXERIL) 10 MG tablet Take 1 tablet (10 mg total) by mouth 3 (three) times daily as needed. 01/02/17   Joni Reiningonald K Martavious Hartel, PA-C  ibuprofen (ADVIL,MOTRIN) 600 MG tablet Take 1 tablet (600 mg total) by mouth every 8 (eight) hours as needed. 01/02/17   Joni Reiningonald K Dannelle Rhymes, PA-C  lisinopril (PRINIVIL,ZESTRIL) 20 MG tablet Take 20 mg by mouth daily.    Historical Provider, MD  phenytoin (DILANTIN) 100 MG ER capsule Take 300 mg by mouth at bedtime.    Historical Provider, MD  traMADol (ULTRAM) 50 MG tablet Take 1 tablet (50 mg total) by mouth every 6 (six) hours as needed for moderate pain. 01/02/17   Joni Reiningonald K Huntington Leverich,  PA-C    Allergies Patient has no known allergies.  No family history on file.  Social History Social History  Substance Use Topics  . Smoking status: Current Every Day Smoker  . Smokeless tobacco: Never Used  . Alcohol use Yes     Comment: monthly    Review of Systems Constitutional: No fever/chills Eyes: No visual changes. ENT: No sore throat. Cardiovascular: Denies chest pain. Respiratory: Denies shortness of breath. Gastrointestinal: No abdominal pain.  No nausea, no vomiting.  No diarrhea.  No constipation. Genitourinary: Negative for dysuria. Musculoskeletal: Negative for back pain. Skin: Negative for rash. Neurological: Negative for headaches, focal weakness or numbness. Endocrine:Hypertension  ____________________________________________   PHYSICAL EXAM:  VITAL SIGNS: ED Triage Vitals [01/02/17 1610]  Enc Vitals Group     BP (!) 162/99     Pulse Rate 80     Resp 16     Temp 98.8 F (37.1 C)     Temp Source Oral     SpO2 100 %     Weight 220 lb (99.8 kg)     Height 5\' 4"  (1.626 m)     Head Circumference      Peak Flow      Pain Score 8     Pain Loc      Pain Edu?      Excl. in GC?     Constitutional: Alert and oriented. Well appearing and in no acute distress. Eyes: Conjunctivae are normal. PERRL. EOMI. Head: Atraumatic.  Nose: No congestion/rhinnorhea. Mouth/Throat: Mucous membranes are moist.  Oropharynx non-erythematous. Neck: No stridor.  No cervical spine tenderness to palpation. Hematological/Lymphatic/Immunilogical: No cervical lymphadenopathy. Cardiovascular: Normal rate, regular rhythm. Grossly normal heart sounds.  Good peripheral circulation. Elevated blood pressure Respiratory: Normal respiratory effort.  No retractions. Lungs CTAB. Gastrointestinal: Soft and nontender. No distention. No abdominal bruits. No CVA tenderness. Musculoskeletal: No obvious spinal extremity deformity. Patient has moderate guarding palpation at the trapezius  muscle group. Patient decreased range of motion with adduction and overhead reaching. Neurologic:  Normal speech and language. No gross focal neurologic deficits are appreciated. No gait instability. Skin:  Skin is warm, dry and intact. No rash noted. Psychiatric: Mood and affect are normal. Speech and behavior are normal.  ____________________________________________   LABS (all labs ordered are listed, but only abnormal results are displayed)  Labs Reviewed - No data to display ____________________________________________  EKG   ____________________________________________  RADIOLOGY   ____________________________________________   PROCEDURES  Procedure(s) performed: None  Procedures  Critical Care performed: No  ____________________________________________   INITIAL IMPRESSION / ASSESSMENT AND PLAN / ED COURSE  Pertinent labs & imaging results that were available during my care of the patient were reviewed by me and considered in my medical decision making (see chart for details).  Muscle strain. Patient given discharge Instructions. Patient given a work note. Patient given a prescription for tramadol, Flexeril, and ibuprofen. Patient advised to follow-up with the family clinic if no improvement 3 days.      ____________________________________________   FINAL CLINICAL IMPRESSION(S) / ED DIAGNOSES  Final diagnoses:  Muscle strain      NEW MEDICATIONS STARTED DURING THIS VISIT:  New Prescriptions   CYCLOBENZAPRINE (FLEXERIL) 10 MG TABLET    Take 1 tablet (10 mg total) by mouth 3 (three) times daily as needed.   IBUPROFEN (ADVIL,MOTRIN) 600 MG TABLET    Take 1 tablet (600 mg total) by mouth every 8 (eight) hours as needed.   TRAMADOL (ULTRAM) 50 MG TABLET    Take 1 tablet (50 mg total) by mouth every 6 (six) hours as needed for moderate pain.     Note:  This document was prepared using Dragon voice recognition software and may include unintentional  dictation errors.    Joni Reining, PA-C 01/02/17 1658    Charlynne Pander, MD 01/03/17 [phone]

## 2017-01-20 ENCOUNTER — Encounter: Payer: Self-pay | Admitting: Emergency Medicine

## 2017-01-20 ENCOUNTER — Emergency Department
Admission: EM | Admit: 2017-01-20 | Discharge: 2017-01-20 | Disposition: A | Discharge status: Home / Self Care | Payer: PRIVATE HEALTH INSURANCE | Attending: Emergency Medicine | Admitting: Emergency Medicine

## 2017-01-20 DIAGNOSIS — F17210 Nicotine dependence, cigarettes, uncomplicated: Secondary | ICD-10-CM | POA: Insufficient documentation

## 2017-01-20 DIAGNOSIS — J069 Acute upper respiratory infection, unspecified: Secondary | ICD-10-CM | POA: Insufficient documentation

## 2017-01-20 DIAGNOSIS — I10 Essential (primary) hypertension: Secondary | ICD-10-CM | POA: Insufficient documentation

## 2017-01-20 DIAGNOSIS — Z79899 Other long term (current) drug therapy: Secondary | ICD-10-CM | POA: Diagnosis not present

## 2017-01-20 DIAGNOSIS — R05 Cough: Secondary | ICD-10-CM | POA: Diagnosis present

## 2017-01-20 MED ORDER — LIDOCAINE VISCOUS 2 % MT SOLN
15.0000 mL | Freq: Once | OROMUCOSAL | Status: AC
  Administered 2017-01-20: 15 mL via OROMUCOSAL
  Filled 2017-01-20: qty 15

## 2017-01-20 MED ORDER — IBUPROFEN 800 MG PO TABS
800.0000 mg | ORAL_TABLET | Freq: Once | ORAL | Status: AC
  Administered 2017-01-20: 800 mg via ORAL
  Filled 2017-01-20: qty 1

## 2017-01-20 MED ORDER — BENZONATATE 100 MG PO CAPS: 100.0000 mg | ORAL_CAPSULE | Freq: Four times a day (QID) | ORAL | 0 refills | Status: DC | PRN

## 2017-01-20 MED ORDER — ALBUTEROL SULFATE HFA 108 (90 BASE) MCG/ACT IN AERS: 1.0000 | INHALATION_SPRAY | RESPIRATORY_TRACT | 0 refills | Status: DC | PRN

## 2017-01-20 NOTE — ED Notes (Signed)
See triage note  States she has had cold sx's for about 2 weeks now states cough is prod   Afebrile on arrival

## 2017-01-20 NOTE — ED Provider Notes (Signed)
[facility] Emergency Department Provider Note  ____________________________________________  Time seen: Approximately 8:06 AM  I have reviewed the triage vital signs and the nursing notes.   HISTORY  Chief Complaint URI    HPI Barbara Petersen is a 46 y.o. female , nonsmoker, with a history of HTN but no underlying pulmonary disease, presenting with cough, sore throat, generalized malaise. This started 2 weeks ago, and improves with NyQuil.She has not had any fevers that she knows of. Today she developed diffuse body aches that came to the emergency department for further evaluation. Shortness of breath, difficulty swallowing. No nausea vomiting, diarrhea, or abdominal pain. No known sick contacts.   Past Medical History:  Diagnosis Date  . Hypertension   . Seizures (HCC)     There are no active problems to display for this patient.   Past Surgical History:  Procedure Laterality Date  . APPENDECTOMY    . CHOLECYSTECTOMY    . TUBAL LIGATION      Current Outpatient Rx  . Order #: [id number] Class: Print  . Order #: [id number] Class: Print  . Order #: [id number] Class: Print  . Order #: [id number] Class: Print  . Order #: [id number] Class: Historical Med  . Order #: [id number] Class: Historical Med  . Order #: [id number] Class: Print    Allergies Patient has no known allergies.  No family history on file.  Social History Social History  Substance Use Topics  . Smoking status: Current Every Day Smoker    Types: Cigarettes  . Smokeless tobacco: Never Used  . Alcohol use Yes     Comment: monthly    Review of Systems Constitutional: No fever/chills.No lightheadedness or syncope. Positive general malaise and myalgias. Eyes: No visual changes. No eye discharge. ENT: Positive sore throat. No congestion or rhinorrhea. No ear pain. Cardiovascular: Denies chest pain. Denies palpitations. Respiratory: Denies shortness of breath.  Positive productive  cough. Gastrointestinal: No abdominal pain.  No nausea, no vomiting.  No diarrhea.  No constipation. Genitourinary: Negative for dysuria. Musculoskeletal: Negative for back pain. Skin: Negative for rash. Neurological: Negative for headaches. No focal numbness, tingling or weakness.   10-point ROS otherwise negative.  ____________________________________________   PHYSICAL EXAM:  VITAL SIGNS: ED Triage Vitals  Enc Vitals Group     BP 01/20/17 0743 (!) 169/91     Pulse Rate 01/20/17 0743 71     Resp 01/20/17 0743 18     Temp 01/20/17 0743 98.1 F (36.7 C)     Temp Source 01/20/17 0743 Oral     SpO2 01/20/17 0743 98 %     Weight 01/20/17 0744 230 lb (104.3 kg)     Height 01/20/17 0744 5\' 4"  (1.626 m)     Head Circumference --      Peak Flow --      Pain Score 01/20/17 0747 5     Pain Loc --      Pain Edu? --      Excl. in GC? --     Constitutional: Alert and oriented. Well appearing and in no acute distress. Answers questions appropriately. Eyes: Conjunctivae are normal.  EOMI. No scleral icterus.No eye discharge. Ears: TMs are clear without bulge, erythema or fluid bilaterally. The canals are clear as well. Head: Atraumatic. Nose: No congestion/rhinnorhea. Mouth/Throat: Mucous membranes are moist. I'll posterior pharyngeal erythema without tonsillar swelling or exudate. The posterior palate is symmetric and the uvula is midline. Neck: No stridor.  Supple.  No meningismus. Cardiovascular: Normal rate, regular rhythm. No murmurs,  rubs or gallops.  Respiratory: Normal respiratory effort.  There are some upper airway noises but in the lung fields, there are no wheezes rales or red rhonchi.  Good air exchange.  Speaks in full sentences. No accessory muscle use or retractions. Lungs CTAB.  No wheezes, rales or ronchi. Musculoskeletal: No LE edema.  Neurologic:  A&Ox3.  Speech is clear.  Face and smile are symmetric.  EOMI.  Moves all extremities well. Skin:  Skin is warm, dry and  intact. No rash noted. Psychiatric: Mood and affect are normal. Speech and behavior are normal.  Normal judgement.  ____________________________________________   LABS (all labs ordered are listed, but only abnormal results are displayed)  Labs Reviewed - No data to display ____________________________________________  EKG  Not indicated ____________________________________________  RADIOLOGY  No results found.  ____________________________________________   PROCEDURES  Procedure(s) performed: None  Procedures  Critical Care performed: No ____________________________________________   INITIAL IMPRESSION / ASSESSMENT AND PLAN / ED COURSE  Pertinent labs & imaging results that were available during my care of the patient were reviewed by me and considered in my medical decision making (see chart for details).  46 y.o. female presenting with 2 weeks of cough, mild sore throat, now with generalized malaise and body aches. Overall, the patient is well-appearing and has reassuring vital signs. She has no clinical evidence of pneumonia on my examination and her symptoms are more consistent with an upper respiratory infection, likely viral. Her Centor criteria are not indicative of strep pharyngitis. I will send her home with a plan for symptomatic treatment, instructions for the prevention of spread of her illness, and instructions to follow up with her primary care physician. She understands return precautions. Patient is stable for discharge at this time.  ____________________________________________  FINAL CLINICAL IMPRESSION(S) / ED DIAGNOSES  Final diagnoses:  Upper respiratory tract infection, unspecified type         NEW MEDICATIONS STARTED DURING THIS VISIT:  New Prescriptions   ALBUTEROL (PROVENTIL HFA;VENTOLIN HFA) 108 (90 BASE) MCG/ACT INHALER    Inhale 1-2 puffs into the lungs every 4 (four) hours as needed for wheezing or shortness of breath.    BENZONATATE (TESSALON PERLES) 100 MG CAPSULE    Take 1 capsule (100 mg total) by mouth every 6 (six) hours as needed for cough.      Rockne MenghiniAnne-Caroline Pritika Alvarez, MD 01/20/17 [phone]

## 2017-01-20 NOTE — Discharge Instructions (Signed)
Please drink plenty of fluid to stay well-hydrated. You may take Tylenol or Motrin for your body aches and sore throat, or if you develop fever. The albuterol inhaler can help with coughing spasms.  Please make a follow-up appointment with a primary care physician.  Return to the emergency room if you develop severe pain, shortness of breath, inability to swallow, lightheadedness or fainting, fever (temperature > 100.5), or any other symptoms concerning to you.

## 2017-01-20 NOTE — ED Triage Notes (Signed)
C/O cold symptoms x 2 week.  Today states woke up in the morning and cough is productive.

## 2017-07-08 ENCOUNTER — Emergency Department: Payer: PRIVATE HEALTH INSURANCE

## 2017-07-08 ENCOUNTER — Encounter: Payer: Self-pay | Admitting: Emergency Medicine

## 2017-07-08 ENCOUNTER — Emergency Department
Admission: EM | Admit: 2017-07-08 | Discharge: 2017-07-08 | Disposition: A | Discharge status: Home / Self Care | Payer: PRIVATE HEALTH INSURANCE | Attending: Emergency Medicine | Admitting: Emergency Medicine

## 2017-07-08 DIAGNOSIS — M25561 Pain in right knee: Secondary | ICD-10-CM | POA: Insufficient documentation

## 2017-07-08 DIAGNOSIS — F17210 Nicotine dependence, cigarettes, uncomplicated: Secondary | ICD-10-CM | POA: Insufficient documentation

## 2017-07-08 DIAGNOSIS — I10 Essential (primary) hypertension: Secondary | ICD-10-CM | POA: Insufficient documentation

## 2017-07-08 DIAGNOSIS — Z79899 Other long term (current) drug therapy: Secondary | ICD-10-CM | POA: Insufficient documentation

## 2017-07-08 MED ORDER — IBUPROFEN 600 MG PO TABS: 600.0000 mg | ORAL_TABLET | Freq: Four times a day (QID) | ORAL | 0 refills | Status: DC | PRN

## 2017-07-08 NOTE — ED Triage Notes (Signed)
Pt to ed with c/o right knee pain that started about 1 week ago, denies injury, states progressively getting worse.

## 2017-07-08 NOTE — ED Provider Notes (Signed)
[facility] Emergency Department Provider Note  ____________________________________________  Time seen: Approximately 7:54 PM  I have reviewed the triage vital signs and the nursing notes.   HISTORY  Chief Complaint Knee Pain    HPI  Barbara Petersen is a 46 y.o. female who presents to the emergency department with right knee pain for one week. Pain is primarily in the front. It does not radiate. Pain is worse when she is on her feet all day. No alleviating measures have been attempted. No trauma. No fever, chills, shortness of breath, calf pain, numbness, tingling.   Past Medical History:  Diagnosis Date  . Hypertension   . Seizures (HCC)     There are no active problems to display for this patient.   Past Surgical History:  Procedure Laterality Date  . APPENDECTOMY    . CHOLECYSTECTOMY    . TUBAL LIGATION      Prior to Admission medications   Medication Sig Start Date End Date Taking? Authorizing Provider  albuterol (PROVENTIL HFA;VENTOLIN HFA) 108 (90 Base) MCG/ACT inhaler Inhale 1-2 puffs into the lungs every 4 (four) hours as needed for wheezing or shortness of breath. 01/20/17   Rockne Menghini, MD  benzonatate (TESSALON PERLES) 100 MG capsule Take 1 capsule (100 mg total) by mouth every 6 (six) hours as needed for cough. 01/20/17   Rockne Menghini, MD  cyclobenzaprine (FLEXERIL) 10 MG tablet Take 1 tablet (10 mg total) by mouth 3 (three) times daily as needed. 01/02/17   Joni Reining, PA-C  ibuprofen (ADVIL,MOTRIN) 600 MG tablet Take 1 tablet (600 mg total) by mouth every 6 (six) hours as needed. 07/08/17   Enid Derry, PA-C  lisinopril (PRINIVIL,ZESTRIL) 20 MG tablet Take 20 mg by mouth daily.    [provider]  phenytoin (DILANTIN) 100 MG ER capsule Take 300 mg by mouth at bedtime.    [provider]  traMADol (ULTRAM) 50 MG tablet Take 1 tablet (50 mg total) by mouth every 6 (six) hours as needed for moderate  pain. 01/02/17   Joni Reining, PA-C    Allergies Patient has no known allergies.  History reviewed. No pertinent family history.  Social History Social History  Substance Use Topics  . Smoking status: Current Every Day Smoker    Types: Cigarettes  . Smokeless tobacco: Never Used  . Alcohol use Yes     Comment: monthly     Review of Systems  Constitutional: No fever/chills Cardiovascular: No chest pain. Respiratory: No SOB. Gastrointestinal: No abdominal pain.  No nausea, no vomiting.  Musculoskeletal: Positive for knee pain. Skin: Negative for rash, abrasions, lacerations, ecchymosis. Neurological: Negative for headaches, numbness or tingling   ____________________________________________   PHYSICAL EXAM:  VITAL SIGNS: ED Triage Vitals  Enc Vitals Group     BP 07/08/17 1815 (!) 162/107     Pulse Rate 07/08/17 1815 73     Resp 07/08/17 1815 18     Temp 07/08/17 1815 98.2 F (36.8 C)     Temp Source 07/08/17 1815 Oral     SpO2 07/08/17 1815 98 %     Weight 07/08/17 1813 230 lb (104.3 kg)     Height 07/08/17 1813 5\' 4"  (1.626 m)     Head Circumference --      Peak Flow --      Pain Score 07/08/17 1813 8     Pain Loc --      Pain Edu? --  Excl. in GC? --      Constitutional: Alert and oriented. Well appearing and in no acute distress. Eyes: Conjunctivae are normal. PERRL. EOMI. Head: Atraumatic. ENT:      Ears:      Nose: No congestion/rhinnorhea.      Mouth/Throat: Mucous membranes are moist.  Neck: No stridor.  Cardiovascular: Normal rate, regular rhythm.  Good peripheral circulation. Respiratory: Normal respiratory effort without tachypnea or retractions. Lungs CTAB. Good air entry to the bases with no decreased or absent breath sounds. Gastrointestinal: Bowel sounds 4 quadrants. Soft and nontender to palpation. No guarding or rigidity. No palpable masses. No distention.  Musculoskeletal: Full range of motion to all extremities. No gross  deformities appreciated. Tenderness to palpation over lateral side of patella. No effusion noted. Negative anterior drawer, posterior drawer, valgus, varus, mcMurray, patella apprehension, apley grind. Strength 5 out of 5 in lower extremities. Neurologic:  Normal speech and language. No gross focal neurologic deficits are appreciated.  Skin:  Skin is warm, dry and intact. No rash noted.    ____________________________________________   LABS (all labs ordered are listed, but only abnormal results are displayed)  Labs Reviewed - No data to display ____________________________________________  EKG   ____________________________________________  RADIOLOGY Lexine Baton, personally viewed and evaluated these images (plain radiographs) as part of my medical decision making, as well as reviewing the written report by the radiologist.  Dg Knee Complete 4 Views Right  Result Date: 07/08/2017 CLINICAL DATA:  Right knee pain. EXAM: RIGHT KNEE - COMPLETE 4+ VIEW COMPARISON:  None. FINDINGS: No evidence of fracture, dislocation, or joint effusion. No evidence of arthropathy or other focal bone abnormality. Soft tissues are unremarkable. IMPRESSION: Negative. Electronically Signed   By: Ted Mcalpine M.D.   On: 07/08/2017 19:00    ____________________________________________    PROCEDURES  Procedure(s) performed:    Procedures    Medications - No data to display   ____________________________________________   INITIAL IMPRESSION / ASSESSMENT AND PLAN / ED COURSE  Pertinent labs & imaging results that were available during my care of the patient were reviewed by me and considered in my medical decision making (see chart for details).  Review of the [place] CSRS was performed in accordance of the [facility] prior to dispensing any controlled drugs.   Patient presented to the emergency department for evaluation of right knee pain. Vital signs and exam are reassuring. The x-ray  negative for any acute bony abnormalities. Knee exam unremarkable. No alleviating measures have been attempted thus far. Patient will be discharged home with prescriptions for naproxen. Patient is to follow up with ortho as directed. Patient is given ED precautions to return to the ED for any worsening or new symptoms.     ____________________________________________  FINAL CLINICAL IMPRESSION(S) / ED DIAGNOSES  Final diagnoses:  Acute pain of right knee      NEW MEDICATIONS STARTED DURING THIS VISIT:  Discharge Medication List as of 07/08/2017  8:17 PM          This chart was dictated using voice recognition software/Dragon. Despite best efforts to proofread, errors can occur which can change the meaning. Any change was purely unintentional.    Enid Derry, PA-C 07/08/17 2349    Arnaldo Natal, MD 07/09/17 2040

## 2017-10-31 ENCOUNTER — Encounter: Payer: Self-pay | Admitting: Emergency Medicine

## 2017-10-31 ENCOUNTER — Other Ambulatory Visit: Payer: Self-pay

## 2017-10-31 ENCOUNTER — Emergency Department
Admission: EM | Admit: 2017-10-31 | Discharge: 2017-10-31 | Disposition: A | Discharge status: Home / Self Care | Payer: PRIVATE HEALTH INSURANCE | Attending: Student in an Organized Health Care Education/Training Program | Admitting: Student in an Organized Health Care Education/Training Program

## 2017-10-31 DIAGNOSIS — R569 Unspecified convulsions: Secondary | ICD-10-CM

## 2017-10-31 DIAGNOSIS — Z9049 Acquired absence of other specified parts of digestive tract: Secondary | ICD-10-CM | POA: Insufficient documentation

## 2017-10-31 DIAGNOSIS — Z79899 Other long term (current) drug therapy: Secondary | ICD-10-CM | POA: Insufficient documentation

## 2017-10-31 DIAGNOSIS — F17210 Nicotine dependence, cigarettes, uncomplicated: Secondary | ICD-10-CM | POA: Insufficient documentation

## 2017-10-31 DIAGNOSIS — I10 Essential (primary) hypertension: Secondary | ICD-10-CM | POA: Insufficient documentation

## 2017-10-31 LAB — CBC WITH DIFFERENTIAL/PLATELET
BASOS ABS: 0 10*3/uL (ref 0–0.1)
Basophils Relative: 1 %
EOS ABS: 0.1 10*3/uL (ref 0–0.7)
Eosinophils Relative: 3 %
HCT: 38.7 % (ref 35.0–47.0)
HEMOGLOBIN: 13.1 g/dL (ref 12.0–16.0)
LYMPHS ABS: 2 10*3/uL (ref 1.0–3.6)
LYMPHS PCT: 42 %
MCH: 29.7 pg (ref 26.0–34.0)
MCHC: 33.9 g/dL (ref 32.0–36.0)
MCV: 87.7 fL (ref 80.0–100.0)
Monocytes Absolute: 0.5 10*3/uL (ref 0.2–0.9)
Monocytes Relative: 11 %
NEUTROS PCT: 43 %
Neutro Abs: 2.1 10*3/uL (ref 1.4–6.5)
Platelets: 274 10*3/uL (ref 150–440)
RBC: 4.41 MIL/uL (ref 3.80–5.20)
RDW: 15.4 % — ABNORMAL HIGH (ref 11.5–14.5)
WBC: 4.8 10*3/uL (ref 3.6–11.0)

## 2017-10-31 LAB — BASIC METABOLIC PANEL
ANION GAP: 7 (ref 5–15)
BUN: 13 mg/dL (ref 6–20)
CALCIUM: 9 mg/dL (ref 8.9–10.3)
CO2: 25 mmol/L (ref 22–32)
CREATININE: 0.78 mg/dL (ref 0.44–1.00)
Chloride: 106 mmol/L (ref 101–111)
GFR calc Af Amer: >60 mL/min (ref >60)
GLUCOSE: 108 mg/dL — AB (ref 65–99)
Potassium: 4.1 mmol/L (ref 3.5–5.1)
Sodium: 138 mmol/L (ref 135–145)

## 2017-10-31 LAB — URINALYSIS, COMPLETE (UACMP) WITH MICROSCOPIC
BILIRUBIN URINE: NEGATIVE
Bacteria, UA: NONE SEEN
GLUCOSE, UA: NEGATIVE mg/dL
HGB URINE DIPSTICK: NEGATIVE
KETONES UR: NEGATIVE mg/dL
LEUKOCYTES UA: NEGATIVE
Nitrite: NEGATIVE
PH: 6 (ref 5.0–8.0)
PROTEIN: NEGATIVE mg/dL
Specific Gravity, Urine: 1.021 (ref 1.005–1.030)

## 2017-10-31 LAB — HCG, QUANTITATIVE, PREGNANCY: hCG, Beta Chain, Quant, S: <1 m[IU]/mL (ref <5)

## 2017-10-31 MED ORDER — MIDAZOLAM HCL 5 MG/5ML IJ SOLN
2.0000 mg | Freq: Once | INTRAMUSCULAR | Status: AC
  Administered 2017-10-31: 2 mg via INTRAVENOUS

## 2017-10-31 MED ORDER — MIDAZOLAM HCL 5 MG/5ML IJ SOLN
INTRAMUSCULAR | Status: AC
  Filled 2017-10-31: qty 5

## 2017-10-31 MED ORDER — MIDAZOLAM HCL 5 MG/5ML IJ SOLN: 4.0000 mg | Freq: Once | INTRAMUSCULAR | Status: DC

## 2017-10-31 NOTE — ED Notes (Signed)
Husband reports that pt has 2 seizures PTA, in triage pt had another seizure. Pt appears postictal at this time.

## 2017-10-31 NOTE — ED Provider Notes (Signed)
[facility] Emergency Department Provider Note    First MD Initiated Contact with Patient 10/31/17 1255     (approximate)  I have reviewed the triage vital signs and the nursing notes.   HISTORY  Chief Complaint Seizures    HPI Barbara Petersen is a 46 y.o. female with a history of hypertension and seizure disorder no longer on drug with no recent hospitalizations presents to the ER after having 2 witnessed seizures this morning with her husband.  She was accompanying her husband to the hospital for a clinic appointment.  While waiting the patient had generalized tonic clonic shaking movements the last lasted several minutes.  The patient was able to be calmed down and settle down the clinic told the husband to go ahead and take her home and reschedule appointment.  On the right home the patient had another shaking spell and was brought to the ER.  Husband states that both these episodes were shorter than previous seizure episodes.  States is been 2 years since she was on her Dilantin.  Denies any recent fevers.  Patient does appear anxious and frightened but in no acute distress.  She did not bite her tongue.  Did not lose control of her bladder.  Denies any headache.  Has not followed up with neurology in several years.  Past Medical History:  Diagnosis Date  . Hypertension   . Seizures (HCC)    No family history on file. Past Surgical History:  Procedure Laterality Date  . APPENDECTOMY    . CHOLECYSTECTOMY    . TUBAL LIGATION     There are no active problems to display for this patient.     Prior to Admission medications   Medication Sig Start Date End Date Taking? Authorizing Provider  albuterol (PROVENTIL HFA;VENTOLIN HFA) 108 (90 Base) MCG/ACT inhaler Inhale 1-2 puffs into the lungs every 4 (four) hours as needed for wheezing or shortness of breath. 01/20/17   Rockne Menghini, MD  benzonatate (TESSALON PERLES) 100 MG capsule Take 1 capsule (100  mg total) by mouth every 6 (six) hours as needed for cough. 01/20/17   Rockne Menghini, MD  cyclobenzaprine (FLEXERIL) 10 MG tablet Take 1 tablet (10 mg total) by mouth 3 (three) times daily as needed. 01/02/17   Joni Reining, PA-C  ibuprofen (ADVIL,MOTRIN) 600 MG tablet Take 1 tablet (600 mg total) by mouth every 6 (six) hours as needed. 07/08/17   Enid Derry, PA-C  lisinopril (PRINIVIL,ZESTRIL) 20 MG tablet Take 20 mg by mouth daily.    [provider]  phenytoin (DILANTIN) 100 MG ER capsule Take 300 mg by mouth at bedtime.    [provider]  traMADol (ULTRAM) 50 MG tablet Take 1 tablet (50 mg total) by mouth every 6 (six) hours as needed for moderate pain. 01/02/17   Joni Reining, PA-C    Allergies Patient has no known allergies.    Social History Social History   Tobacco Use  . Smoking status: Current Every Day Smoker    Types: Cigarettes  . Smokeless tobacco: Never Used  Substance Use Topics  . Alcohol use: Yes    Comment: monthly  . Drug use: No    Review of Systems Patient denies headaches, rhinorrhea, blurry vision, numbness, shortness of breath, chest pain, edema, cough, abdominal pain, nausea, vomiting, diarrhea, dysuria, fevers, rashes or hallucinations unless otherwise stated above in HPI. ____________________________________________   PHYSICAL EXAM:  VITAL SIGNS: Vitals:   10/31/17 1238  BP: (!) 155/99  Pulse: 64  Resp: 16  Temp: 98.6 F (37 C)  SpO2: 99%    Constitutional: Alert and oriented. Anxious appearing but in no acute distress. Eyes: Conjunctivae are normal.  Head: Atraumatic. Nose: No congestion/rhinnorhea. Mouth/Throat: Mucous membranes are moist.  No injury to tongue Neck: No stridor. Painless ROM.  Cardiovascular: Normal rate, regular rhythm. Grossly normal heart sounds.  Good peripheral circulation. Respiratory: Normal respiratory effort.  No retractions. Lungs CTAB. Gastrointestinal: Soft and nontender. No  distention. No abdominal bruits. No CVA tenderness. Musculoskeletal: No lower extremity tenderness nor edema.  No joint effusions. Neurologic:  CN- intact.  No facial droop, Normal FNF.  Normal heel to shin.  Sensation intact bilaterally. Normal speech and language. No gross focal neurologic deficits are appreciated.  Skin:  Skin is warm, dry and intact. No rash noted. Psychiatric: anxious appearing  ____________________________________________   LABS (all labs ordered are listed, but only abnormal results are displayed)  Results for orders placed or performed during the hospital encounter of 10/31/17 (from the past 24 hour(s))  CBC with Differential/Platelet     Status: Abnormal   Collection Time: 10/31/17 12:39 PM  Result Value Ref Range   WBC 4.8 3.6 - 11.0 K/uL   RBC 4.41 3.80 - 5.20 MIL/uL   Hemoglobin 13.1 12.0 - 16.0 g/dL   HCT 16.138.7 09.635.0 - 04.547.0 %   MCV 87.7 80.0 - 100.0 fL   MCH 29.7 26.0 - 34.0 pg   MCHC 33.9 32.0 - 36.0 g/dL   RDW 40.915.4 (H) 81.111.5 - 91.414.5 %   Platelets 274 150 - 440 K/uL   Neutrophils Relative % 43 %   Neutro Abs 2.1 1.4 - 6.5 K/uL   Lymphocytes Relative 42 %   Lymphs Abs 2.0 1.0 - 3.6 K/uL   Monocytes Relative 11 %   Monocytes Absolute 0.5 0.2 - 0.9 K/uL   Eosinophils Relative 3 %   Eosinophils Absolute 0.1 0 - 0.7 K/uL   Basophils Relative 1 %   Basophils Absolute 0.0 0 - 0.1 K/uL  Basic metabolic panel     Status: Abnormal   Collection Time: 10/31/17 12:39 PM  Result Value Ref Range   Sodium 138 135 - 145 mmol/L   Potassium 4.1 3.5 - 5.1 mmol/L   Chloride 106 101 - 111 mmol/L   CO2 25 22 - 32 mmol/L   Glucose, Bld 108 (H) 65 - 99 mg/dL   BUN 13 6 - 20 mg/dL   Creatinine, Ser 7.820.78 0.44 - 1.00 mg/dL   Calcium 9.0 8.9 - 95.610.3 mg/dL   GFR calc non Af Amer >60 >60 mL/min   GFR calc Af Amer >60 >60 mL/min   Anion gap 7 5 - 15   ____________________________________________  EKG My review and personal interpretation at Time: 12:38   Indication:  seizure like activity  Rate: 65  Rhythm: sinus Axis: normal Other: normal intervals, no stemi, no wpw or brugada ____________________________________________  RADIOLOGY  I personally reviewed all radiographic images ordered to evaluate for the above acute complaints and reviewed radiology reports and findings.  These findings were personally discussed with the patient.  Please see medical record for radiology report.  ____________________________________________   PROCEDURES  Procedure(s) performed:  Procedures    Critical Care performed: no ____________________________________________   INITIAL IMPRESSION / ASSESSMENT AND PLAN / ED COURSE  Pertinent labs & imaging results that were available during my care of the patient were reviewed by me and considered in my  medical decision making (see chart for details).  DDX: hypoglycemia, dehydration, electrolyte abn, seizure, status, PNES  Aquilla HackerValerie L Ogarro is a 46 y.o. who presents to the ED with seizure-like activity as described above.  No evidence of dysrhythmia on EKG.  Patient is well-appearing and in no acute distress.  No focal neuro deficits.  Will give dose of Versed for increasing seizure threshold while also providing some component of anxiolysis that she does appear very anxious in the ER.  Will monitor patient.  Blood work will be sent for the above differential.  Clinical Course as of Nov 01 1539  Fri Oct 31, 2017  1458 Reassessed.  Remains hemodynamically stable in no acute distress.  Patient without any metabolic acidosis or metabolic derangement.  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  Do not feel that emergent CT imaging is clinically indicated at this time.  [PR]    Clinical Course User Index [PR] Willy Eddyobinson, Grabiel Schmutz, MD    She is not pregnant and there is no evidence of UTI.  Patient able to ambulate with a steady gait.  At this point I do believe patient is  stable and appropriate for follow-up with neurology as an outpatient. ____________________________________________   FINAL CLINICAL IMPRESSION(S) / ED DIAGNOSES  Final diagnoses:  Seizure-like activity (HCC)      NEW MEDICATIONS STARTED DURING THIS VISIT:  This SmartLink is deprecated. Use AVSMEDLIST instead to display the medication list for a patient.   Note:  This document was prepared using Dragon voice recognition software and may include unintentional dictation errors.    Willy Eddyobinson, Doniven Vanpatten, MD 10/31/17 [phone]

## 2017-10-31 NOTE — ED Triage Notes (Signed)
Pt to ed via pov by husband.  Pt husband states that pt has a hx of seizures but she has not had one in 2 years.  Reports she is no longer on meds for same.  Pt husband says seizures x 2 this am.  Pt pulled out of car and has eyes open but is nonverbal.  Pt taken to triage area and pt with all over full body shaking and jerking movements consistent with seizure activity, total episode lasted about 45 seconds.  Pt then taken to room 5 for further treatment.

## 2017-10-31 NOTE — Discharge Instructions (Signed)
Do not drive or operate heavy machinery until evaluated by your PCP or neurology.

## 2018-01-14 ENCOUNTER — Encounter: Payer: Self-pay | Admitting: *Deleted

## 2018-01-14 ENCOUNTER — Other Ambulatory Visit: Payer: Self-pay

## 2018-01-14 ENCOUNTER — Emergency Department: Payer: Self-pay

## 2018-01-14 ENCOUNTER — Emergency Department
Admission: EM | Admit: 2018-01-14 | Discharge: 2018-01-14 | Disposition: A | Discharge status: Home / Self Care | Payer: Self-pay | Attending: Emergency Medicine | Admitting: Emergency Medicine

## 2018-01-14 DIAGNOSIS — Z79899 Other long term (current) drug therapy: Secondary | ICD-10-CM | POA: Insufficient documentation

## 2018-01-14 DIAGNOSIS — F17210 Nicotine dependence, cigarettes, uncomplicated: Secondary | ICD-10-CM | POA: Insufficient documentation

## 2018-01-14 DIAGNOSIS — R05 Cough: Secondary | ICD-10-CM | POA: Insufficient documentation

## 2018-01-14 DIAGNOSIS — R110 Nausea: Secondary | ICD-10-CM | POA: Insufficient documentation

## 2018-01-14 DIAGNOSIS — M7918 Myalgia, other site: Secondary | ICD-10-CM | POA: Insufficient documentation

## 2018-01-14 DIAGNOSIS — R6889 Other general symptoms and signs: Secondary | ICD-10-CM | POA: Insufficient documentation

## 2018-01-14 DIAGNOSIS — I10 Essential (primary) hypertension: Secondary | ICD-10-CM | POA: Insufficient documentation

## 2018-01-14 LAB — CBC WITH DIFFERENTIAL/PLATELET
Basophils Absolute: 0 10*3/uL (ref 0–0.1)
Basophils Relative: 1 %
Eosinophils Absolute: 0.1 10*3/uL (ref 0–0.7)
Eosinophils Relative: 2 %
HEMATOCRIT: 41 % (ref 35.0–47.0)
Hemoglobin: 13.5 g/dL (ref 12.0–16.0)
Lymphocytes Relative: 42 %
Lymphs Abs: 2.4 10*3/uL (ref 1.0–3.6)
MCH: 28.8 pg (ref 26.0–34.0)
MCHC: 33 g/dL (ref 32.0–36.0)
MCV: 87.3 fL (ref 80.0–100.0)
MONO ABS: 0.5 10*3/uL (ref 0.2–0.9)
MONOS PCT: 9 %
Neutro Abs: 2.7 10*3/uL (ref 1.4–6.5)
Neutrophils Relative %: 46 %
Platelets: 277 10*3/uL (ref 150–440)
RBC: 4.69 MIL/uL (ref 3.80–5.20)
RDW: 14.2 % (ref 11.5–14.5)
WBC: 5.7 10*3/uL (ref 3.6–11.0)

## 2018-01-14 MED ORDER — ONDANSETRON HCL 4 MG PO TABS: 4.0000 mg | ORAL_TABLET | Freq: Three times a day (TID) | ORAL | 0 refills | Status: DC | PRN

## 2018-01-14 NOTE — ED Notes (Signed)
MD at bedside. 

## 2018-01-14 NOTE — Discharge Instructions (Signed)
Please seek medical attention for any high fevers, chest pain, shortness of breath, change in behavior, persistent vomiting, bloody stool or any other new or concerning symptoms.  

## 2018-01-14 NOTE — ED Provider Notes (Signed)
[facility] Emergency Department Provider Note  ____________________________________________   I have reviewed the triage vital signs and the nursing notes.   HISTORY  Chief Complaint Fever and Influenza   History limited by: Not Limited   HPI Barbara Petersen is a 47 y.o. female who presents to the emergency department today because of concern for flu like symptoms. Patients symptoms started two days ago. Patient did have fevers. Has been having body aches. Had a little cough. Was sent home from work yesterday. The patient denies any fevers today. Has had some nausea but no vomiting. Has been taking tylenol without any significant relief.  Per medical record review patient has a history of hypertension, seizures.   Past Medical History:  Diagnosis Date  . Hypertension   . Seizures (HCC)     There are no active problems to display for this patient.   Past Surgical History:  Procedure Laterality Date  . APPENDECTOMY    . CHOLECYSTECTOMY    . TUBAL LIGATION      Prior to Admission medications   Medication Sig Start Date End Date Taking? Authorizing Provider  albuterol (PROVENTIL HFA;VENTOLIN HFA) 108 (90 Base) MCG/ACT inhaler Inhale 1-2 puffs into the lungs every 4 (four) hours as needed for wheezing or shortness of breath. 01/20/17   Rockne Menghini, MD  benzonatate (TESSALON PERLES) 100 MG capsule Take 1 capsule (100 mg total) by mouth every 6 (six) hours as needed for cough. 01/20/17   Rockne Menghini, MD  cyclobenzaprine (FLEXERIL) 10 MG tablet Take 1 tablet (10 mg total) by mouth 3 (three) times daily as needed. 01/02/17   Joni Reining, PA-C  ibuprofen (ADVIL,MOTRIN) 600 MG tablet Take 1 tablet (600 mg total) by mouth every 6 (six) hours as needed. 07/08/17   Enid Derry, PA-C  lisinopril (PRINIVIL,ZESTRIL) 20 MG tablet Take 20 mg by mouth daily.    [provider]  phenytoin (DILANTIN) 100 MG ER capsule Take 300 mg by mouth at  bedtime.    [provider]  traMADol (ULTRAM) 50 MG tablet Take 1 tablet (50 mg total) by mouth every 6 (six) hours as needed for moderate pain. 01/02/17   Joni Reining, PA-C    Allergies Patient has no known allergies.  History reviewed. No pertinent family history.  Social History Social History   Tobacco Use  . Smoking status: Current Every Day Smoker    Types: Cigarettes  . Smokeless tobacco: Never Used  Substance Use Topics  . Alcohol use: Yes    Comment: monthly  . Drug use: No    Review of Systems Constitutional: Positive for fevers. Eyes: No visual changes. ENT: No sore throat. Cardiovascular: Denies chest pain. Respiratory: Denies shortness of breath. Gastrointestinal: No abdominal pain.  No nausea, no vomiting.  No diarrhea.   Genitourinary: Negative for dysuria. Musculoskeletal: Positive for body aches. Skin: Negative for rash. Neurological: Negative for headaches, focal weakness or numbness.  ____________________________________________   PHYSICAL EXAM:  VITAL SIGNS: ED Triage Vitals  Enc Vitals Group     BP 01/14/18 1725 (!) 137/110     Pulse Rate 01/14/18 1725 68     Resp 01/14/18 1725 16     Temp 01/14/18 1725 98.7 F (37.1 C)     Temp Source 01/14/18 1725 Oral     SpO2 01/14/18 1725 98 %     Weight 01/14/18 1726 230 lb (104.3 kg)     Height 01/14/18 1726 5\' 4"  (1.626 m)  Head Circumference --      Peak Flow --      Pain Score 01/14/18 1726 6   Constitutional: Alert and oriented. Well appearing and in no distress. Eyes: Conjunctivae are normal.  ENT   Head: Normocephalic and atraumatic.   Nose: No congestion/rhinnorhea.   Mouth/Throat: Mucous membranes are moist.   Neck: No stridor. Hematological/Lymphatic/Immunilogical: No cervical lymphadenopathy. Cardiovascular: Normal rate, regular rhythm.  No murmurs, rubs, or gallops.  Respiratory: Normal respiratory effort without tachypnea nor retractions. Breath sounds  are clear and equal bilaterally. No wheezes/rales/rhonchi. Gastrointestinal: Soft and non tender. No rebound. No guarding.  Genitourinary: Deferred Musculoskeletal: Normal range of motion in all extremities. No lower extremity edema. Neurologic:  Normal speech and language. No gross focal neurologic deficits are appreciated.  Skin:  Skin is warm, dry and intact. No rash noted. Psychiatric: Mood and affect are normal. Speech and behavior are normal. Patient exhibits appropriate insight and judgment.  ____________________________________________    LABS (pertinent positives/negatives)  CBC wnl   ____________________________________________   EKG  None  ____________________________________________    RADIOLOGY  CXR No acute disease  ____________________________________________   PROCEDURES  Procedures  ____________________________________________   INITIAL IMPRESSION / ASSESSMENT AND PLAN / ED COURSE  Pertinent labs & imaging results that were available during my care of the patient were reviewed by me and considered in my medical decision making (see chart for details).  Patient presented to the emergency department today because of flulike symptoms.  Exam is benign.  Do think patient likely suffering from viral URI.  Discussed expected course of disease with patient.  Given that she has had some nausea will give patient prescription for Zofran.  ____________________________________________   FINAL CLINICAL IMPRESSION(S) / ED DIAGNOSES  Final diagnoses:  Flu-like symptoms     Note: This dictation was prepared with Dragon dictation. Any transcriptional errors that result from this process are unintentional     Phineas SemenGoodman, Crawford Tamura, MD 01/14/18 [phone]

## 2018-01-14 NOTE — ED Notes (Signed)
Informed RN that patient has been roomed and is ready for evaluation.  Patient in NAD at this time and call bell placed within reach.   

## 2018-01-14 NOTE — ED Triage Notes (Signed)
Pt c/o flu sxs since Monday, last reported fever was yesterday @ 100.5 F. Pt has been taking tylenol q 4-6 hrs for sxs. Pt denies worsening condition but states she is no better.  Pt endorses intermittent hypertension hx but does not take medication for this. Both checks of her BP were consistent and hypertensive. Pt encouraged to f/u w/ PCP regarding her HtN, today.

## 2018-04-03 ENCOUNTER — Emergency Department: Payer: Self-pay

## 2018-04-03 ENCOUNTER — Emergency Department
Admission: EM | Admit: 2018-04-03 | Discharge: 2018-04-03 | Disposition: A | Discharge status: Home / Self Care | Payer: Self-pay | Attending: Emergency Medicine | Admitting: Emergency Medicine

## 2018-04-03 DIAGNOSIS — Z79899 Other long term (current) drug therapy: Secondary | ICD-10-CM | POA: Insufficient documentation

## 2018-04-03 DIAGNOSIS — M25511 Pain in right shoulder: Secondary | ICD-10-CM | POA: Insufficient documentation

## 2018-04-03 DIAGNOSIS — M62838 Other muscle spasm: Secondary | ICD-10-CM | POA: Insufficient documentation

## 2018-04-03 DIAGNOSIS — F17210 Nicotine dependence, cigarettes, uncomplicated: Secondary | ICD-10-CM | POA: Insufficient documentation

## 2018-04-03 DIAGNOSIS — I10 Essential (primary) hypertension: Secondary | ICD-10-CM | POA: Insufficient documentation

## 2018-04-03 LAB — CBC
HCT: 36.8 % (ref 35.0–47.0)
Hemoglobin: 12.3 g/dL (ref 12.0–16.0)
MCH: 29.2 pg (ref 26.0–34.0)
MCHC: 33.5 g/dL (ref 32.0–36.0)
MCV: 86.9 fL (ref 80.0–100.0)
Platelets: 271 10*3/uL (ref 150–440)
RBC: 4.23 MIL/uL (ref 3.80–5.20)
RDW: 15.1 % — ABNORMAL HIGH (ref 11.5–14.5)
WBC: 6 10*3/uL (ref 3.6–11.0)

## 2018-04-03 LAB — TROPONIN I

## 2018-04-03 LAB — BASIC METABOLIC PANEL
ANION GAP: 6 (ref 5–15)
BUN: 15 mg/dL (ref 6–20)
CALCIUM: 8.4 mg/dL — AB (ref 8.9–10.3)
CO2: 24 mmol/L (ref 22–32)
Chloride: 106 mmol/L (ref 101–111)
Creatinine, Ser: 0.77 mg/dL (ref 0.44–1.00)
Glucose, Bld: 102 mg/dL — ABNORMAL HIGH (ref 65–99)
Potassium: 3.9 mmol/L (ref 3.5–5.1)
Sodium: 136 mmol/L (ref 135–145)

## 2018-04-03 MED ORDER — NAPROXEN 500 MG PO TABS: 500.0000 mg | ORAL_TABLET | Freq: Two times a day (BID) | ORAL | 0 refills | Status: DC

## 2018-04-03 MED ORDER — ACETAMINOPHEN 325 MG PO TABS: 650.0000 mg | ORAL_TABLET | Freq: Four times a day (QID) | ORAL | 0 refills | Status: DC | PRN

## 2018-04-03 NOTE — ED Notes (Signed)
Topaz not functioning properly.

## 2018-04-03 NOTE — ED Provider Notes (Signed)
[facility] Emergency Department Provider Note  ____________________________________________  Time seen: Approximately 7:31 AM  I have reviewed the triage vital signs and the nursing notes.   HISTORY  Chief Complaint Shoulder Pain    HPI Barbara Petersen is a 47 y.o. female who complains of gradual onset of right posterior shoulder pain, constant for the past week. Worse with changes in position. No alleviating factors. Denies any sudden injuries or falls. Described as a dull ache. No associated chest pain or shortness of breath. No cough fever or chills. no arm weakness or paresthesia     Past Medical History:  Diagnosis Date  . Hypertension   . Seizures (HCC)      There are no active problems to display for this patient.    Past Surgical History:  Procedure Laterality Date  . APPENDECTOMY    . CHOLECYSTECTOMY    . TUBAL LIGATION       Prior to Admission medications   Medication Sig Start Date End Date Taking? Authorizing Provider  acetaminophen (TYLENOL) 325 MG tablet Take 2 tablets (650 mg total) by mouth every 6 (six) hours as needed. 04/03/18   Sharman Cheek, MD  albuterol (PROVENTIL HFA;VENTOLIN HFA) 108 (90 Base) MCG/ACT inhaler Inhale 1-2 puffs into the lungs every 4 (four) hours as needed for wheezing or shortness of breath. 01/20/17   Rockne Menghini, MD  benzonatate (TESSALON PERLES) 100 MG capsule Take 1 capsule (100 mg total) by mouth every 6 (six) hours as needed for cough. 01/20/17   Rockne Menghini, MD  cyclobenzaprine (FLEXERIL) 10 MG tablet Take 1 tablet (10 mg total) by mouth 3 (three) times daily as needed. 01/02/17   Joni Reining, PA-C  ibuprofen (ADVIL,MOTRIN) 600 MG tablet Take 1 tablet (600 mg total) by mouth every 6 (six) hours as needed. 07/08/17   Enid Derry, PA-C  lisinopril (PRINIVIL,ZESTRIL) 20 MG tablet Take 20 mg by mouth daily.    [provider]  naproxen (NAPROSYN) 500 MG tablet Take 1  tablet (500 mg total) by mouth 2 (two) times daily with a meal. 04/03/18   Sharman Cheek, MD  ondansetron (ZOFRAN) 4 MG tablet Take 1 tablet (4 mg total) by mouth every 8 (eight) hours as needed for nausea or vomiting. 01/14/18   Phineas Semen, MD  phenytoin (DILANTIN) 100 MG ER capsule Take 300 mg by mouth at bedtime.    [provider]  traMADol (ULTRAM) 50 MG tablet Take 1 tablet (50 mg total) by mouth every 6 (six) hours as needed for moderate pain. 01/02/17   Joni Reining, PA-C     Allergies Patient has no known allergies.   No family history on file.  Social History Social History   Tobacco Use  . Smoking status: Current Every Day Smoker    Types: Cigarettes  . Smokeless tobacco: Never Used  Substance Use Topics  . Alcohol use: Yes    Comment: monthly  . Drug use: No    Review of Systems  Constitutional:   No fever or chills.  ENT:   No sore throat. No rhinorrhea. Cardiovascular:   No chest pain or syncope. Respiratory:   No dyspnea or cough. Gastrointestinal:   Negative for abdominal pain, vomiting and diarrhea.  Musculoskeletal:   right shoulder pain as above All other systems reviewed and are negative except as documented above in ROS and HPI.  ____________________________________________   PHYSICAL EXAM:  VITAL SIGNS: ED Triage Vitals  Enc Vitals Group  BP 04/03/18 0612 (!) 177/97     Pulse Rate 04/03/18 0612 92     Resp 04/03/18 0612 17     Temp 04/03/18 0612 98 F (36.7 C)     Temp Source 04/03/18 0612 Oral     SpO2 04/03/18 0612 98 %     Weight 04/03/18 0613 240 lb (108.9 kg)     Height 04/03/18 0613  (1.626 m)     Head Circumference --      Peak Flow --      Pain Score 04/03/18 0613 8     Pain Loc --      Pain Edu? --      Excl. in GC? --     Vital signs reviewed, nursing assessments reviewed.   Constitutional:   Alert and oriented. Well appearing and in no distress. Eyes:   Conjunctivae are normal. EOMI.  PERRL. ENT      Head:   Normocephalic and atraumatic.      Nose:   No congestion/rhinnorhea.       Mouth/Throat:   MMM, no pharyngeal erythema. No peritonsillar mass.       Neck:   No meningismus. Full ROM.  Nonpalpable Hematological/Lymphatic/Immunilogical:   No cervical lymphadenopathy. Cardiovascular:   RRR. Symmetric bilateral radial and DP pulses.  No murmurs.  Respiratory:   Normal respiratory effort without tachypnea/retractions. Breath sounds are clear and equal bilaterally. No wheezes/rales/rhonchi. Gastrointestinal:   Soft and nontender. Non distended. There is no CVA tenderness.  No rebound, rigidity, or guarding.  Musculoskeletal:   Normal range of motion in all extremities. No joint effusions.  No lower extremity tenderness.  No edema.no midline spinal tenderness. Focal tenderness over the right lateral border of the trapezius muscle is tense. This reproduces her symptoms. No focal bony tenderness. Shoulder joint is unremarkable. No inflammatory soft tissue changes, no evidence of cellulitis or abscess septic arthritis or osteomyelitis Neurologic:   Normal speech and language.  Motor grossly intact. No acute focal neurologic deficits are appreciated.  Skin:    Skin is warm, dry and intact. No rash noted.  No petechiae, purpura, or bullae.no crepitus  ____________________________________________    LABS (pertinent positives/negatives) (all labs ordered are listed, but only abnormal results are displayed) Labs Reviewed  BASIC METABOLIC PANEL - Abnormal; Notable for the following components:      Result Value   Glucose, Bld 102 (*)    Calcium 8.4 (*)    All other components within normal limits  CBC - Abnormal; Notable for the following components:   RDW 15.1 (*)    All other components within normal limits  TROPONIN I  POC URINE PREG, ED   ____________________________________________   EKG  interpreted by me Normal sinus rhythm rate of 88, normal axis and  intervals. Normal QRS and ST segments. Isolated T-wave inversion in lead 3 which is nonspecific, slight and nonacute.  ____________________________________________    RADIOLOGY  Dg Chest 2 View  Result Date: 04/03/2018 CLINICAL DATA:  Subacute onset of right shoulder pain. EXAM: CHEST - 2 VIEW COMPARISON:  Chest radiograph performed 01/14/2018 FINDINGS: The lungs are well-aerated. Pulmonary vascularity is at the upper limits of normal. There is no evidence of focal opacification, pleural effusion or pneumothorax. The heart is normal in size; the mediastinal contour is within normal limits. No acute osseous abnormalities are seen. The visualized portions of the right shoulder are unremarkable in appearance. IMPRESSION: No acute cardiopulmonary process seen. Electronically Signed   By: Leotis Shames  Chang M.D.   On: 04/03/2018 06:58    ____________________________________________   PROCEDURES Procedures  ____________________________________________    CLINICAL IMPRESSION / ASSESSMENT AND PLAN / ED COURSE  Pertinent labs & imaging results that were available during my care of the patient were reviewed by me and considered in my medical decision making (see chart for details).    patient well-appearing no acute distress, presents with unremarkable vital signs except for chronic hypertension. Right shoulder pain clearly musculoskeletal. Considering the patient's symptoms, medical history, and physical examination today, I have low suspicion for ACS, PE, TAD, pneumothorax, carditis, mediastinitis, pneumonia, CHF, or sepsis.No evidence of infectious process. Initiate treatment with NSAIDs, heat therapy, shoulder range of motion. If not improving, advised patient to follow up with orthopedics. Based on patient's report of her own blood pressure monitoring at home using an automated cuff, it is not clear that she has chronic essential hypertension as her blood pressures are sometimes close to normal.  Unclear the role of her acute musculoskeletal pain in the elevation of her blood pressure today. I advised her to follow up with primary care in 1 week when her symptoms are better for blood pressure recheck.        ____________________________________________   FINAL CLINICAL IMPRESSION(S) / ED DIAGNOSES    Final diagnoses:  Acute pain of right shoulder  Trapezius muscle spasm     ED Discharge Orders        Ordered    naproxen (NAPROSYN) 500 MG tablet  2 times daily with meals     04/03/18 0731    acetaminophen (TYLENOL) 325 MG tablet  Every 6 hours PRN     04/03/18 0731      Portions of this note were generated with dragon dictation software. Dictation errors may occur despite best attempts at proofreading.    Sharman Cheek, MD 04/03/18 [phone]

## 2018-04-03 NOTE — ED Triage Notes (Signed)
Patient c/o right shoulder pain radiating to back X 1 week.

## 2018-04-27 ENCOUNTER — Observation Stay
Admission: EM | Admit: 2018-04-27 | Discharge: 2018-04-28 | Disposition: A | Discharge status: Home / Self Care | Payer: Self-pay | Attending: Internal Medicine | Admitting: Internal Medicine

## 2018-04-27 ENCOUNTER — Emergency Department: Payer: Self-pay

## 2018-04-27 ENCOUNTER — Other Ambulatory Visit: Payer: Self-pay

## 2018-04-27 DIAGNOSIS — R471 Dysarthria and anarthria: Secondary | ICD-10-CM | POA: Insufficient documentation

## 2018-04-27 DIAGNOSIS — I639 Cerebral infarction, unspecified: Secondary | ICD-10-CM

## 2018-04-27 DIAGNOSIS — Z6841 Body Mass Index (BMI) 40.0 and over, adult: Secondary | ICD-10-CM | POA: Insufficient documentation

## 2018-04-27 DIAGNOSIS — I10 Essential (primary) hypertension: Secondary | ICD-10-CM | POA: Insufficient documentation

## 2018-04-27 DIAGNOSIS — R4701 Aphasia: Secondary | ICD-10-CM | POA: Diagnosis present

## 2018-04-27 DIAGNOSIS — R531 Weakness: Principal | ICD-10-CM | POA: Insufficient documentation

## 2018-04-27 DIAGNOSIS — F17210 Nicotine dependence, cigarettes, uncomplicated: Secondary | ICD-10-CM | POA: Insufficient documentation

## 2018-04-27 LAB — URINALYSIS, COMPLETE (UACMP) WITH MICROSCOPIC
BACTERIA UA: NONE SEEN
Bilirubin Urine: NEGATIVE
Glucose, UA: NEGATIVE mg/dL
HGB URINE DIPSTICK: NEGATIVE
Ketones, ur: NEGATIVE mg/dL
Leukocytes, UA: NEGATIVE
NITRITE: NEGATIVE
PROTEIN: NEGATIVE mg/dL
SPECIFIC GRAVITY, URINE: 1.016 (ref 1.005–1.030)
pH: 7 (ref 5.0–8.0)

## 2018-04-27 LAB — CBC WITH DIFFERENTIAL/PLATELET
BASOS ABS: 0.1 10*3/uL (ref 0–0.1)
Basophils Relative: 1 %
Eosinophils Absolute: 0.2 10*3/uL (ref 0–0.7)
Eosinophils Relative: 3 %
HCT: 38.3 % (ref 35.0–47.0)
Hemoglobin: 13 g/dL (ref 12.0–16.0)
LYMPHS ABS: 3.1 10*3/uL (ref 1.0–3.6)
LYMPHS PCT: 43 %
MCH: 29.7 pg (ref 26.0–34.0)
MCHC: 33.9 g/dL (ref 32.0–36.0)
MCV: 87.5 fL (ref 80.0–100.0)
Monocytes Absolute: 0.7 10*3/uL (ref 0.2–0.9)
Monocytes Relative: 10 %
Neutro Abs: 3 10*3/uL (ref 1.4–6.5)
Neutrophils Relative %: 43 %
Platelets: 283 10*3/uL (ref 150–440)
RBC: 4.37 MIL/uL (ref 3.80–5.20)
RDW: 15.2 % — ABNORMAL HIGH (ref 11.5–14.5)
WBC: 7 10*3/uL (ref 3.6–11.0)

## 2018-04-27 LAB — COMPREHENSIVE METABOLIC PANEL
ALK PHOS: 56 U/L (ref 38–126)
ALT: 18 U/L (ref 14–54)
AST: 24 U/L (ref 15–41)
Albumin: 4.1 g/dL (ref 3.5–5.0)
Anion gap: 8 (ref 5–15)
BUN: 13 mg/dL (ref 6–20)
CALCIUM: 8.7 mg/dL — AB (ref 8.9–10.3)
CHLORIDE: 104 mmol/L (ref 101–111)
CO2: 25 mmol/L (ref 22–32)
Creatinine, Ser: 0.86 mg/dL (ref 0.44–1.00)
Glucose, Bld: 88 mg/dL (ref 65–99)
Potassium: 3.9 mmol/L (ref 3.5–5.1)
Sodium: 137 mmol/L (ref 135–145)
Total Bilirubin: 0.4 mg/dL (ref 0.3–1.2)
Total Protein: 8 g/dL (ref 6.5–8.1)

## 2018-04-27 LAB — TROPONIN I

## 2018-04-27 NOTE — Progress Notes (Signed)
CODE STROKE- PHARMACY COMMUNICATION   Time CODE STROKE called/page received:06/10 2317  Time response to CODE STROKE was made (in person or via phone): 06/10 2344  Time Stroke Kit retrieved from [facility] (only if needed):  Name of Provider/Nurse contacted: Attempted to call RN x1, not connected. Will await further communication from ED.  Past Medical History:  Diagnosis Date  . Hypertension   . Seizures ([place])    Prior to Admission medications   Medication Sig Start Date End Date Taking? Authorizing Provider  acetaminophen (TYLENOL) 325 MG tablet Take 2 tablets (650 mg total) by mouth every 6 (six) hours as needed. 04/03/18   Carrie Mew, MD  albuterol (PROVENTIL HFA;VENTOLIN HFA) 108 (90 Base) MCG/ACT inhaler Inhale 1-2 puffs into the lungs every 4 (four) hours as needed for wheezing or shortness of breath. 01/20/17   Eula Listen, MD  benzonatate (TESSALON PERLES) 100 MG capsule Take 1 capsule (100 mg total) by mouth every 6 (six) hours as needed for cough. 01/20/17   Eula Listen, MD  cyclobenzaprine (FLEXERIL) 10 MG tablet Take 1 tablet (10 mg total) by mouth 3 (three) times daily as needed. 01/02/17   Sable Feil, PA-C  ibuprofen (ADVIL,MOTRIN) 600 MG tablet Take 1 tablet (600 mg total) by mouth every 6 (six) hours as needed. 07/08/17   Laban Emperor, PA-C  lisinopril (PRINIVIL,ZESTRIL) 20 MG tablet Take 20 mg by mouth daily.    [provider]  naproxen (NAPROSYN) 500 MG tablet Take 1 tablet (500 mg total) by mouth 2 (two) times daily with a meal. 04/03/18   Carrie Mew, MD  ondansetron (ZOFRAN) 4 MG tablet Take 1 tablet (4 mg total) by mouth every 8 (eight) hours as needed for nausea or vomiting. 01/14/18   Nance Pear, MD  phenytoin (DILANTIN) 100 MG ER capsule Take 300 mg by mouth at bedtime.    [provider]  traMADol (ULTRAM) 50 MG tablet Take 1 tablet (50 mg total) by mouth every 6 (six) hours as needed for moderate pain. 01/02/17    Sable Feil, PA-C    Eloise Harman ,PharmD Clinical Pharmacist  04/27/2018  11:44 PM

## 2018-04-27 NOTE — Consult Note (Addendum)
[facility] TeleNeurology Consult Services  Date of service: 04/27/18  Impression:  Acute onset mild expressive with L sided weakness - atypical presentation of L hemiparesis and language dysfunction. DDx B stroke vs complicated migraine vs conversion. The risks/benefits/alternatives of IV tpa, including a 6 percent risk of brain bleed and 3 percent risk of death, was reviewed with the patient, and she declined IV tpa. She acknowledges understanding the current difficulty with speaking may be permanent without intervention.  - - -   Not a tpa candidate due to: declined by the patient.  Presentation is not suggestive of Large Vessel Occlusive Disease. Thrombectomy would not be recommended.   Differential Diagnosis:   1. Cardioembolic stroke  2. Small vessel disease/lacune  3. Thromboembolic, artery-to-artery mechanism  4. Hypercoagulable state-related infarct  5. Transient ischemic attack  6. Thrombotic mechanism, large artery disease   Comments:   Door time: 2159 [facility] contacted: 2325 [facility] at bedside: 2330 NIHSS assessment time: 2339  Recommendations:  Tele ASA if CT head is neg for hemorrhage - neg per my read. DVT proph - lovenox permissive htn PT/OT/speech bedside swallow eval  Inpatient neurology consultation Inpatient stroke evaluation as per Neurology/ Internal Medicine Discussed with ED MD Please call with questions  -----------------------------------------------------------------------------------------  CC difficulty speaking.   History of Present Illness   Patient is a 47 yo woman with acute onset difficulty speaking starting at approx 2100. She has trouble with word finding. It's improved but not resolved. No trouble understanding language. She feels mild L arm/leg weakness. She takes no blood thinners.   Diagnostic: CT head neg for hemorrhage per my read.   Exam: RESULT SUMMARY: 3 points NIH Stroke Scale   INPUTS: 1A:  Level of consciousness -> 0 = Alert; keenly responsive 1B: Ask month and age -> 0 = Both questions right 1C: 'Blink eyes' & 'squeeze hands' -> 0 = Performs both tasks 2: Horizontal extraocular movements -> 0 = Normal 3: Visual fields -> 0 = No visual loss 4: Facial palsy -> 0 = Normal symmetry 5A: Left arm motor drift -> 0 = No drift for 10 seconds 5B: Right arm motor drift -> 0 = No drift for 10 seconds 6A: Left leg motor drift -> 1 = Drift, but doesn't hit bed 6B: Right leg motor drift -> 0 = No drift for 5 seconds 7: Limb Ataxia -> 0 = No ataxia 8: Sensation -> 1 = Mild-moderate loss: less sharp/more dull  9: Language/aphasia -> 1 = Mild-moderate aphasia: some obvious changes, without significant limitation 10: Dysarthria -> 0 = Normal 11: Extinction/inattention -> 0 = No abnormality    Medical Decision Making:  - Extensive number of diagnosis or management options are considered above.   - Extensive amount of complex data reviewed.   - High risk of complication and/or morbidity or mortality are associated with differential diagnostic considerations above.  - There may be Uncertain outcome and increased probability of prolonged functional impairment or high probability of severe prolonged functional impairment associated with some of these differential diagnosis.  Medical Data Reviewed:  1.Data reviewed include clinical labs, radiology,  Medical Tests;   2.Tests results discussed w/performing or interpreting physician;   3.Obtaining/reviewing old medical records;  4.Obtaining case history from another source;  5.Independent review of image, tracing or specimen. .    Patient was informed the Neurology Consult would happen via TeleHealth consult by way of interactive audio and video telecommunications and consented to receiving care in this manner.

## 2018-04-27 NOTE — Progress Notes (Signed)
   04/27/18 2320  Clinical Encounter Type  Visited With Family (boyfriend of patient)  Visit Type Initial   Chaplain responded to Code Stroke and checked in with unit staff.  Patient was in CT but Chaplain spoke with her boyfriend.  Chaplain asked if any other family members were present and boyfriend said there were 2 others but they had already gone home.  Patient's boyfriend said he didn't think a chaplain was needed. Chaplain encouraged patient's boyfriend to ask the nurse to page the on-call Chaplain if needed.

## 2018-04-27 NOTE — ED Triage Notes (Signed)
Per ems family arrived at patient's house and patient was unable to speak. Pt is alert and oriented x4, but is nonverbal at this time. C/o diarrhea for a few days with intermittent abd pain

## 2018-04-27 NOTE — ED Provider Notes (Signed)
[facility] Emergency Department Provider Note   ____________________________________________   First MD Initiated Contact with Patient 04/27/18 2305     (approximate)  I have reviewed the triage vital signs and the nursing notes.   HISTORY  Chief Complaint Weakness, aphasia   HPI Barbara Petersen is a 47 y.o. female brought to the ED from home via EMS with a chief complaint of aphasia and weakness.  Fianc states approximately 9 PM, they were sitting on the porch when patient complained of left-sided weakness and she was unable to speak.  She was nonverbal for the duration of the transport to the hospital and is now starting to speak but states it is difficult for her to form her words.  Complains of diarrhea over the weekend with intermittent abdominal cramping.  Symptoms seem to improve today.  Denies associated fever, chills, headache, vision changes, neck pain, chest pain, shortness of breath, nausea, vomiting, dysuria.  Denies use of anticoagulants.  Denies recent travel or trauma.  States she has not taking her seizure medicine in quite some time.  Neysa Bonito denies seizure-like activity.  Patient did not bite her tongue or suffer urinary incontinence.   Past Medical History:  Diagnosis Date  . Hypertension   . Seizures [facility])     Patient Active Problem List   Diagnosis Date Noted  . Aphasia 04/28/2018    Past Surgical History:  Procedure Laterality Date  . APPENDECTOMY    . CHOLECYSTECTOMY    . TUBAL LIGATION      Prior to Admission medications   Medication Sig Start Date End Date Taking? Authorizing Provider  acetaminophen (TYLENOL) 325 MG tablet Take 2 tablets (650 mg total) by mouth every 6 (six) hours as needed. Patient not taking: Reported on 04/28/2018 04/03/18   Sharman Cheek, MD  albuterol (PROVENTIL HFA;VENTOLIN HFA) 108 [phone] Base) MCG/ACT inhaler Inhale 1-2 puffs into the lungs every 4 (four) hours as needed for wheezing or shortness of  breath. Patient not taking: Reported on 04/28/2018 01/20/17   Rockne Menghini, MD  benzonatate (TESSALON PERLES) 100 MG capsule Take 1 capsule (100 mg total) by mouth every 6 (six) hours as needed for cough. Patient not taking: Reported on 04/28/2018 01/20/17   Rockne Menghini, MD  cyclobenzaprine (FLEXERIL) 10 MG tablet Take 1 tablet (10 mg total) by mouth 3 (three) times daily as needed. Patient not taking: Reported on 04/28/2018 01/02/17   Joni Reining, PA-C  ibuprofen (ADVIL,MOTRIN) 600 MG tablet Take 1 tablet (600 mg total) by mouth every 6 (six) hours as needed. Patient not taking: Reported on 04/28/2018 07/08/17   Enid Derry, PA-C  naproxen (NAPROSYN) 500 MG tablet Take 1 tablet (500 mg total) by mouth 2 (two) times daily with a meal. Patient not taking: Reported on 04/28/2018 04/03/18   Sharman Cheek, MD  ondansetron (ZOFRAN) 4 MG tablet Take 1 tablet (4 mg total) by mouth every 8 (eight) hours as needed for nausea or vomiting. Patient not taking: Reported on 04/28/2018 01/14/18   Phineas Semen, MD  traMADol (ULTRAM) 50 MG tablet Take 1 tablet (50 mg total) by mouth every 6 (six) hours as needed for moderate pain. Patient not taking: Reported on 04/28/2018 01/02/17   Joni Reining, PA-C    Allergies Patient has no known allergies.  History reviewed. No pertinent family history.  Social History Social History   Tobacco Use  . Smoking status: Current Every Day Smoker    Types: Cigarettes  . Smokeless tobacco: Never  Used  Substance Use Topics  . Alcohol use: Yes    Comment: monthly  . Drug use: No    Review of Systems  Constitutional: No fever/chills Eyes: No visual changes. ENT: No sore throat. Cardiovascular: Denies chest pain. Respiratory: Denies shortness of breath. Gastrointestinal: Positive for abdominal pain.  No nausea, no vomiting.  Positive for diarrhea.  No constipation. Genitourinary: Negative for dysuria. Musculoskeletal: Negative for back  pain. Skin: Negative for rash. Neurological: Positive for weakness and speech difficulty.  Negative for headaches or numbness.   ____________________________________________   PHYSICAL EXAM:  VITAL SIGNS: ED Triage Vitals  Enc Vitals Group     BP 04/27/18 2206 (!) 144/92     Pulse Rate 04/27/18 2206 86     Resp 04/27/18 2206 16     Temp 04/27/18 2206 97.7 F (36.5 C)     Temp src --      SpO2 04/27/18 2206 98 %     Weight 04/27/18 2202 240 lb (108.9 kg)     Height --      Head Circumference --      Peak Flow --      Pain Score 04/27/18 2202 0     Pain Loc --      Pain Edu? --      Excl. in GC? --     Constitutional: Alert and oriented. Well appearing and in mild acute distress. Eyes: Conjunctivae are normal. PERRL. EOMI. Head: Atraumatic. Nose: No congestion/rhinnorhea. Mouth/Throat: Mucous membranes are moist.  Oropharynx non-erythematous. Neck: No stridor.  No cervical spine tenderness to palpation.  Supple neck without meningismus.  No carotid bruits. Cardiovascular: Normal rate, regular rhythm. Grossly normal heart sounds.  Good peripheral circulation. Respiratory: Normal respiratory effort.  No retractions. Lungs CTAB. Gastrointestinal: Soft and nontender to light or deep palpation. No distention. No abdominal bruits. No CVA tenderness. Musculoskeletal: No lower extremity tenderness nor edema.  No joint effusions. Neurologic: Alert and oriented x3.  Moderate aphasia noted.  CN II-XII grossly intact although it is very difficult for patient to stick her tongue out and to shrug her shoulders.  Aphasic speech and language. LUE and LLE 4/5 weakness.  Mild left pronator drift. Skin:  Skin is warm, dry and intact. No rash noted. Psychiatric: Mood and affect are normal. Speech and behavior are normal.  ____________________________________________   LABS (all labs ordered are listed, but only abnormal results are displayed)  Labs Reviewed  CBC WITH DIFFERENTIAL/PLATELET  - Abnormal; Notable for the following components:      Result Value   RDW 15.2 (*)    All other components within normal limits  COMPREHENSIVE METABOLIC PANEL - Abnormal; Notable for the following components:   Calcium 8.7 (*)    All other components within normal limits  URINALYSIS, COMPLETE (UACMP) WITH MICROSCOPIC - Abnormal; Notable for the following components:   Color, Urine YELLOW (*)    APPearance CLEAR (*)    All other components within normal limits  LIPID PANEL - Abnormal; Notable for the following components:   Triglycerides 238 (*)    HDL 30 (*)    VLDL 48 (*)    All other components within normal limits  MRSA PCR SCREENING  ETHANOL  URINE DRUG SCREEN, QUALITATIVE ([facility] ONLY)  PROTIME-INR  TROPONIN I  HIV ANTIBODY (ROUTINE TESTING)  HEMOGLOBIN A1C  POC URINE PREG, ED   ____________________________________________  EKG  ED ECG REPORT I, SUNG,JADE J, the attending physician, personally viewed and interpreted this ECG.  Date: 04/27/2018  EKG Time: 2206  Rate: 87  Rhythm: normal EKG, normal sinus rhythm  Axis: Normal  Intervals:none  ST&T Change: Nonspecific  ____________________________________________  RADIOLOGY  ED MD interpretation: No ICH; CTA head and neck unremarkable; chest x-ray demonstrates no acute cardiopulmonary process  Official radiology report(s): Ct Angio Head W/cm &/or Wo Cm  Result Date: 04/28/2018 CLINICAL DATA:  Initial evaluation for acute aphasia, inability to speak. EXAM: CT ANGIOGRAPHY HEAD AND NECK TECHNIQUE: Multidetector CT imaging of the head and neck was performed using the standard protocol during bolus administration of intravenous contrast. Multiplanar CT image reconstructions and MIPs were obtained to evaluate the vascular anatomy. Carotid stenosis measurements (when applicable) are obtained utilizing NASCET criteria, using the distal internal carotid diameter as the denominator. CONTRAST:  75mL ISOVUE-370 IOPAMIDOL  (ISOVUE-370) INJECTION 76% COMPARISON:  Prior noncontrast head CT from earlier the same day. FINDINGS: CTA NECK FINDINGS Aortic arch: Visualized aortic arch of normal caliber with normal branch pattern. No flow-limiting stenosis about the origin of the great vessels. Visualized subclavian arteries widely patent. Right carotid system: Right common and internal carotid arteries widely patent without stenosis, dissection, or occlusion. No significant atheromatous disease about the right carotid bifurcation. Left carotid system: Left common and internal carotid arteries widely patent without stenosis, dissection, or occlusion. No significant atheromatous disease about the left carotid bifurcation. Vertebral arteries: Both of the vertebral arteries arise from the subclavian arteries. Left vertebral artery slightly dominant. Vertebral arteries widely patent within the neck without stenosis, dissection or occlusion. Skeleton: No acute osseous abnormality. No discrete lytic or blastic osseous lesions. Mild cervical spondylolysis at C4-5 through C6-7. Other neck: No acute soft tissue abnormality within the neck. Salivary glands within normal limits. No adenopathy. Thyroid normal. Upper chest: Visualized upper chest demonstrates no acute finding. Visualized lungs are clear. Review of the MIP images confirms the above findings CTA HEAD FINDINGS Anterior circulation: Internal carotid arteries widely patent to the termini without stenosis or significant atheromatous irregularity. A1 segments, anterior communicating artery, and anterior cerebral arteries widely patent to their distal aspects without stenosis. M1 segments widely patent without stenosis. Normal MCA bifurcations. No proximal M2 occlusion. Distal MCA branches well perfused and symmetric. Posterior circulation: Vertebral arteries widely patent to the vertebrobasilar junction without stenosis. Left vertebral artery slightly dominant. Right PICA patent. Left PICA not  visualized. Basilar widely patent to its distal aspect. Superior cerebral arteries patent bilaterally. Both of the posterior cerebral arteries primarily supplied via the basilar and are widely patent to their distal aspects without stenosis. Venous sinuses: Patent. Anatomic variants: None significant. Ill-defined contrast opacification adjacent to the cavernous left ICA favored to reflect asymmetric filling of the left cavernous sinus. No discrete aneurysm. Delayed phase: No abnormal enhancement. Review of the MIP images confirms the above findings IMPRESSION: Negative CTA of the head and neck. No large vessel occlusion. No hemodynamically significant or correctable stenosis. Electronically Signed   By: Rise Mu M.D.   On: 04/28/2018 01:23   Ct Angio Neck W And/or Wo Contrast  Result Date: 04/28/2018 CLINICAL DATA:  Initial evaluation for acute aphasia, inability to speak. EXAM: CT ANGIOGRAPHY HEAD AND NECK TECHNIQUE: Multidetector CT imaging of the head and neck was performed using the standard protocol during bolus administration of intravenous contrast. Multiplanar CT image reconstructions and MIPs were obtained to evaluate the vascular anatomy. Carotid stenosis measurements (when applicable) are obtained utilizing NASCET criteria, using the distal internal carotid diameter as the denominator. CONTRAST:  75mL ISOVUE-370 IOPAMIDOL (ISOVUE-370)  INJECTION 76% COMPARISON:  Prior noncontrast head CT from earlier the same day. FINDINGS: CTA NECK FINDINGS Aortic arch: Visualized aortic arch of normal caliber with normal branch pattern. No flow-limiting stenosis about the origin of the great vessels. Visualized subclavian arteries widely patent. Right carotid system: Right common and internal carotid arteries widely patent without stenosis, dissection, or occlusion. No significant atheromatous disease about the right carotid bifurcation. Left carotid system: Left common and internal carotid arteries  widely patent without stenosis, dissection, or occlusion. No significant atheromatous disease about the left carotid bifurcation. Vertebral arteries: Both of the vertebral arteries arise from the subclavian arteries. Left vertebral artery slightly dominant. Vertebral arteries widely patent within the neck without stenosis, dissection or occlusion. Skeleton: No acute osseous abnormality. No discrete lytic or blastic osseous lesions. Mild cervical spondylolysis at C4-5 through C6-7. Other neck: No acute soft tissue abnormality within the neck. Salivary glands within normal limits. No adenopathy. Thyroid normal. Upper chest: Visualized upper chest demonstrates no acute finding. Visualized lungs are clear. Review of the MIP images confirms the above findings CTA HEAD FINDINGS Anterior circulation: Internal carotid arteries widely patent to the termini without stenosis or significant atheromatous irregularity. A1 segments, anterior communicating artery, and anterior cerebral arteries widely patent to their distal aspects without stenosis. M1 segments widely patent without stenosis. Normal MCA bifurcations. No proximal M2 occlusion. Distal MCA branches well perfused and symmetric. Posterior circulation: Vertebral arteries widely patent to the vertebrobasilar junction without stenosis. Left vertebral artery slightly dominant. Right PICA patent. Left PICA not visualized. Basilar widely patent to its distal aspect. Superior cerebral arteries patent bilaterally. Both of the posterior cerebral arteries primarily supplied via the basilar and are widely patent to their distal aspects without stenosis. Venous sinuses: Patent. Anatomic variants: None significant. Ill-defined contrast opacification adjacent to the cavernous left ICA favored to reflect asymmetric filling of the left cavernous sinus. No discrete aneurysm. Delayed phase: No abnormal enhancement. Review of the MIP images confirms the above findings IMPRESSION: Negative  CTA of the head and neck. No large vessel occlusion. No hemodynamically significant or correctable stenosis. Electronically Signed   By: Rise Mu M.D.   On: 04/28/2018 01:23   Dg Chest Port 1 View  Result Date: 04/28/2018 CLINICAL DATA:  Weakness and inability to speak. EXAM: PORTABLE CHEST 1 VIEW COMPARISON:  04/03/2018 FINDINGS: The heart size and mediastinal contours are within normal limits. Both lungs are clear. The visualized skeletal structures are unremarkable. IMPRESSION: No active disease.  Stable chest. Electronically Signed   By: Tollie Eth M.D.   On: 04/28/2018 00:59   Ct Head Code Stroke Wo Contrast  Result Date: 04/27/2018 CLINICAL DATA:  Code stroke.  Initial evaluation for acute aphasia. EXAM: CT HEAD WITHOUT CONTRAST TECHNIQUE: Contiguous axial images were obtained from the base of the skull through the vertex without intravenous contrast. COMPARISON:  Prior head CT from 09/10/2014. FINDINGS: Brain: Cerebral volume within normal limits. No acute intracranial hemorrhage. No acute large vessel territory infarct. No mass lesion, midline shift or mass effect. No hydrocephalus. No extra-axial fluid collection. Vascular: No hyperdense vessel. Skull: Scalp soft tissues within normal limits.  Calvarium intact. Sinuses/Orbits: Globes orbital soft tissues within normal limits. Paranasal sinuses are clear. No mastoid effusion. Other: None. ASPECTS [facility] Stroke Program Early CT Score) - Ganglionic level infarction (caudate, lentiform nuclei, internal capsule, insula, M1-M3 cortex): 7 - Supraganglionic infarction (M4-M6 cortex): 3 Total score (0-10 with 10 being normal): 10 IMPRESSION: 1. Negative head CT.  No acute intracranial  abnormality identified. 2. ASPECTS is 10. Critical Value/emergent results were called by telephone at the time of interpretation on 04/27/2018 at 11:47 pm to Dr. Lesly Rubenstein SUNG , who verbally acknowledged these results. Electronically Signed   By: Rise Mu  M.D.   On: 04/27/2018 23:47    ____________________________________________   PROCEDURES  Procedure(s) performed:   NIH Stroke Scale  Interval: Baseline Time: 6:25 AM Person Administering Scale: SUNG,JADE J  Administer stroke scale items in the order listed. Record performance in each category after each subscale exam. Do not go back and change scores. Follow directions provided for each exam technique. Scores should reflect what the patient does, not what the clinician thinks the patient can do. The clinician should record answers while administering the exam and work quickly. Except where indicated, the patient should not be coached (i.e., repeated requests to patient to make a special effort).   1a  Level of consciousness: 0=alert; keenly responsive  1b. LOC questions:  0=Performs both tasks correctly  1c. LOC commands: 0=Performs both tasks correctly  2.  Best Gaze: 0=normal  3.  Visual: 0=No visual loss  4. Facial Palsy: 0=Normal symmetric movement  5a.  Motor left arm: 1=Drift, limb holds 90 (or 45) degrees but drifts down before full 10 seconds: does not hit bed  5b.  Motor right arm: 0=No drift, limb holds 90 (or 45) degrees for full 10 seconds  6a. motor left leg: 1=Drift, limb holds 90 (or 45) degrees but drifts down before full 10 seconds: does not hit bed  6b  Motor right leg:  0=No drift, limb holds 90 (or 45) degrees for full 10 seconds  7. Limb Ataxia: 0=Absent  8.  Sensory: 0=Normal; no sensory loss  9. Best Language:  1=Mild to moderate aphasia; some obvious loss of fluency or facility of comprehension without significant limitation on ideas expressed or form of expression.  10. Dysarthria: 1=Mild to moderate, patient slurs at least some words and at worst, can be understood with some difficulty  11. Extinction and Inattention: 0=No abnormality  12. Distal motor function: 0=Normal   Total:   4   Procedures  Critical Care performed: Yes, see critical care  note(s)   CRITICAL CARE Performed by: Irean Hong   Total critical care time: 45 minutes  Critical care time was exclusive of separately billable procedures and treating other patients.  Critical care was necessary to treat or prevent imminent or life-threatening deterioration.  Critical care was time spent personally by me on the following activities: development of treatment plan with patient and/or surrogate as well as nursing, discussions with consultants, evaluation of patient's response to treatment, examination of patient, obtaining history from patient or surrogate, ordering and performing treatments and interventions, ordering and review of laboratory studies, ordering and review of radiographic studies, pulse oximetry and re-evaluation of patient's condition.  ____________________________________________   INITIAL IMPRESSION / ASSESSMENT AND PLAN / ED COURSE  As part of my medical decision making, I reviewed the following data within the electronic MEDICAL RECORD NUMBER History obtained from family, Nursing notes reviewed and incorporated, Labs reviewed, EKG interpreted, Old chart reviewed, Radiograph reviewed, Discussed with admitting physician, A consult was requested and obtained from this/these consultant(s) Neurology and Notes from prior ED visits   47 year old female who presents with aphasia and left-sided weakness since  9 PM.  Differential diagnosis includes but is not limited to ICH, CVA, TIA, seizure, migraine disorder, etc.  ED code stroke initiated.  Patient will be sent  for stat head CT and [facility] neurology consultation.  Scores a 4 on the NIH stroke scale.   Clinical Course as of Apr 28 624  Tue Apr 28, 2018  0002 Spoke with Dr. Avel Sensor from [facility] neurology.  He did recommend TPA to the patient who has declined it.  Recommends aspirin only at this time.  Also recommend CTA head and neck.  Will admit to hospitalist services.   [JS]  0136 Updated patient and fianc of  CTA results which are unremarkable.   [JS]    Clinical Course User Index [JS] Irean Hong, MD     ____________________________________________   FINAL CLINICAL IMPRESSION(S) / ED DIAGNOSES  Final diagnoses:  Aphasia  Cerebrovascular accident (CVA), unspecified mechanism (HCC)  Weakness     ED Discharge Orders    None       Note:  This document was prepared using Dragon voice recognition software and may include unintentional dictation errors.    Irean Hong, MD 04/28/18 [phone]

## 2018-04-28 ENCOUNTER — Other Ambulatory Visit: Payer: Self-pay

## 2018-04-28 ENCOUNTER — Inpatient Hospital Stay
Admit: 2018-04-28 | Discharge: 2018-04-28 | Disposition: A | Discharge status: Home / Self Care | Payer: Self-pay | Attending: Internal Medicine | Admitting: Internal Medicine

## 2018-04-28 ENCOUNTER — Encounter: Payer: Self-pay | Admitting: Radiology

## 2018-04-28 ENCOUNTER — Inpatient Hospital Stay: Payer: Self-pay

## 2018-04-28 ENCOUNTER — Emergency Department: Payer: Self-pay

## 2018-04-28 DIAGNOSIS — R531 Weakness: Principal | ICD-10-CM

## 2018-04-28 DIAGNOSIS — R4701 Aphasia: Secondary | ICD-10-CM | POA: Diagnosis present

## 2018-04-28 LAB — URINE DRUG SCREEN, QUALITATIVE (ARMC ONLY)
Amphetamines, Ur Screen: NOT DETECTED
BARBITURATES, UR SCREEN: NOT DETECTED
BENZODIAZEPINE, UR SCRN: NOT DETECTED
Cannabinoid 50 Ng, Ur ~~LOC~~: NOT DETECTED
Cocaine Metabolite,Ur ~~LOC~~: NOT DETECTED
MDMA (Ecstasy)Ur Screen: NOT DETECTED
METHADONE SCREEN, URINE: NOT DETECTED
OPIATE, UR SCREEN: NOT DETECTED
PHENCYCLIDINE (PCP) UR S: NOT DETECTED
Tricyclic, Ur Screen: NOT DETECTED

## 2018-04-28 LAB — LIPID PANEL
CHOL/HDL RATIO: 5.5 ratio
Cholesterol: 166 mg/dL (ref 0–200)
HDL: 30 mg/dL — AB (ref >40)
LDL CALC: 88 mg/dL (ref 0–99)
TRIGLYCERIDES: 238 mg/dL — AB (ref <150)
VLDL: 48 mg/dL — AB (ref 0–40)

## 2018-04-28 LAB — HEMOGLOBIN A1C
Hgb A1c MFr Bld: 5.9 % — ABNORMAL HIGH (ref 4.8–5.6)
Mean Plasma Glucose: 122.63 mg/dL

## 2018-04-28 LAB — ECHOCARDIOGRAM COMPLETE
Height: 64 in
WEIGHTICAEL: 3854.4 [oz_av]

## 2018-04-28 LAB — PROTIME-INR
INR: 0.95
Prothrombin Time: 12.6 seconds (ref 11.4–15.2)

## 2018-04-28 LAB — MRSA PCR SCREENING: MRSA BY PCR: NEGATIVE

## 2018-04-28 LAB — ETHANOL: Alcohol, Ethyl (B): <10 mg/dL (ref <10)

## 2018-04-28 MED ORDER — BISACODYL 5 MG PO TBEC: 5.0000 mg | DELAYED_RELEASE_TABLET | Freq: Every day | ORAL | Status: DC | PRN

## 2018-04-28 MED ORDER — SODIUM CHLORIDE 0.9 % IV SOLN
INTRAVENOUS | Status: DC
  Administered 2018-04-28: 03:00:00 via INTRAVENOUS

## 2018-04-28 MED ORDER — IOPAMIDOL (ISOVUE-370) INJECTION 76%
75.0000 mL | Freq: Once | INTRAVENOUS | Status: AC | PRN
  Administered 2018-04-28: 75 mL via INTRAVENOUS

## 2018-04-28 MED ORDER — SENNOSIDES-DOCUSATE SODIUM 8.6-50 MG PO TABS: 1.0000 | ORAL_TABLET | Freq: Every evening | ORAL | Status: DC | PRN

## 2018-04-28 MED ORDER — STROKE: EARLY STAGES OF RECOVERY BOOK
Freq: Once | Status: AC
  Administered 2018-04-28: 03:00:00

## 2018-04-28 MED ORDER — ENOXAPARIN SODIUM 40 MG/0.4ML ~~LOC~~ SOLN: 40.0000 mg | SUBCUTANEOUS | Status: DC

## 2018-04-28 MED ORDER — ACETAMINOPHEN 160 MG/5ML PO SOLN: 650.0000 mg | ORAL | Status: DC | PRN

## 2018-04-28 MED ORDER — ASPIRIN 81 MG PO TBEC: 81.0000 mg | DELAYED_RELEASE_TABLET | Freq: Every day | ORAL | 1 refills | Status: DC

## 2018-04-28 MED ORDER — ACETAMINOPHEN 650 MG RE SUPP
650.0000 mg | RECTAL | Status: DC | PRN
Start: 2018-04-28 — End: 2018-04-28

## 2018-04-28 MED ORDER — ONDANSETRON HCL 4 MG/2ML IJ SOLN: 4.0000 mg | Freq: Four times a day (QID) | INTRAMUSCULAR | Status: DC | PRN

## 2018-04-28 MED ORDER — ALBUTEROL SULFATE (2.5 MG/3ML) 0.083% IN NEBU: 2.5000 mg | INHALATION_SOLUTION | Freq: Four times a day (QID) | RESPIRATORY_TRACT | Status: DC | PRN

## 2018-04-28 MED ORDER — ACETAMINOPHEN 325 MG PO TABS: 650.0000 mg | ORAL_TABLET | ORAL | Status: DC | PRN

## 2018-04-28 MED ORDER — ASPIRIN EC 81 MG PO TBEC
81.0000 mg | DELAYED_RELEASE_TABLET | Freq: Every day | ORAL | Status: DC
  Administered 2018-04-28: 81 mg via ORAL
  Filled 2018-04-28: qty 1

## 2018-04-28 MED ORDER — ENOXAPARIN SODIUM 40 MG/0.4ML ~~LOC~~ SOLN
40.0000 mg | Freq: Two times a day (BID) | SUBCUTANEOUS | Status: DC
  Administered 2018-04-28: 40 mg via SUBCUTANEOUS
  Filled 2018-04-28: qty 0.4

## 2018-04-28 MED ORDER — ASPIRIN 81 MG PO CHEW
324.0000 mg | CHEWABLE_TABLET | Freq: Once | ORAL | Status: AC
  Administered 2018-04-28: 324 mg via ORAL
  Filled 2018-04-28: qty 4

## 2018-04-28 NOTE — Plan of Care (Signed)
Pt, admitted to r/o stroke - all tests were negative for stroke.  This morning pt still had some speech deficits and Lsided weakness and numbness.  NIHSS = 5.  Pt has improved throughout the day.  Pt's speaks with much more sureness and confidence.  Lipid panel was outside normal parameters so we called in a dietary consult to prevent any possible future event.  Pt was evaluated by Neurology.  They didn't think symptoms was caused by seizure either (pt has hx of seizure).  IV was removed by nurse tech.  D/c instructions reviewed and information provided on ways to prevent stroke.

## 2018-04-28 NOTE — ED Notes (Signed)
Pt continues to have periods of aphasia. No aphasia noted at this time. Equal grips and strengths bilaterally at this time.

## 2018-04-28 NOTE — Plan of Care (Signed)
Nutrition Education Note  RD consulted for nutrition education regarding hypertriglyceridemia.   Lipid Panel     Component Value Date/Time   CHOL 166 04/28/2018 0329   TRIG 238 (H) 04/28/2018 0329   HDL 30 (L) 04/28/2018 0329   CHOLHDL 5.5 04/28/2018 0329   VLDL 48 (H) 04/28/2018 0329   LDLCALC 88 04/28/2018 4980   47 year old female with PMHx of HTN, seizures who presented with weakness and aphasia of unclear etiology and symptoms have since improved. Stroke work-up was negative. Patient now with discharge order. Outpatient PT has been recommended.  Met with patient at bedside. She reports she had a decreased appetite for 3 days in setting of abdominal pain/cramping and diarrhea. She reports this has now improved and her appetite has improved today. She typically has a good appetite and intake and eats 3 meals per day. Weight is stable. Patient does not meet criteria for malnutrition.  RD provided "High Triglyceride Nutrition Therapy" handout from the Academy of [employer] Dietetics. Discussed what triglycerides are and how high levels of triglycerides can increase risk for heart disease. Reviewed patient's dietary recall. Provided examples on ways to decrease intake of foods high in added sugars and refined carbohydrates in diet. Encouraged fresh fruits and vegetables as well as whole grain sources of carbohydrates to maximize fiber intake. Encouraged intake of heart-healthy fats, especially omega-3 fatty acids from fish and seafood. Discussed that maintaining a healthy weight or losing weight if overweight can help reduce triglyceride levels. Encouraged participation in regular physical activity as approved by MD. Discussed that if patient drinks alcohol, limiting intake of alcohol can also help improve triglyceride levels. Teach back method used.  Expect good compliance.  Body mass index is 41.35 kg/m. Pt meets criteria for Obesity Class III (morbid obesity) based on current  BMI.  Current diet order is Heart Healthy. Labs and medications reviewed. No further nutrition interventions warranted at this time. RD contact information provided. If additional nutrition issues arise, please re-consult RD.  Willey Blade, MS, [place], LDN Office: [phone] Pager: [phone] After Hours/Weekend Pager: [phone]

## 2018-04-28 NOTE — Consult Note (Signed)
Referring Physician: Allena Katz    Chief Complaint: Difficulty with speech, left sided weakness.    HPI: Barbara Petersen is an 47 y.o. female who reports that on yesterday evening while at home had acute onset of difficulty with speech and left sided weakness.  Fiance called EMS and patient was brought in for evaluation.  She reports that symptoms improved overnight and is now near baseline with only some mild word finding difficulties and left sided weakness.   Initial NIHSS of 3.  Date last known well: Date: 04/27/2018 Time last known well: Time: 20:30 tPA Given: No: Patient refusal  Past Medical History:  Diagnosis Date  . Hypertension   . Seizures (HCC)     Past Surgical History:  Procedure Laterality Date  . APPENDECTOMY    . CHOLECYSTECTOMY    . TUBAL LIGATION      History reviewed. No pertinent family history.   Social History:  reports that she has been smoking cigarettes.  She has never used smokeless tobacco. She reports that she drinks alcohol. She reports that she does not use drugs.  Allergies: No Known Allergies  Medications:  I have reviewed the patient's current medications. Prior to Admission:  Medications Prior to Admission  Medication Sig Dispense Refill Last Dose  . acetaminophen (TYLENOL) 325 MG tablet Take 2 tablets (650 mg total) by mouth every 6 (six) hours as needed. (Patient not taking: Reported on 04/28/2018) 60 tablet 0 Not Taking at Unknown time  . albuterol (PROVENTIL HFA;VENTOLIN HFA) 108 (90 Base) MCG/ACT inhaler Inhale 1-2 puffs into the lungs every 4 (four) hours as needed for wheezing or shortness of breath. (Patient not taking: Reported on 04/28/2018) 1 Inhaler 0 Not Taking at Unknown time  . benzonatate (TESSALON PERLES) 100 MG capsule Take 1 capsule (100 mg total) by mouth every 6 (six) hours as needed for cough. (Patient not taking: Reported on 04/28/2018) 15 capsule 0 Not Taking at Unknown time  . cyclobenzaprine (FLEXERIL) 10 MG tablet Take 1 tablet  (10 mg total) by mouth 3 (three) times daily as needed. (Patient not taking: Reported on 04/28/2018) 15 tablet 0 Not Taking at Unknown time  . ibuprofen (ADVIL,MOTRIN) 600 MG tablet Take 1 tablet (600 mg total) by mouth every 6 (six) hours as needed. (Patient not taking: Reported on 04/28/2018) 30 tablet 0 Not Taking at Unknown time  . naproxen (NAPROSYN) 500 MG tablet Take 1 tablet (500 mg total) by mouth 2 (two) times daily with a meal. (Patient not taking: Reported on 04/28/2018) 20 tablet 0 Not Taking at Unknown time  . ondansetron (ZOFRAN) 4 MG tablet Take 1 tablet (4 mg total) by mouth every 8 (eight) hours as needed for nausea or vomiting. (Patient not taking: Reported on 04/28/2018) 20 tablet 0 Not Taking at Unknown time  . traMADol (ULTRAM) 50 MG tablet Take 1 tablet (50 mg total) by mouth every 6 (six) hours as needed for moderate pain. (Patient not taking: Reported on 04/28/2018) 12 tablet 0 Not Taking at Unknown time   Scheduled: . aspirin EC  81 mg Oral Daily  . enoxaparin (LOVENOX) injection  40 mg Subcutaneous BID    ROS: History obtained from the patient  General ROS: negative for - chills, fatigue, fever, night sweats, weight gain or weight loss Psychological ROS: negative for - behavioral disorder, hallucinations, memory difficulties, mood swings or suicidal ideation Ophthalmic ROS: negative for - blurry vision, double vision, eye pain or loss of vision ENT ROS: negative for - epistaxis, nasal  discharge, oral lesions, sore throat, tinnitus or vertigo Allergy and Immunology ROS: negative for - hives or itchy/watery eyes Hematological and Lymphatic ROS: negative for - bleeding problems, bruising or swollen lymph nodes Endocrine ROS: negative for - galactorrhea, hair pattern changes, polydipsia/polyuria or temperature intolerance Respiratory ROS: negative for - cough, hemoptysis, shortness of breath or wheezing Cardiovascular ROS: negative for - chest pain, dyspnea on exertion, edema  or irregular heartbeat Gastrointestinal ROS: negative for - abdominal pain, diarrhea, hematemesis, nausea/vomiting or stool incontinence Genito-Urinary ROS: negative for - dysuria, hematuria, incontinence or urinary frequency/urgency Musculoskeletal ROS: negative for - joint swelling or muscular weakness Neurological ROS: as noted in HPI Dermatological ROS: negative for rash and skin lesion changes  Physical Examination: Blood pressure 137/71, pulse 65, temperature 98.3 F (36.8 C), temperature source Oral, resp. rate 20, height 5\' 4"  (1.626 m), weight 109.3 kg (240 lb 14.4 oz), SpO2 98 %.  HEENT-  Normocephalic, no lesions, without obvious abnormality.  Normal external eye and conjunctiva.  Normal TM's bilaterally.  Normal auditory canals and external ears. Normal external nose, mucus membranes and septum.  Normal pharynx. Cardiovascular- S1, S2 normal, pulses palpable throughout   Lungs- chest clear, no wheezing, rales, normal symmetric air entry Abdomen- soft, non-tender; bowel sounds normal; no masses,  no organomegaly Extremities- no edema Lymph-no adenopathy palpable Musculoskeletal-no joint tenderness, deformity or swelling Skin-warm and dry, no hyperpigmentation, vitiligo, or suspicious lesions  Neurological Examination   Mental Status: Alert, oriented, thought content appropriate.  Speech fluent without evidence of aphasia.  Able to follow 3 step commands without difficulty. Cranial Nerves: II: Discs flat bilaterally; Visual fields grossly normal, pupils equal, round, reactive to light and accommodation III,IV, VI: ptosis not present, extra-ocular motions intact bilaterally V,VII: smile symmetric, facial light touch sensation normal bilaterally VIII: hearing normal bilaterally IX,X: gag reflex present XI: bilateral shoulder shrug XII: midline tongue extension Motor: Right : Upper extremity   5/5    Left:     Upper extremity   5/5  Lower extremity   5/5     Lower extremity    5/5 Patient gives inconsistent effort on the left but provides full strength when left and right side tested simultaneously. Sensory: Pinprick and light touch decreased in the LUE Deep Tendon Reflexes: 2+ and symmetric with absent AJ's bilaterally Plantars: Right: mute   Left: mute Cerebellar: Normal finger-to-nose and normal heel-to-shin testing bilaterally Gait: not tested due to safety concerns   Laboratory Studies:  Basic Metabolic Panel: Recent Labs  Lab 04/27/18 2225  NA 137  K 3.9  CL 104  CO2 25  GLUCOSE 88  BUN 13  CREATININE 0.86  CALCIUM 8.7*    Liver Function Tests: Recent Labs  Lab 04/27/18 2225  AST 24  ALT 18  ALKPHOS 56  BILITOT 0.4  PROT 8.0  ALBUMIN 4.1   No results for input(s): LIPASE, AMYLASE in the last 168 hours. No results for input(s): AMMONIA in the last 168 hours.  CBC: Recent Labs  Lab 04/27/18 2225  WBC 7.0  NEUTROABS 3.0  HGB 13.0  HCT 38.3  MCV 87.5  PLT 283    Cardiac Enzymes: Recent Labs  Lab 04/27/18 2225  TROPONINI <0.03    BNP: Invalid input(s): POCBNP  CBG: No results for input(s): GLUCAP in the last 168 hours.  Microbiology: Results for orders placed or performed during the hospital encounter of 04/27/18  MRSA PCR Screening     Status: None   Collection Time: 04/28/18  3:54  AM  Result Value Ref Range Status   MRSA by PCR NEGATIVE NEGATIVE Final    Comment:        The GeneXpert MRSA Assay (FDA approved for NASAL specimens only), is one component of a comprehensive MRSA colonization surveillance program. It is not intended to diagnose MRSA infection nor to guide or monitor treatment for MRSA infections. Performed at [facility], [address] [place], [place] [address]     Coagulation Studies: Recent Labs    04/28/18 0329  LABPROT 12.6  INR 0.95    Urinalysis:  Recent Labs  Lab 04/27/18 2238  COLORURINE YELLOW*  LABSPEC 1.016  PHURINE 7.0  GLUCOSEU NEGATIVE  HGBUR  NEGATIVE  BILIRUBINUR NEGATIVE  KETONESUR NEGATIVE  PROTEINUR NEGATIVE  NITRITE NEGATIVE  LEUKOCYTESUR NEGATIVE    Lipid Panel:    Component Value Date/Time   CHOL 166 04/28/2018 0329   TRIG 238 (H) 04/28/2018 0329   HDL 30 (L) 04/28/2018 0329   CHOLHDL 5.5 04/28/2018 0329   VLDL 48 (H) 04/28/2018 0329   LDLCALC 88 04/28/2018 0329    HgbA1C:  Lab Results  Component Value Date   HGBA1C 5.9 (H) 04/28/2018    Urine Drug Screen:      Component Value Date/Time   LABOPIA NONE DETECTED 04/27/2018 2238   COCAINSCRNUR NONE DETECTED 04/27/2018 2238   LABBENZ NONE DETECTED 04/27/2018 2238   AMPHETMU NONE DETECTED 04/27/2018 2238   THCU NONE DETECTED 04/27/2018 2238   LABBARB NONE DETECTED 04/27/2018 2238    Alcohol Level:  Recent Labs  Lab 04/28/18 0329  ETH <10    Other results: EKG: sinus rhythm at 87 bpm.  Imaging: Ct Angio Head W/cm &/or Wo Cm  Result Date: 04/28/2018 CLINICAL DATA:  Initial evaluation for acute aphasia, inability to speak. EXAM: CT ANGIOGRAPHY HEAD AND NECK TECHNIQUE: Multidetector CT imaging of the head and neck was performed using the standard protocol during bolus administration of intravenous contrast. Multiplanar CT image reconstructions and MIPs were obtained to evaluate the vascular anatomy. Carotid stenosis measurements (when applicable) are obtained utilizing NASCET criteria, using the distal internal carotid diameter as the denominator. CONTRAST:  75mL ISOVUE-370 IOPAMIDOL (ISOVUE-370) INJECTION 76% COMPARISON:  Prior noncontrast head CT from earlier the same day. FINDINGS: CTA NECK FINDINGS Aortic arch: Visualized aortic arch of normal caliber with normal branch pattern. No flow-limiting stenosis about the origin of the great vessels. Visualized subclavian arteries widely patent. Right carotid system: Right common and internal carotid arteries widely patent without stenosis, dissection, or occlusion. No significant atheromatous disease about the  right carotid bifurcation. Left carotid system: Left common and internal carotid arteries widely patent without stenosis, dissection, or occlusion. No significant atheromatous disease about the left carotid bifurcation. Vertebral arteries: Both of the vertebral arteries arise from the subclavian arteries. Left vertebral artery slightly dominant. Vertebral arteries widely patent within the neck without stenosis, dissection or occlusion. Skeleton: No acute osseous abnormality. No discrete lytic or blastic osseous lesions. Mild cervical spondylolysis at C4-5 through C6-7. Other neck: No acute soft tissue abnormality within the neck. Salivary glands within normal limits. No adenopathy. Thyroid normal. Upper chest: Visualized upper chest demonstrates no acute finding. Visualized lungs are clear. Review of the MIP images confirms the above findings CTA HEAD FINDINGS Anterior circulation: Internal carotid arteries widely patent to the termini without stenosis or significant atheromatous irregularity. A1 segments, anterior communicating artery, and anterior cerebral arteries widely patent to their distal aspects without stenosis. M1 segments widely patent without stenosis.  Normal MCA bifurcations. No proximal M2 occlusion. Distal MCA branches well perfused and symmetric. Posterior circulation: Vertebral arteries widely patent to the vertebrobasilar junction without stenosis. Left vertebral artery slightly dominant. Right PICA patent. Left PICA not visualized. Basilar widely patent to its distal aspect. Superior cerebral arteries patent bilaterally. Both of the posterior cerebral arteries primarily supplied via the basilar and are widely patent to their distal aspects without stenosis. Venous sinuses: Patent. Anatomic variants: None significant. Ill-defined contrast opacification adjacent to the cavernous left ICA favored to reflect asymmetric filling of the left cavernous sinus. No discrete aneurysm. Delayed phase: No  abnormal enhancement. Review of the MIP images confirms the above findings IMPRESSION: Negative CTA of the head and neck. No large vessel occlusion. No hemodynamically significant or correctable stenosis. Electronically Signed   By: Rise Mu M.D.   On: 04/28/2018 01:23   Ct Angio Neck W And/or Wo Contrast  Result Date: 04/28/2018 CLINICAL DATA:  Initial evaluation for acute aphasia, inability to speak. EXAM: CT ANGIOGRAPHY HEAD AND NECK TECHNIQUE: Multidetector CT imaging of the head and neck was performed using the standard protocol during bolus administration of intravenous contrast. Multiplanar CT image reconstructions and MIPs were obtained to evaluate the vascular anatomy. Carotid stenosis measurements (when applicable) are obtained utilizing NASCET criteria, using the distal internal carotid diameter as the denominator. CONTRAST:  75mL ISOVUE-370 IOPAMIDOL (ISOVUE-370) INJECTION 76% COMPARISON:  Prior noncontrast head CT from earlier the same day. FINDINGS: CTA NECK FINDINGS Aortic arch: Visualized aortic arch of normal caliber with normal branch pattern. No flow-limiting stenosis about the origin of the great vessels. Visualized subclavian arteries widely patent. Right carotid system: Right common and internal carotid arteries widely patent without stenosis, dissection, or occlusion. No significant atheromatous disease about the right carotid bifurcation. Left carotid system: Left common and internal carotid arteries widely patent without stenosis, dissection, or occlusion. No significant atheromatous disease about the left carotid bifurcation. Vertebral arteries: Both of the vertebral arteries arise from the subclavian arteries. Left vertebral artery slightly dominant. Vertebral arteries widely patent within the neck without stenosis, dissection or occlusion. Skeleton: No acute osseous abnormality. No discrete lytic or blastic osseous lesions. Mild cervical spondylolysis at C4-5 through C6-7.  Other neck: No acute soft tissue abnormality within the neck. Salivary glands within normal limits. No adenopathy. Thyroid normal. Upper chest: Visualized upper chest demonstrates no acute finding. Visualized lungs are clear. Review of the MIP images confirms the above findings CTA HEAD FINDINGS Anterior circulation: Internal carotid arteries widely patent to the termini without stenosis or significant atheromatous irregularity. A1 segments, anterior communicating artery, and anterior cerebral arteries widely patent to their distal aspects without stenosis. M1 segments widely patent without stenosis. Normal MCA bifurcations. No proximal M2 occlusion. Distal MCA branches well perfused and symmetric. Posterior circulation: Vertebral arteries widely patent to the vertebrobasilar junction without stenosis. Left vertebral artery slightly dominant. Right PICA patent. Left PICA not visualized. Basilar widely patent to its distal aspect. Superior cerebral arteries patent bilaterally. Both of the posterior cerebral arteries primarily supplied via the basilar and are widely patent to their distal aspects without stenosis. Venous sinuses: Patent. Anatomic variants: None significant. Ill-defined contrast opacification adjacent to the cavernous left ICA favored to reflect asymmetric filling of the left cavernous sinus. No discrete aneurysm. Delayed phase: No abnormal enhancement. Review of the MIP images confirms the above findings IMPRESSION: Negative CTA of the head and neck. No large vessel occlusion. No hemodynamically significant or correctable stenosis. Electronically Signed   By: Sharlet Salina  Phill Myron M.D.   On: 04/28/2018 01:23   Mr Brain Wo Contrast  Result Date: 04/28/2018 CLINICAL DATA:  Acute aphasia EXAM: MRI HEAD WITHOUT CONTRAST TECHNIQUE: Multiplanar, multiecho pulse sequences of the brain and surrounding structures were obtained without intravenous contrast. COMPARISON:  CT studies 04/27/2018 and today.  FINDINGS: Brain: Brain has normal appearance without evidence of malformation, atrophy, old or acute small or large vessel infarction, mass lesion, hemorrhage, hydrocephalus or extra-axial collection. Vascular: Major vessels at the base of the brain show flow. Venous sinuses appear patent. Skull and upper cervical spine: Normal. Sinuses/Orbits: Clear/normal. Other: None significant. IMPRESSION: Normal examination. No abnormality seen to explain the clinical presentation. Electronically Signed   By: Paulina Fusi M.D.   On: 04/28/2018 08:59   Dg Chest Port 1 View  Result Date: 04/28/2018 CLINICAL DATA:  Weakness and inability to speak. EXAM: PORTABLE CHEST 1 VIEW COMPARISON:  04/03/2018 FINDINGS: The heart size and mediastinal contours are within normal limits. Both lungs are clear. The visualized skeletal structures are unremarkable. IMPRESSION: No active disease.  Stable chest. Electronically Signed   By: Tollie Eth M.D.   On: 04/28/2018 00:59   Ct Head Code Stroke Wo Contrast  Result Date: 04/27/2018 CLINICAL DATA:  Code stroke.  Initial evaluation for acute aphasia. EXAM: CT HEAD WITHOUT CONTRAST TECHNIQUE: Contiguous axial images were obtained from the base of the skull through the vertex without intravenous contrast. COMPARISON:  Prior head CT from 09/10/2014. FINDINGS: Brain: Cerebral volume within normal limits. No acute intracranial hemorrhage. No acute large vessel territory infarct. No mass lesion, midline shift or mass effect. No hydrocephalus. No extra-axial fluid collection. Vascular: No hyperdense vessel. Skull: Scalp soft tissues within normal limits.  Calvarium intact. Sinuses/Orbits: Globes orbital soft tissues within normal limits. Paranasal sinuses are clear. No mastoid effusion. Other: None. ASPECTS [facility] Stroke Program Early CT Score) - Ganglionic level infarction (caudate, lentiform nuclei, internal capsule, insula, M1-M3 cortex): 7 - Supraganglionic infarction (M4-M6 cortex): 3  Total score (0-10 with 10 being normal): 10 IMPRESSION: 1. Negative head CT.  No acute intracranial abnormality identified. 2. ASPECTS is 10. Critical Value/emergent results were called by telephone at the time of interpretation on 04/27/2018 at 11:47 pm to Dr. Lesly Rubenstein SUNG , who verbally acknowledged these results. Electronically Signed   By: Rise Mu M.D.   On: 04/27/2018 23:47    Assessment: 47 y.o. female presenting with left sided weakness and aphasia.  Symptoms improved.  MRI of the brain reviewed and shows no acute changes.  CT angio unremarkable.  Doubt TIA.  Patient with history of frequent headaches.  This presentation may represent a migraine equivalent.    Stroke Risk Factors - hypertension and smoking  Plan: 1. ASA 81mg  daily 2. Follow up with neurology on an outpatient basis.    Case discussed with Dr. Allena Katz.    Thana Farr, MD Neurology [phone] 04/28/2018, 10:46 AM

## 2018-04-28 NOTE — Discharge Instructions (Signed)
Patient advised to get primary care in the area. °

## 2018-04-28 NOTE — Discharge Summary (Signed)
[facility] [place] [facility]   PATIENT NAME: Barbara Petersen    MR#:  [id number]  DATE OF BIRTH:  [birth year]  DATE OF ADMISSION:  04/27/2018 ADMITTING PHYSICIAN: [name], MD  DATE OF DISCHARGE: 04/28/2018  PRIMARY CARE PHYSICIAN: Patient, No Pcp Per    ADMISSION DIAGNOSIS:  Aphasia [R47.01] Weakness [R53.1] Cerebrovascular accident (CVA), unspecified mechanism (HCC) [I63.9]  DISCHARGE DIAGNOSIS:  generalized weakness and dysarthria resolved etiology unclear  SECONDARY DIAGNOSIS:   Past Medical History:  Diagnosis Date  . Hypertension   . Seizures [facility])     HOSPITAL COURSE:   Caroleen Stoermer is an 47 y.o. female who reports that on yesterday evening while at home had acute onset of difficulty with speech and left sided weakness  1. generalized weakness with expressive/dysarthria-- resolved. Etiology unclear -no signs symptoms of seizures -workup for stroke negative including MRI of the brain. -Patient is seen by Dr. Thad Ranger from neurology recommends baby aspirin 81 mg daily -patient feeling a whole lot better. -She is eager to go home and get some rest  Will discharge patient to home. Recommended get primary care physician established.  CONSULTS OBTAINED:  Treatment Team:  [name], MD Thana Farr, MD  DRUG ALLERGIES:  No Known Allergies  DISCHARGE MEDICATIONS:   Allergies as of 04/28/2018   No Known Allergies     Medication List    STOP taking these medications   acetaminophen 325 MG tablet Commonly known as:  TYLENOL   albuterol 108 (90 Base) MCG/ACT inhaler Commonly known as:  PROVENTIL HFA;VENTOLIN HFA   benzonatate 100 MG capsule Commonly known as:  TESSALON PERLES   cyclobenzaprine 10 MG tablet Commonly known as:  FLEXERIL   ibuprofen 600 MG tablet Commonly known as:  ADVIL,MOTRIN   naproxen 500 MG tablet Commonly known as:  NAPROSYN   ondansetron 4 MG tablet Commonly known as:   ZOFRAN   traMADol 50 MG tablet Commonly known as:  ULTRAM     TAKE these medications   aspirin 81 MG EC tablet Take 1 tablet (81 mg total) by mouth daily. Start taking on:  04/29/2018       If you experience worsening of your admission symptoms, develop shortness of breath, life threatening emergency, suicidal or homicidal thoughts you must seek medical attention immediately by calling 911 or calling your MD immediately  if symptoms less severe.  You Must read complete instructions/literature along with all the possible adverse reactions/side effects for all the Medicines you take and that have been prescribed to you. Take any new Medicines after you have completely understood and accept all the possible adverse reactions/side effects.   Please note  You were cared for by a hospitalist during your hospital stay. If you have any questions about your discharge medications or the care you received while you were in the hospital after you are discharged, you can call the unit and asked to speak with the hospitalist on call if the hospitalist that took care of you is not available. Once you are discharged, your primary care physician will handle any further medical issues. Please note that NO REFILLS for any discharge medications will be authorized once you are discharged, as it is imperative that you return to your primary care physician (or establish a relationship with a primary care physician if you do not have one) for your aftercare needs so that they can reassess your need for medications and monitor your lab values. Today   SUBJECTIVE  Doing well  VITAL SIGNS:  Blood pressure 123/73, pulse 64, temperature 98 F (36.7 C), temperature source Oral, resp. rate 20, height 5\' 4"  (1.626 m), weight 109.3 kg (240 lb 14.4 oz), SpO2 100 %.  I/O:    Intake/Output Summary (Last 24 hours) at 04/28/2018 1220 Last data filed at 04/28/2018 0406 Gross per 24 hour  Intake 9.33 ml  Output -  Net  9.33 ml    PHYSICAL EXAMINATION:  GENERAL:  47 y.o.-year-old patient lying in the bed with no acute distress.  EYES: Pupils equal, round, reactive to light and accommodation. No scleral icterus. Extraocular muscles intact.  HEENT: Head atraumatic, normocephalic. Oropharynx and nasopharynx clear.  NECK:  Supple, no jugular venous distention. No thyroid enlargement, no tenderness.  LUNGS: Normal breath sounds bilaterally, no wheezing, rales,rhonchi or crepitation. No use of accessory muscles of respiration.  CARDIOVASCULAR: S1, S2 normal. No murmurs, rubs, or gallops.  ABDOMEN: Soft, non-tender, non-distended. Bowel sounds present. No organomegaly or mass.  EXTREMITIES: No pedal edema, cyanosis, or clubbing.  NEUROLOGIC: Cranial nerves II through XII are intact. Muscle strength 5/5 in all extremities. Sensation intact. Gait not checked.  PSYCHIATRIC: The patient is alert and oriented x 3.  SKIN: No obvious rash, lesion, or ulcer.   DATA REVIEW:   CBC  Recent Labs  Lab 04/27/18 2225  WBC 7.0  HGB 13.0  HCT 38.3  PLT 283    Chemistries  Recent Labs  Lab 04/27/18 2225  NA 137  K 3.9  CL 104  CO2 25  GLUCOSE 88  BUN 13  CREATININE 0.86  CALCIUM 8.7*  AST 24  ALT 18  ALKPHOS 56  BILITOT 0.4    Microbiology Results   Recent Results (from the past 240 hour(s))  MRSA PCR Screening     Status: None   Collection Time: 04/28/18  3:54 AM  Result Value Ref Range Status   MRSA by PCR NEGATIVE NEGATIVE Final    Comment:        The GeneXpert MRSA Assay (FDA approved for NASAL specimens only), is one component of a comprehensive MRSA colonization surveillance program. It is not intended to diagnose MRSA infection nor to guide or monitor treatment for MRSA infections. Performed at [facility], [address] [place], [place] [address]     RADIOLOGY:  Ct Angio Head W/cm &/or Wo Cm  Result Date: 04/28/2018 CLINICAL DATA:  Initial evaluation for acute  aphasia, inability to speak. EXAM: CT ANGIOGRAPHY HEAD AND NECK TECHNIQUE: Multidetector CT imaging of the head and neck was performed using the standard protocol during bolus administration of intravenous contrast. Multiplanar CT image reconstructions and MIPs were obtained to evaluate the vascular anatomy. Carotid stenosis measurements (when applicable) are obtained utilizing NASCET criteria, using the distal internal carotid diameter as the denominator. CONTRAST:  75mL ISOVUE-370 IOPAMIDOL (ISOVUE-370) INJECTION 76% COMPARISON:  Prior noncontrast head CT from earlier the same day. FINDINGS: CTA NECK FINDINGS Aortic arch: Visualized aortic arch of normal caliber with normal branch pattern. No flow-limiting stenosis about the origin of the great vessels. Visualized subclavian arteries widely patent. Right carotid system: Right common and internal carotid arteries widely patent without stenosis, dissection, or occlusion. No significant atheromatous disease about the right carotid bifurcation. Left carotid system: Left common and internal carotid arteries widely patent without stenosis, dissection, or occlusion. No significant atheromatous disease about the left carotid bifurcation. Vertebral arteries: Both of the vertebral arteries arise from the subclavian arteries. Left vertebral artery slightly dominant. Vertebral  arteries widely patent within the neck without stenosis, dissection or occlusion. Skeleton: No acute osseous abnormality. No discrete lytic or blastic osseous lesions. Mild cervical spondylolysis at C4-5 through C6-7. Other neck: No acute soft tissue abnormality within the neck. Salivary glands within normal limits. No adenopathy. Thyroid normal. Upper chest: Visualized upper chest demonstrates no acute finding. Visualized lungs are clear. Review of the MIP images confirms the above findings CTA HEAD FINDINGS Anterior circulation: Internal carotid arteries widely patent to the termini without stenosis  or significant atheromatous irregularity. A1 segments, anterior communicating artery, and anterior cerebral arteries widely patent to their distal aspects without stenosis. M1 segments widely patent without stenosis. Normal MCA bifurcations. No proximal M2 occlusion. Distal MCA branches well perfused and symmetric. Posterior circulation: Vertebral arteries widely patent to the vertebrobasilar junction without stenosis. Left vertebral artery slightly dominant. Right PICA patent. Left PICA not visualized. Basilar widely patent to its distal aspect. Superior cerebral arteries patent bilaterally. Both of the posterior cerebral arteries primarily supplied via the basilar and are widely patent to their distal aspects without stenosis. Venous sinuses: Patent. Anatomic variants: None significant. Ill-defined contrast opacification adjacent to the cavernous left ICA favored to reflect asymmetric filling of the left cavernous sinus. No discrete aneurysm. Delayed phase: No abnormal enhancement. Review of the MIP images confirms the above findings IMPRESSION: Negative CTA of the head and neck. No large vessel occlusion. No hemodynamically significant or correctable stenosis. Electronically Signed   By: Rise MuBenjamin  McClintock M.D.   On: 04/28/2018 01:23   Ct Angio Neck W And/or Wo Contrast  Result Date: 04/28/2018 CLINICAL DATA:  Initial evaluation for acute aphasia, inability to speak. EXAM: CT ANGIOGRAPHY HEAD AND NECK TECHNIQUE: Multidetector CT imaging of the head and neck was performed using the standard protocol during bolus administration of intravenous contrast. Multiplanar CT image reconstructions and MIPs were obtained to evaluate the vascular anatomy. Carotid stenosis measurements (when applicable) are obtained utilizing NASCET criteria, using the distal internal carotid diameter as the denominator. CONTRAST:  75mL ISOVUE-370 IOPAMIDOL (ISOVUE-370) INJECTION 76% COMPARISON:  Prior noncontrast head CT from earlier  the same day. FINDINGS: CTA NECK FINDINGS Aortic arch: Visualized aortic arch of normal caliber with normal branch pattern. No flow-limiting stenosis about the origin of the great vessels. Visualized subclavian arteries widely patent. Right carotid system: Right common and internal carotid arteries widely patent without stenosis, dissection, or occlusion. No significant atheromatous disease about the right carotid bifurcation. Left carotid system: Left common and internal carotid arteries widely patent without stenosis, dissection, or occlusion. No significant atheromatous disease about the left carotid bifurcation. Vertebral arteries: Both of the vertebral arteries arise from the subclavian arteries. Left vertebral artery slightly dominant. Vertebral arteries widely patent within the neck without stenosis, dissection or occlusion. Skeleton: No acute osseous abnormality. No discrete lytic or blastic osseous lesions. Mild cervical spondylolysis at C4-5 through C6-7. Other neck: No acute soft tissue abnormality within the neck. Salivary glands within normal limits. No adenopathy. Thyroid normal. Upper chest: Visualized upper chest demonstrates no acute finding. Visualized lungs are clear. Review of the MIP images confirms the above findings CTA HEAD FINDINGS Anterior circulation: Internal carotid arteries widely patent to the termini without stenosis or significant atheromatous irregularity. A1 segments, anterior communicating artery, and anterior cerebral arteries widely patent to their distal aspects without stenosis. M1 segments widely patent without stenosis. Normal MCA bifurcations. No proximal M2 occlusion. Distal MCA branches well perfused and symmetric. Posterior circulation: Vertebral arteries widely patent to the vertebrobasilar junction without  stenosis. Left vertebral artery slightly dominant. Right PICA patent. Left PICA not visualized. Basilar widely patent to its distal aspect. Superior cerebral  arteries patent bilaterally. Both of the posterior cerebral arteries primarily supplied via the basilar and are widely patent to their distal aspects without stenosis. Venous sinuses: Patent. Anatomic variants: None significant. Ill-defined contrast opacification adjacent to the cavernous left ICA favored to reflect asymmetric filling of the left cavernous sinus. No discrete aneurysm. Delayed phase: No abnormal enhancement. Review of the MIP images confirms the above findings IMPRESSION: Negative CTA of the head and neck. No large vessel occlusion. No hemodynamically significant or correctable stenosis. Electronically Signed   By: Rise Mu M.D.   On: 04/28/2018 01:23   Mr Brain Wo Contrast  Result Date: 04/28/2018 CLINICAL DATA:  Acute aphasia EXAM: MRI HEAD WITHOUT CONTRAST TECHNIQUE: Multiplanar, multiecho pulse sequences of the brain and surrounding structures were obtained without intravenous contrast. COMPARISON:  CT studies 04/27/2018 and today. FINDINGS: Brain: Brain has normal appearance without evidence of malformation, atrophy, old or acute small or large vessel infarction, mass lesion, hemorrhage, hydrocephalus or extra-axial collection. Vascular: Major vessels at the base of the brain show flow. Venous sinuses appear patent. Skull and upper cervical spine: Normal. Sinuses/Orbits: Clear/normal. Other: None significant. IMPRESSION: Normal examination. No abnormality seen to explain the clinical presentation. Electronically Signed   By: Paulina Fusi M.D.   On: 04/28/2018 08:59   Dg Chest Port 1 View  Result Date: 04/28/2018 CLINICAL DATA:  Weakness and inability to speak. EXAM: PORTABLE CHEST 1 VIEW COMPARISON:  04/03/2018 FINDINGS: The heart size and mediastinal contours are within normal limits. Both lungs are clear. The visualized skeletal structures are unremarkable. IMPRESSION: No active disease.  Stable chest. Electronically Signed   By: Tollie Eth M.D.   On: 04/28/2018 00:59    Ct Head Code Stroke Wo Contrast  Result Date: 04/27/2018 CLINICAL DATA:  Code stroke.  Initial evaluation for acute aphasia. EXAM: CT HEAD WITHOUT CONTRAST TECHNIQUE: Contiguous axial images were obtained from the base of the skull through the vertex without intravenous contrast. COMPARISON:  Prior head CT from 09/10/2014. FINDINGS: Brain: Cerebral volume within normal limits. No acute intracranial hemorrhage. No acute large vessel territory infarct. No mass lesion, midline shift or mass effect. No hydrocephalus. No extra-axial fluid collection. Vascular: No hyperdense vessel. Skull: Scalp soft tissues within normal limits.  Calvarium intact. Sinuses/Orbits: Globes orbital soft tissues within normal limits. Paranasal sinuses are clear. No mastoid effusion. Other: None. ASPECTS [facility] Stroke Program Early CT Score) - Ganglionic level infarction (caudate, lentiform nuclei, internal capsule, insula, M1-M3 cortex): 7 - Supraganglionic infarction (M4-M6 cortex): 3 Total score (0-10 with 10 being normal): 10 IMPRESSION: 1. Negative head CT.  No acute intracranial abnormality identified. 2. ASPECTS is 10. Critical Value/emergent results were called by telephone at the time of interpretation on 04/27/2018 at 11:47 pm to Dr. Lesly Rubenstein SUNG , who verbally acknowledged these results. Electronically Signed   By: Rise Mu M.D.   On: 04/27/2018 23:47     Management plans discussed with the patient, family and they are in agreement.  CODE STATUS:     Code Status Orders  (From admission, onward)        Start     Ordered   04/28/18 0242  Full code  Continuous     04/28/18 0241    Code Status History    This patient has a current code status but no historical code status.  TOTAL TIME TAKING CARE OF THIS PATIENT: *40* minutes.    Enedina Finner M.D on 04/28/2018 at 12:20 PM  Between 7am to 6pm - Pager - [phone] After 6pm go to [email or web address] - password [employer]  [facility] [place]  [facility]  Office  [phone]  CC: Primary care physician; Patient, No Pcp Per

## 2018-04-28 NOTE — Progress Notes (Signed)
*  PRELIMINARY RESULTS* Echocardiogram 2D Echocardiogram has been performed.  Joanette GulaJoan M Ivory Bail 04/28/2018, 10:47 AM

## 2018-04-28 NOTE — Evaluation (Signed)
Physical Therapy Evaluation Patient Details Name: Barbara Petersen MRN: [id number] DOB: 1971-01-12 Today's Date: 04/28/2018   History of Present Illness  47 y/o female here with aphasia, L sided weakness/coordination issues and though she is feeling much better is clearly not at baseline.   Clinical Impression  Pt initially states that she feels she is back to baseline, with minimal testing it becomes clear that this is not the case.  Pt with L sided weakness and coordination issues as well as some continued speech/word finding issues.  She was able to ambulate 250 ft w/o AD, negotiate steps and perform all mobility acts, but is not at her baseline with any of these and needed cuing and to be much more deliberate with most tasks.  Pt's son present and reports that they will be able to assist intermittently as needed (when fiance is at 1st shift job), pt would greatly benefit from outpt PT services to work towards more independent/baseline activity level.    Follow Up Recommendations Outpatient PT(May need [facility] 2/2 insurance situation)    Equipment Recommendations  None recommended by PT    Recommendations for Other Services       Precautions / Restrictions Precautions Precautions: None Restrictions Weight Bearing Restrictions: No      Mobility  Bed Mobility Overal bed mobility: Independent                Transfers Overall transfer level: Independent                  Ambulation/Gait Ambulation/Gait assistance: Modified independent (Device/Increase time) Ambulation Distance (Feet): 250 Feet Assistive device: None       General Gait Details: Pt with mild limp on L and needed to be more deliberate with managing L foot placement, etc.  She had good speed and no overt LOBs, but was not at her baseline.  Stairs Stairs: Yes Stairs assistance: Supervision Stair Management: One rail Right;Forwards;Step to pattern Number of Stairs: 6 General stair comments: First  attempt at step pt had buckling in L knee when stepping with R to first step.  Encouraged more purposeful engagement of L LE and use of UEs to insure safety.  Pt very slow, cautious and reliant on UEs descending steps.  Wheelchair Mobility    Modified Rankin (Stroke Patients Only)       Balance Overall balance assessment: Mild deficits observed, not formally tested(Pt safe, but not at baseline. )                                           Pertinent Vitals/Pain Pain Assessment: No/denies pain    Home Living Family/patient expects to be discharged to:: Private residence Living Arrangements: Spouse/significant other Available Help at Discharge: Family;Available PRN/intermittently   Home Access: Stairs to enter Entrance Stairs-Rails: Right;Left Entrance Stairs-Number of Steps: 5   Home Equipment: None      Prior Function Level of Independence: Independent         Comments: Pt was independent/active, able to do all she needed.  Supposed to start a job soon.     Hand Dominance        Extremity/Trunk Assessment   Upper Extremity Assessment Upper Extremity Assessment: Defer to OT evaluation(R WFL, L weaker (more so distally), decreased coordination)    Lower Extremity Assessment Lower Extremity Assessment: LLE deficits/detail LLE Deficits / Details: Pt with decreased quality  of motion with L LE testing, coordination with ankle pumps/heel to shin. Functional strength t/o but weaker than R and weakness more pronounced distally       Communication      Cognition Arousal/Alertness: Awake/alert Behavior During Therapy: WFL for tasks assessed/performed Overall Cognitive Status: Within Functional Limits for tasks assessed                                        General Comments      Exercises     Assessment/Plan    PT Assessment Patient needs continued PT services  PT Problem List Decreased strength;Decreased activity  tolerance;Decreased balance;Decreased mobility;Decreased coordination;Decreased safety awareness       PT Treatment Interventions Stair training;Gait training;Functional mobility training;Therapeutic activities;Therapeutic exercise;Balance training;Patient/family education;Neuromuscular re-education    PT Goals (Current goals can be found in the Care Plan section)  Acute Rehab PT Goals Patient Stated Goal: go home, start new job (next week?) PT Goal Formulation: With patient Time For Goal Achievement: 05/12/18 Potential to Achieve Goals: Good    Frequency 7X/week   Barriers to discharge        Co-evaluation               AM-PAC PT "6 Clicks" Daily Activity  Outcome Measure Difficulty turning over in bed (including adjusting bedclothes, sheets and blankets)?: None Difficulty moving from lying on back to sitting on the side of the bed? : None Difficulty sitting down on and standing up from a chair with arms (e.g., wheelchair, bedside commode, etc,.)?: None Help needed moving to and from a bed to chair (including a wheelchair)?: None Help needed walking in hospital room?: None Help needed climbing 3-5 steps with a railing? : A Little 6 Click Score: 23    End of Session Equipment Utilized During Treatment: Gait belt Activity Tolerance: Patient tolerated treatment well Patient left: in bed;with call bell/phone within reach Nurse Communication: Mobility status PT Visit Diagnosis: Muscle weakness (generalized) (M62.81);Other abnormalities of gait and mobility (R26.89);Hemiplegia and hemiparesis Hemiplegia - Right/Left: Left Hemiplegia - dominant/non-dominant: Non-dominant Hemiplegia - caused by: Unspecified    Time: 1104-1130 PT Time Calculation (min) (ACUTE ONLY): 26 min   Charges:   PT Evaluation $PT Eval Low Complexity: 1 Low     PT G Codes:        Malachi ProGalen R Adelaido Nicklaus, DPT 04/28/2018, 12:16 PM

## 2018-04-28 NOTE — Evaluation (Signed)
Speech Language Pathology Evaluation Patient Details Name: Barbara Petersen MRN: [id number] DOB: 05/29/1971 Today's Date: 04/28/2018 Time: [id number] SLP Time Calculation (min) (ACUTE ONLY): 51 min  Problem List:  Patient Active Problem List   Diagnosis Date Noted  . Aphasia 04/28/2018   Past Medical History:  Past Medical History:  Diagnosis Date  . Hypertension   . Seizures (HCC)    Past Surgical History:  Past Surgical History:  Procedure Laterality Date  . APPENDECTOMY    . CHOLECYSTECTOMY    . TUBAL LIGATION     HPI:  Barbara Petersen  is a 47 y.o. female with a known history of morbid obesity, tobacco use D/O, seizure D/O (not on medication) p/w aphasia, L-sided weakness. Pt was at home, lying down. States she felt hot, got up @~2100PM, went outside to the porch where here fiance was. She felt weak all over. She tried to sit down, and noticed she was weakest in her L arm, also slightly less weak in her L leg. She attempted to speak to her fiance, but was unable to get the words out. She states she knew what she wanted to say, but was unable to say it. Her fiance says she was mumbling. Her fiance asked if he should call EMS. She nodded her head in the affirmative and EMS was called. tPA declined by pt. She is able to speak to me in complete sentences with minimal word-finding difficulty, no slurred speech and no facial asymmetry at the time of my exam. She states she has greatly improved since that time. She does exhibit weakness in her LUE and LLE. CTA head/neck performed in ED was (-).    Assessment / Plan / Recommendation Clinical Impression  47 year old woman, with abrupt onset of unilateral weakness and aphasia, admitted 04/27/2018.  MRI 04/28/2018 was normal. Patient states most symptoms have resolved, but "sometimes I can't get the word out".  The patient is demonstrating moderate dysfluencies (initial sound prolongation and blockage).  She demonstrates expressive and receptive language  within normal limits (Western Aphasia Battery Bedside Aphasia Score= 100/100) and no dysarthria.  Patient was counseled to seek speech therapy after discharge if fluency remains problematic.    SLP Assessment       Follow Up Recommendations  Outpatient SLP(If symptoms do not resolve)    Frequency and Duration min 1 x/week         SLP Evaluation Cognition  Orientation Level: Oriented X4       Comprehension  Auditory Comprehension Overall Auditory Comprehension: Appears within functional limits for tasks assessed    Expression Expression Primary Mode of Expression: Verbal Verbal Expression Overall Verbal Expression: Appears within functional limits for tasks assessed   Oral / Motor  Motor Speech Overall Motor Speech: Impaired(Dysfluencies/Acquired stuttering) Respiration: Within functional limits Phonation: Normal Resonance: Within functional limits Articulation: Within functional limitis Intelligibility: Intelligible Motor Planning: Witnin functional limits   GO                   Dollene PrimroseSusan G Sukhmani Fetherolf, MS/CCC- SLP  Tedd SiasAbernathy, Susie 04/28/2018, 10:29 AM

## 2018-04-28 NOTE — Care Management Note (Signed)
Case Management Note  Patient Details  Name: Barbara MurdersValerie Zappone MRN: [id number] Date of Birth: 04/22/1971  Subjective/Objective:                  Admitted to [facility] with the diagnosis of aphasia. Lives with friend, Evaristo BuryJasper. Mother is Mickeal SkinnerShirley Jones [phone]). No primary care physician. No insurance. States she is suppose to start a new job this week[place]  States she has been to the [facility] 6 years ago.  Sister will transport  Action/Plan: Physical therapy evaluation completed. Recommending outpatient therapy. [facility] information given. [facility] application given. Will update Smiley HousemanLorrie Carter at [facility]   Expected Discharge Date:  04/28/18               Expected Discharge Plan:     In-House Referral:     Discharge planning Services     Post Acute Care Choice:    Choice offered to:     DME Arranged:    DME Agency:     HH Arranged:    HH Agency:     Status of Service:     If discussed at [employer] Meetings, dates discussed:    Additional Comments:  Gwenette GreetBrenda S Klint Lezcano, RN MSN CCM Care Management [phone] 04/28/2018, 1:31 PM

## 2018-04-28 NOTE — H&P (Addendum)
[facility] [place] [facility]   PATIENT NAME: Barbara Petersen    MR#:  [id number]  DATE OF BIRTH:  May 09, 1971  DATE OF ADMISSION:  04/27/2018  PRIMARY CARE PHYSICIAN: Patient, No Pcp Per   REQUESTING/REFERRING PHYSICIAN: Irean Hong, MD  CHIEF COMPLAINT:  No chief complaint on file.   HISTORY OF PRESENT ILLNESS:  Barbara Petersen  is a 47 y.o. female with a known history of morbid obesity, tobacco use D/O, seizure D/O (not on medication) p/w aphasia, L-sided weakness. Pt was at home, lying down. States she felt hot, got up @~2100PM, went outside to the porch where here fiance was. She felt weak all over. She tried to sit down, and noticed she was weakest in her L arm, also slightly less weak in her L leg. She attempted to speak to her fiance, but was unable to get the words out. She states she knew what she wanted to say, but was unable to say it. Her fiance says she was mumbling. Her fiance asked if he should call EMS. She nodded her head in the affirmative and EMS was called. tPA declined by pt. She is able to speak to me in complete sentences with minimal word-finding difficulty, no slurred speech and no facial asymmetry at the time of my exam. She states she has greatly improved since that time. She does exhibit weakness in her LUE and LLE. CTA head/neck performed in ED was (-). Tele-Neurology consulted, recommended ASA, DVT PPx (Lovenox), permissive HTN, PT/OT/SLP, swallow eval, inpt Neurology consult. Will admit for acute CVA evaluation.  Also notes some AP/diarrhea x3d, (-) N/V, (-) F/C. Abdomen (-) TTP, pt comfortable and not in distress at the time of my assessment. SIRS (-).  PAST MEDICAL HISTORY:   Past Medical History:  Diagnosis Date  . Hypertension   . Seizures (HCC)     PAST SURGICAL HISTORY:   Past Surgical History:  Procedure Laterality Date  . APPENDECTOMY    . CHOLECYSTECTOMY    . TUBAL LIGATION      SOCIAL HISTORY:   Social History    Tobacco Use  . Smoking status: Current Every Day Smoker    Types: Cigarettes  . Smokeless tobacco: Never Used  Substance Use Topics  . Alcohol use: Yes    Comment: monthly    FAMILY HISTORY:  History reviewed. No pertinent family history.  DRUG ALLERGIES:  No Known Allergies  REVIEW OF SYSTEMS:   Review of Systems  Constitutional: Positive for malaise/fatigue. Negative for chills, diaphoresis, fever and weight loss.  HENT: Negative for congestion, ear pain, hearing loss, nosebleeds, sinus pain, sore throat and tinnitus.   Eyes: Negative for blurred vision, double vision and photophobia.  Respiratory: Negative for cough, hemoptysis, sputum production, shortness of breath and wheezing.   Cardiovascular: Negative for chest pain, palpitations, orthopnea, claudication, leg swelling and PND.  Gastrointestinal: Positive for abdominal pain and diarrhea. Negative for blood in stool, constipation, heartburn, melena, nausea and vomiting.  Genitourinary: Negative for dysuria, frequency, hematuria and urgency.  Musculoskeletal: Negative for back pain, joint pain, myalgias and neck pain.  Skin: Negative for itching and rash.  Neurological: Positive for speech change, focal weakness and weakness. Negative for dizziness, tingling, tremors, sensory change, seizures, loss of consciousness and headaches.  Psychiatric/Behavioral: Negative for memory loss. The patient does not have insomnia.    MEDICATIONS AT HOME:   Prior to Admission medications   Medication Sig Start Date End Date Taking? Authorizing Provider  acetaminophen (TYLENOL) 325 MG tablet Take 2 tablets (650 mg total) by mouth every 6 (six) hours as needed. Patient not taking: Reported on 04/28/2018 04/03/18   Sharman CheekStafford, Phillip, MD  albuterol (PROVENTIL HFA;VENTOLIN HFA) 108 [phone](90 Base) MCG/ACT inhaler Inhale 1-2 puffs into the lungs every 4 (four) hours as needed for wheezing or shortness of breath. Patient not taking: Reported on  04/28/2018 01/20/17   Rockne MenghiniNorman, Anne-Caroline, MD  benzonatate (TESSALON PERLES) 100 MG capsule Take 1 capsule (100 mg total) by mouth every 6 (six) hours as needed for cough. Patient not taking: Reported on 04/28/2018 01/20/17   Rockne MenghiniNorman, Anne-Caroline, MD  cyclobenzaprine (FLEXERIL) 10 MG tablet Take 1 tablet (10 mg total) by mouth 3 (three) times daily as needed. Patient not taking: Reported on 04/28/2018 01/02/17   Joni ReiningSmith, Ronald K, PA-C  ibuprofen (ADVIL,MOTRIN) 600 MG tablet Take 1 tablet (600 mg total) by mouth every 6 (six) hours as needed. Patient not taking: Reported on 04/28/2018 07/08/17   Enid DerryWagner, Ashley, PA-C  naproxen (NAPROSYN) 500 MG tablet Take 1 tablet (500 mg total) by mouth 2 (two) times daily with a meal. Patient not taking: Reported on 04/28/2018 04/03/18   Sharman CheekStafford, Phillip, MD  ondansetron (ZOFRAN) 4 MG tablet Take 1 tablet (4 mg total) by mouth every 8 (eight) hours as needed for nausea or vomiting. Patient not taking: Reported on 04/28/2018 01/14/18   Phineas SemenGoodman, Graydon, MD  traMADol (ULTRAM) 50 MG tablet Take 1 tablet (50 mg total) by mouth every 6 (six) hours as needed for moderate pain. Patient not taking: Reported on 04/28/2018 01/02/17   Joni ReiningSmith, Ronald K, PA-C      VITAL SIGNS:  Blood pressure (!) 144/92, pulse 64, temperature 97.7 F (36.5 C), resp. rate 12, weight 108.9 kg (240 lb), SpO2 98 %.  PHYSICAL EXAMINATION:  Physical Exam  Constitutional: She is oriented to person, place, and time. She appears well-developed and well-nourished. She is active and cooperative.  Non-toxic appearance. She does not have a sickly appearance. She does not appear ill. No distress. She is not intubated.  HENT:  Head: Normocephalic and atraumatic.  Mouth/Throat: Oropharynx is clear and moist. Mucous membranes are dry. No oropharyngeal exudate.  Eyes: Conjunctivae, EOM and lids are normal. No scleral icterus.  Neck: Normal range of motion. Neck supple. No JVD present. No thyromegaly present.   Cardiovascular: Normal rate, regular rhythm, S1 normal, S2 normal and normal heart sounds.  No extrasystoles are present. Exam reveals no gallop, no S3, no S4, no distant heart sounds and no friction rub.  No murmur heard. Pulmonary/Chest: Effort normal. No accessory muscle usage or stridor. No apnea, no tachypnea and no bradypnea. She is not intubated. No respiratory distress. She has decreased breath sounds in the right upper field, the right middle field, the right lower field, the left upper field, the left middle field and the left lower field. She has no wheezes. She has no rhonchi. She has no rales.  Abdominal: Soft. Bowel sounds are normal. She exhibits no distension. There is no tenderness. There is no rebound and no guarding.  Musculoskeletal: She exhibits no edema or tenderness.  Lymphadenopathy:    She has no cervical adenopathy.  Neurological: She is alert and oriented to person, place, and time. She is not disoriented.  No sensory deficits to crude touch. Motor strength deficit LUE + LLE (RUE 5/5, RLE 5/5, LUE 3/5, LLE 3/5). Cranial nerves intact. DTRs normal, symmetrical. (-) dysmetria/dysdiadochokinesia.  Skin: Skin is warm, dry and intact. No  rash noted. She is not diaphoretic. No erythema.  Psychiatric: She has a normal mood and affect. Her behavior is normal. Judgment and thought content normal. Her speech is delayed. Her speech is not slurred. Cognition and memory are normal. She is communicative.   LABORATORY PANEL:   CBC Recent Labs  Lab 04/27/18 2225  WBC 7.0  HGB 13.0  HCT 38.3  PLT 283   ------------------------------------------------------------------------------------------------------------------  Chemistries  Recent Labs  Lab 04/27/18 2225  NA 137  K 3.9  CL 104  CO2 25  GLUCOSE 88  BUN 13  CREATININE 0.86  CALCIUM 8.7*  AST 24  ALT 18  ALKPHOS 56  BILITOT 0.4    ------------------------------------------------------------------------------------------------------------------  Cardiac Enzymes Recent Labs  Lab 04/27/18 2225  TROPONINI <0.03   ------------------------------------------------------------------------------------------------------------------  RADIOLOGY:  Ct Angio Head W/cm &/or Wo Cm  Result Date: 04/28/2018 CLINICAL DATA:  Initial evaluation for acute aphasia, inability to speak. EXAM: CT ANGIOGRAPHY HEAD AND NECK TECHNIQUE: Multidetector CT imaging of the head and neck was performed using the standard protocol during bolus administration of intravenous contrast. Multiplanar CT image reconstructions and MIPs were obtained to evaluate the vascular anatomy. Carotid stenosis measurements (when applicable) are obtained utilizing NASCET criteria, using the distal internal carotid diameter as the denominator. CONTRAST:  75mL ISOVUE-370 IOPAMIDOL (ISOVUE-370) INJECTION 76% COMPARISON:  Prior noncontrast head CT from earlier the same day. FINDINGS: CTA NECK FINDINGS Aortic arch: Visualized aortic arch of normal caliber with normal branch pattern. No flow-limiting stenosis about the origin of the great vessels. Visualized subclavian arteries widely patent. Right carotid system: Right common and internal carotid arteries widely patent without stenosis, dissection, or occlusion. No significant atheromatous disease about the right carotid bifurcation. Left carotid system: Left common and internal carotid arteries widely patent without stenosis, dissection, or occlusion. No significant atheromatous disease about the left carotid bifurcation. Vertebral arteries: Both of the vertebral arteries arise from the subclavian arteries. Left vertebral artery slightly dominant. Vertebral arteries widely patent within the neck without stenosis, dissection or occlusion. Skeleton: No acute osseous abnormality. No discrete lytic or blastic osseous lesions. Mild cervical  spondylolysis at C4-5 through C6-7. Other neck: No acute soft tissue abnormality within the neck. Salivary glands within normal limits. No adenopathy. Thyroid normal. Upper chest: Visualized upper chest demonstrates no acute finding. Visualized lungs are clear. Review of the MIP images confirms the above findings CTA HEAD FINDINGS Anterior circulation: Internal carotid arteries widely patent to the termini without stenosis or significant atheromatous irregularity. A1 segments, anterior communicating artery, and anterior cerebral arteries widely patent to their distal aspects without stenosis. M1 segments widely patent without stenosis. Normal MCA bifurcations. No proximal M2 occlusion. Distal MCA branches well perfused and symmetric. Posterior circulation: Vertebral arteries widely patent to the vertebrobasilar junction without stenosis. Left vertebral artery slightly dominant. Right PICA patent. Left PICA not visualized. Basilar widely patent to its distal aspect. Superior cerebral arteries patent bilaterally. Both of the posterior cerebral arteries primarily supplied via the basilar and are widely patent to their distal aspects without stenosis. Venous sinuses: Patent. Anatomic variants: None significant. Ill-defined contrast opacification adjacent to the cavernous left ICA favored to reflect asymmetric filling of the left cavernous sinus. No discrete aneurysm. Delayed phase: No abnormal enhancement. Review of the MIP images confirms the above findings IMPRESSION: Negative CTA of the head and neck. No large vessel occlusion. No hemodynamically significant or correctable stenosis. Electronically Signed   By: Rise Mu M.D.   On: 04/28/2018 01:23   Ct  Angio Neck W And/or Wo Contrast  Result Date: 04/28/2018 CLINICAL DATA:  Initial evaluation for acute aphasia, inability to speak. EXAM: CT ANGIOGRAPHY HEAD AND NECK TECHNIQUE: Multidetector CT imaging of the head and neck was performed using the  standard protocol during bolus administration of intravenous contrast. Multiplanar CT image reconstructions and MIPs were obtained to evaluate the vascular anatomy. Carotid stenosis measurements (when applicable) are obtained utilizing NASCET criteria, using the distal internal carotid diameter as the denominator. CONTRAST:  75mL ISOVUE-370 IOPAMIDOL (ISOVUE-370) INJECTION 76% COMPARISON:  Prior noncontrast head CT from earlier the same day. FINDINGS: CTA NECK FINDINGS Aortic arch: Visualized aortic arch of normal caliber with normal branch pattern. No flow-limiting stenosis about the origin of the great vessels. Visualized subclavian arteries widely patent. Right carotid system: Right common and internal carotid arteries widely patent without stenosis, dissection, or occlusion. No significant atheromatous disease about the right carotid bifurcation. Left carotid system: Left common and internal carotid arteries widely patent without stenosis, dissection, or occlusion. No significant atheromatous disease about the left carotid bifurcation. Vertebral arteries: Both of the vertebral arteries arise from the subclavian arteries. Left vertebral artery slightly dominant. Vertebral arteries widely patent within the neck without stenosis, dissection or occlusion. Skeleton: No acute osseous abnormality. No discrete lytic or blastic osseous lesions. Mild cervical spondylolysis at C4-5 through C6-7. Other neck: No acute soft tissue abnormality within the neck. Salivary glands within normal limits. No adenopathy. Thyroid normal. Upper chest: Visualized upper chest demonstrates no acute finding. Visualized lungs are clear. Review of the MIP images confirms the above findings CTA HEAD FINDINGS Anterior circulation: Internal carotid arteries widely patent to the termini without stenosis or significant atheromatous irregularity. A1 segments, anterior communicating artery, and anterior cerebral arteries widely patent to their distal  aspects without stenosis. M1 segments widely patent without stenosis. Normal MCA bifurcations. No proximal M2 occlusion. Distal MCA branches well perfused and symmetric. Posterior circulation: Vertebral arteries widely patent to the vertebrobasilar junction without stenosis. Left vertebral artery slightly dominant. Right PICA patent. Left PICA not visualized. Basilar widely patent to its distal aspect. Superior cerebral arteries patent bilaterally. Both of the posterior cerebral arteries primarily supplied via the basilar and are widely patent to their distal aspects without stenosis. Venous sinuses: Patent. Anatomic variants: None significant. Ill-defined contrast opacification adjacent to the cavernous left ICA favored to reflect asymmetric filling of the left cavernous sinus. No discrete aneurysm. Delayed phase: No abnormal enhancement. Review of the MIP images confirms the above findings IMPRESSION: Negative CTA of the head and neck. No large vessel occlusion. No hemodynamically significant or correctable stenosis. Electronically Signed   By: Rise Mu M.D.   On: 04/28/2018 01:23   Dg Chest Port 1 View  Result Date: 04/28/2018 CLINICAL DATA:  Weakness and inability to speak. EXAM: PORTABLE CHEST 1 VIEW COMPARISON:  04/03/2018 FINDINGS: The heart size and mediastinal contours are within normal limits. Both lungs are clear. The visualized skeletal structures are unremarkable. IMPRESSION: No active disease.  Stable chest. Electronically Signed   By: Tollie Eth M.D.   On: 04/28/2018 00:59   Ct Head Code Stroke Wo Contrast  Result Date: 04/27/2018 CLINICAL DATA:  Code stroke.  Initial evaluation for acute aphasia. EXAM: CT HEAD WITHOUT CONTRAST TECHNIQUE: Contiguous axial images were obtained from the base of the skull through the vertex without intravenous contrast. COMPARISON:  Prior head CT from 09/10/2014. FINDINGS: Brain: Cerebral volume within normal limits. No acute intracranial hemorrhage.  No acute large vessel territory infarct. No  mass lesion, midline shift or mass effect. No hydrocephalus. No extra-axial fluid collection. Vascular: No hyperdense vessel. Skull: Scalp soft tissues within normal limits.  Calvarium intact. Sinuses/Orbits: Globes orbital soft tissues within normal limits. Paranasal sinuses are clear. No mastoid effusion. Other: None. ASPECTS [facility] Stroke Program Early CT Score) - Ganglionic level infarction (caudate, lentiform nuclei, internal capsule, insula, M1-M3 cortex): 7 - Supraganglionic infarction (M4-M6 cortex): 3 Total score (0-10 with 10 being normal): 10 IMPRESSION: 1. Negative head CT.  No acute intracranial abnormality identified. 2. ASPECTS is 10. Critical Value/emergent results were called by telephone at the time of interpretation on 04/27/2018 at 11:47 pm to Dr. Lesly Rubenstein SUNG , who verbally acknowledged these results. Electronically Signed   By: Rise Mu M.D.   On: 04/27/2018 23:47   IMPRESSION AND PLAN:   A/P: 82F p/w aphasia + L-sided weakness x<6hrs, tPA declined. Eval for acute CVA. Also w/ recent AP/diarrhea. -Admit for CVA evaluation -Tele, continuous cardiac monitoring -EKG sinus -Neuro checks -Fall precautions -Seizure precautions -ASA81 -Check Lipids, HbA1c -MRI Brain w/o contrast -Echo -Not ordered for carotid U/S, already had (-) CTA head/neck -DVT PPx -Permissive HTN (SBP < 180) -PT, OT, SLP -Bedside swallow eval, NPO w/ aspiration precautions until/unless passed -Inpt Neurology consult -Encourage smoking cessation -Labs largely WNL, SIRS (-). Monitor for continued/worsening GI symptoms -Not on any home meds -NPO for now -Lovenox -Full code -Admission, > 2 midnights    All the records are reviewed and case discussed with ED provider. Management plans discussed with the patient, family and they are in agreement.  CODE STATUS: Full code  TOTAL TIME TAKING CARE OF THIS PATIENT: 90 minutes.    [name]  M.D on 04/28/2018 at 2:00 AM  Between 7am to 6pm - Pager - [phone]  After 6pm go to [email or web address] - Social research officer, government  [facility] [place] [facility]  Office  [phone]  CC: Primary care physician; Patient, No Pcp Per   Note: This dictation was prepared with Dragon dictation along with smaller phrase technology. Any transcriptional errors that result from this process are unintentional.

## 2018-04-28 NOTE — Progress Notes (Signed)
Lovenox changed to 40 mg BID for BMI >40 and CrCl >30. 

## 2018-04-29 LAB — HIV ANTIBODY (ROUTINE TESTING W REFLEX): HIV Screen 4th Generation wRfx: NONREACTIVE

## 2018-07-15 ENCOUNTER — Emergency Department
Admission: EM | Admit: 2018-07-15 | Discharge: 2018-07-15 | Disposition: A | Discharge status: Home / Self Care | Payer: PRIVATE HEALTH INSURANCE | Attending: Emergency Medicine | Admitting: Emergency Medicine

## 2018-07-15 ENCOUNTER — Encounter: Payer: Self-pay | Admitting: Emergency Medicine

## 2018-07-15 ENCOUNTER — Other Ambulatory Visit: Payer: Self-pay

## 2018-07-15 DIAGNOSIS — Z7982 Long term (current) use of aspirin: Secondary | ICD-10-CM | POA: Diagnosis not present

## 2018-07-15 DIAGNOSIS — F17210 Nicotine dependence, cigarettes, uncomplicated: Secondary | ICD-10-CM | POA: Diagnosis not present

## 2018-07-15 DIAGNOSIS — Z0000 Encounter for general adult medical examination without abnormal findings: Secondary | ICD-10-CM | POA: Insufficient documentation

## 2018-07-15 DIAGNOSIS — I10 Essential (primary) hypertension: Secondary | ICD-10-CM | POA: Diagnosis not present

## 2018-07-15 LAB — GLUCOSE, CAPILLARY: Glucose-Capillary: 101 mg/dL — ABNORMAL HIGH (ref 70–99)

## 2018-07-15 NOTE — ED Provider Notes (Signed)
[facility] Emergency Department Provider Note   ____________________________________________   First MD Initiated Contact with Patient 07/15/18 1118     (approximate)  I have reviewed the triage vital signs and the nursing notes.   HISTORY  Chief Complaint Letter for School/Work    HPI Barbara Petersen is a 47 y.o. female patient requests medical evaluation return back to work.  Patient states she was sent home from work 5 days ago.  Patient states she had him to go back to work yesterday and was told she need medical clearance to return to work.  Patient states she works in a hot environment and believes she may became dehydrated at work.  Patient state she feels better after increase oral hydration.   Past Medical History:  Diagnosis Date  . Hypertension   . Seizures [facility])     Patient Active Problem List   Diagnosis Date Noted  . Aphasia 04/28/2018    Past Surgical History:  Procedure Laterality Date  . APPENDECTOMY    . CHOLECYSTECTOMY    . TUBAL LIGATION      Prior to Admission medications   Medication Sig Start Date End Date Taking? Authorizing Provider  aspirin EC 81 MG EC tablet Take 1 tablet (81 mg total) by mouth daily. 04/29/18   Enedina Finner, MD    Allergies Patient has no known allergies.  No family history on file.  Social History Social History   Tobacco Use  . Smoking status: Current Every Day Smoker    Types: Cigarettes  . Smokeless tobacco: Never Used  Substance Use Topics  . Alcohol use: Yes    Comment: monthly  . Drug use: No    Review of Systems Constitutional: No fever/chills Eyes: No visual changes. ENT: No sore throat. Cardiovascular: Denies chest pain. Respiratory: Denies shortness of breath. Gastrointestinal: No abdominal pain.  No nausea, no vomiting.  No diarrhea.  No constipation. Genitourinary: Negative for dysuria. Musculoskeletal: Negative for back pain. Skin: Negative for rash. Neurological:  Negative for headaches, focal weakness or numbness.   ____________________________________________   PHYSICAL EXAM:  VITAL SIGNS: ED Triage Vitals  Enc Vitals Group     BP 07/15/18 1100 (!) 148/83     Pulse Rate 07/15/18 1100 83     Resp 07/15/18 1100 16     Temp 07/15/18 1100 98.4 F (36.9 C)     Temp Source 07/15/18 1100 Oral     SpO2 07/15/18 1100 99 %     Weight 07/15/18 1057 240 lb (108.9 kg)     Height 07/15/18 1057 5\' 4"  (1.626 m)     Head Circumference --      Peak Flow --      Pain Score 07/15/18 1057 0     Pain Loc --      Pain Edu? --      Excl. in GC? --    Constitutional: Alert and oriented. Well appearing and in no acute distress. Eyes: Conjunctivae are normal. PERRL. EOMI. Mouth/Throat: Mucous membranes are moist.  Oropharynx non-erythematous. Cardiovascular: Normal rate, regular rhythm. Grossly normal heart sounds.  Good peripheral circulation. Respiratory: Normal respiratory effort.  No retractions. Lungs CTAB. Neurologic:  Normal speech and language. No gross focal neurologic deficits are appreciated. No gait instability. Skin:  Skin is warm, dry and intact. No rash noted. Psychiatric: Mood and affect are normal. Speech and behavior are normal.  ____________________________________________   LABS (all labs ordered are listed, but only abnormal results are displayed)  Labs Reviewed  GLUCOSE, CAPILLARY - Abnormal; Notable for the following components:      Result Value   Glucose-Capillary 101 (*)    All other components within normal limits  CBG MONITORING, ED   ____________________________________________  EKG   ____________________________________________  RADIOLOGY  ED MD interpretation:    Official radiology report(s): No results found.  ____________________________________________   PROCEDURES  Procedure(s) performed:   Procedures  Critical Care performed: No  ____________________________________________   INITIAL  IMPRESSION / ASSESSMENT AND PLAN / ED COURSE  As part of my medical decision making, I reviewed the following data within the electronic MEDICAL RECORD NUMBER    Patient request medical clearance to return to work.      ____________________________________________   FINAL CLINICAL IMPRESSION(S) / ED DIAGNOSES  Final diagnoses:  Evaluation by medical service required     ED Discharge Orders    None       Note:  This document was prepared using Dragon voice recognition software and may include unintentional dictation errors.    Joni ReiningSmith, Ronald K, PA-C 07/15/18 1152    Emily FilbertWilliams, Jonathan E, MD 07/15/18 [phone]

## 2018-07-15 NOTE — ED Notes (Signed)
See triage note  States she was out of work b/c she did not feel well  Needs to be seen for a note to be able to go back to work  Afebrile on arrival

## 2018-07-15 NOTE — ED Triage Notes (Signed)
Pt states she wasn't feeling well on Friday, felt weak and tired, slightly dehydrated. Is feeling better now, but work is requiring a note for clearance. Pt denies pain, is in no distress.

## 2018-08-05 ENCOUNTER — Emergency Department: Payer: PRIVATE HEALTH INSURANCE

## 2018-08-05 ENCOUNTER — Other Ambulatory Visit: Payer: Self-pay

## 2018-08-05 DIAGNOSIS — Z7982 Long term (current) use of aspirin: Secondary | ICD-10-CM | POA: Diagnosis not present

## 2018-08-05 DIAGNOSIS — I10 Essential (primary) hypertension: Secondary | ICD-10-CM | POA: Diagnosis not present

## 2018-08-05 DIAGNOSIS — R1012 Left upper quadrant pain: Secondary | ICD-10-CM | POA: Diagnosis present

## 2018-08-05 DIAGNOSIS — R1084 Generalized abdominal pain: Secondary | ICD-10-CM | POA: Diagnosis not present

## 2018-08-05 DIAGNOSIS — K5900 Constipation, unspecified: Secondary | ICD-10-CM | POA: Insufficient documentation

## 2018-08-05 DIAGNOSIS — F17210 Nicotine dependence, cigarettes, uncomplicated: Secondary | ICD-10-CM | POA: Diagnosis not present

## 2018-08-05 LAB — POCT PREGNANCY, URINE: PREG TEST UR: NEGATIVE

## 2018-08-05 LAB — CBC
HCT: 39.1 % (ref 35.0–47.0)
HEMOGLOBIN: 13.3 g/dL (ref 12.0–16.0)
MCH: 30 pg (ref 26.0–34.0)
MCHC: 34.1 g/dL (ref 32.0–36.0)
MCV: 87.9 fL (ref 80.0–100.0)
Platelets: 258 10*3/uL (ref 150–440)
RBC: 4.45 MIL/uL (ref 3.80–5.20)
RDW: 15.3 % — ABNORMAL HIGH (ref 11.5–14.5)
WBC: 7.4 10*3/uL (ref 3.6–11.0)

## 2018-08-05 NOTE — ED Triage Notes (Addendum)
Pt in with co upper abd pain since Tuesday states feels like her abd might be distended. Does have hx of constipation unsure when last time she had normal BM. Pt denies any vomiting or dysuria.

## 2018-08-06 ENCOUNTER — Emergency Department
Admission: EM | Admit: 2018-08-06 | Discharge: 2018-08-06 | Disposition: A | Discharge status: Home / Self Care | Payer: PRIVATE HEALTH INSURANCE | Attending: Emergency Medicine | Admitting: Emergency Medicine

## 2018-08-06 DIAGNOSIS — R1084 Generalized abdominal pain: Secondary | ICD-10-CM

## 2018-08-06 DIAGNOSIS — K5900 Constipation, unspecified: Secondary | ICD-10-CM

## 2018-08-06 LAB — URINALYSIS, COMPLETE (UACMP) WITH MICROSCOPIC
BACTERIA UA: NONE SEEN
BILIRUBIN URINE: NEGATIVE
Glucose, UA: NEGATIVE mg/dL
Ketones, ur: NEGATIVE mg/dL
LEUKOCYTES UA: NEGATIVE
NITRITE: NEGATIVE
PH: 6 (ref 5.0–8.0)
Protein, ur: NEGATIVE mg/dL
SPECIFIC GRAVITY, URINE: 1.015 (ref 1.005–1.030)

## 2018-08-06 LAB — COMPREHENSIVE METABOLIC PANEL
ALK PHOS: 56 U/L (ref 38–126)
ALT: 19 U/L (ref 0–44)
AST: 24 U/L (ref 15–41)
Albumin: 4.3 g/dL (ref 3.5–5.0)
Anion gap: 6 (ref 5–15)
BUN: 14 mg/dL (ref 6–20)
CALCIUM: 9 mg/dL (ref 8.9–10.3)
CO2: 27 mmol/L (ref 22–32)
CREATININE: 0.92 mg/dL (ref 0.44–1.00)
Chloride: 103 mmol/L (ref 98–111)
Glucose, Bld: 104 mg/dL — ABNORMAL HIGH (ref 70–99)
Potassium: 4 mmol/L (ref 3.5–5.1)
Sodium: 136 mmol/L (ref 135–145)
Total Bilirubin: 0.4 mg/dL (ref 0.3–1.2)
Total Protein: 7.7 g/dL (ref 6.5–8.1)

## 2018-08-06 LAB — LIPASE, BLOOD: LIPASE: 34 U/L (ref 11–51)

## 2018-08-06 MED ORDER — LACTULOSE 10 GM/15ML PO SOLN
30.0000 g | Freq: Once | ORAL | Status: AC
  Administered 2018-08-06: 30 g via ORAL
  Filled 2018-08-06: qty 60

## 2018-08-06 MED ORDER — LACTULOSE 10 GM/15ML PO SOLN: 20.0000 g | Freq: Every day | ORAL | 0 refills | Status: DC | PRN

## 2018-08-06 NOTE — Discharge Instructions (Addendum)
1.  You may take laxative (Lactulose) as needed for bowel movements. 2.  Here are my recommendations for regulating bowel movements - all of these products are found over-the-counter:  Take daily MiraLAX Take daily stool softener Take daily Metamucil or other fiber supplement Increase your fluids daily  3.  Return to the ER for worsening symptoms, persistent vomiting, difficulty breathing or other concerns.

## 2018-08-06 NOTE — ED Provider Notes (Signed)
[facility] Emergency Department Provider Note   ____________________________________________   First MD Initiated Contact with Patient 08/06/18 0119     (approximate)  I have reviewed the triage vital signs and the nursing notes.   HISTORY  Chief Complaint Abdominal Pain    HPI Barbara Petersen is a 47 y.o. female who presents to the ED from home with a chief complaint of abdominal pain and constipation.  Patient has a history of chronic constipation for which she often needs to use enemas.  States her last good bowel movement was over 1 week ago.  She has had small nuggets of stool over the course of the week.  Did an enema 2 days ago.  Feels bloated with abdominal pain mainly in her left upper quadrant.  Denies associated fever, chills, chest pain, shortness of breath, nausea, vomiting, diarrhea.  Denies recent travel or trauma.   Past Medical History:  Diagnosis Date  . Hypertension   . Seizures [facility](HCC)     Patient Active Problem List   Diagnosis Date Noted  . Aphasia 04/28/2018    Past Surgical History:  Procedure Laterality Date  . APPENDECTOMY    . CHOLECYSTECTOMY    . TUBAL LIGATION      Prior to Admission medications   Medication Sig Start Date End Date Taking? Authorizing Provider  aspirin EC 81 MG EC tablet Take 1 tablet (81 mg total) by mouth daily. 04/29/18   Enedina FinnerPatel, Sona, MD    Allergies Patient has no known allergies.  No family history on file.  Social History Social History   Tobacco Use  . Smoking status: Current Every Day Smoker    Types: Cigarettes  . Smokeless tobacco: Never Used  Substance Use Topics  . Alcohol use: Yes    Comment: monthly  . Drug use: No    Review of Systems  Constitutional: No fever/chills Eyes: No visual changes. ENT: No sore throat. Cardiovascular: Denies chest pain. Respiratory: Denies shortness of breath. Gastrointestinal: Positive for abdominal pain.  No nausea, no vomiting.  No  diarrhea.  Positive for constipation. Genitourinary: Negative for dysuria. Musculoskeletal: Negative for back pain. Skin: Negative for rash. Neurological: Negative for headaches, focal weakness or numbness.   ____________________________________________   PHYSICAL EXAM:  VITAL SIGNS: ED Triage Vitals  Enc Vitals Group     BP --      Pulse Rate 08/05/18 2329 75     Resp 08/05/18 2329 20     Temp 08/05/18 2329 98.9 F (37.2 C)     Temp Source 08/05/18 2329 Oral     SpO2 08/05/18 2329 100 %     Weight 08/05/18 2330 220 lb (99.8 kg)     Height 08/05/18 2330 5\' 4"  (1.626 m)     Head Circumference --      Peak Flow --      Pain Score 08/05/18 2330 7     Pain Loc --      Pain Edu? --      Excl. in GC? --     Constitutional: Alert and oriented. Well appearing and in mild acute distress. Eyes: Conjunctivae are normal. PERRL. EOMI. Head: Atraumatic. Nose: No congestion/rhinnorhea. Mouth/Throat: Mucous membranes are moist.  Oropharynx non-erythematous. Neck: No stridor.   Cardiovascular: Normal rate, regular rhythm. Grossly normal heart sounds.  Good peripheral circulation. Respiratory: Normal respiratory effort.  No retractions. Lungs CTAB. Gastrointestinal: Soft and minimally diffusely tender to palpation without rebound or guarding.  Mild distention. No abdominal  bruits. No CVA tenderness. Musculoskeletal: No lower extremity tenderness nor edema.  No joint effusions. Neurologic:  Normal speech and language. No gross focal neurologic deficits are appreciated. No gait instability. Skin:  Skin is warm, dry and intact. No rash noted. Psychiatric: Mood and affect are normal. Speech and behavior are normal.  ____________________________________________   LABS (all labs ordered are listed, but only abnormal results are displayed)  Labs Reviewed  CBC - Abnormal; Notable for the following components:      Result Value   RDW 15.3 (*)    All other components within normal limits    COMPREHENSIVE METABOLIC PANEL - Abnormal; Notable for the following components:   Glucose, Bld 104 (*)    All other components within normal limits  URINALYSIS, COMPLETE (UACMP) WITH MICROSCOPIC - Abnormal; Notable for the following components:   Color, Urine STRAW (*)    APPearance CLEAR (*)    Hgb urine dipstick MODERATE (*)    All other components within normal limits  LIPASE, BLOOD  POCT PREGNANCY, URINE  POC URINE PREG, ED   ____________________________________________  EKG  None ____________________________________________  RADIOLOGY  ED MD interpretation: Moderate stool burden  Official radiology report(s): Dg Abdomen 1 View  Result Date: 08/06/2018 CLINICAL DATA:  Upper abdominal pain since Tuesday with abdominal distention. History of constipation. EXAM: ABDOMEN - 1 VIEW COMPARISON:  10/24/2008 FINDINGS: Gas and stool throughout the colon. No small or large bowel distention. No radiopaque stones. Calcified phleboliths in the pelvis. Intrauterine device. Degenerative changes in the spine. Soft tissue contours appear intact. IMPRESSION: Normal nonobstructive bowel gas pattern. Electronically Signed   By: Burman Nieves M.D.   On: 08/06/2018 00:25    ____________________________________________   PROCEDURES  Procedure(s) performed: None  Procedures  Critical Care performed: No  ____________________________________________   INITIAL IMPRESSION / ASSESSMENT AND PLAN / ED COURSE  As part of my medical decision making, I reviewed the following data within the electronic MEDICAL RECORD NUMBER Nursing notes reviewed and incorporated, Labs reviewed, Radiograph reviewed and Notes from prior ED visits   47 year old female with chronic constipation who presents with no good bowel movement in over 1 week.  Differential diagnosis includes but is not limited to fecal impaction, ileus, SBO, constipation, etc.  Laboratory and urinalysis results unremarkable.  I reviewed  patient's x-rays with her personally.  Will administer soapsuds enema, oral lactulose and reassess.  Clinical Course as of Aug 06 248  Thu Aug 06, 2018  0248 Patient starting to have bowel movements after enema.  Will discharge home with a prescription for lactulose and recommendations for a bowel regimen.  Strict return precautions given.  Patient verbalizes understanding agrees with plan of care.   [JS]    Clinical Course User Index [JS] Irean Hong, MD     ____________________________________________   FINAL CLINICAL IMPRESSION(S) / ED DIAGNOSES  Final diagnoses:  Generalized abdominal pain  Constipation, unspecified constipation type     ED Discharge Orders    None       Note:  This document was prepared using Dragon voice recognition software and may include unintentional dictation errors.    Irean Hong, MD 08/06/18 [phone]

## 2018-11-06 ENCOUNTER — Encounter: Payer: Self-pay | Admitting: Emergency Medicine

## 2018-11-06 ENCOUNTER — Emergency Department
Admission: EM | Admit: 2018-11-06 | Discharge: 2018-11-07 | Disposition: A | Discharge status: Home / Self Care | Payer: PRIVATE HEALTH INSURANCE | Attending: Emergency Medicine | Admitting: Emergency Medicine

## 2018-11-06 ENCOUNTER — Emergency Department: Payer: PRIVATE HEALTH INSURANCE

## 2018-11-06 ENCOUNTER — Other Ambulatory Visit: Payer: Self-pay

## 2018-11-06 DIAGNOSIS — Y998 Other external cause status: Secondary | ICD-10-CM | POA: Insufficient documentation

## 2018-11-06 DIAGNOSIS — Y9301 Activity, walking, marching and hiking: Secondary | ICD-10-CM | POA: Insufficient documentation

## 2018-11-06 DIAGNOSIS — S82841A Displaced bimalleolar fracture of right lower leg, initial encounter for closed fracture: Secondary | ICD-10-CM | POA: Insufficient documentation

## 2018-11-06 DIAGNOSIS — F17210 Nicotine dependence, cigarettes, uncomplicated: Secondary | ICD-10-CM | POA: Insufficient documentation

## 2018-11-06 DIAGNOSIS — X509XXA Other and unspecified overexertion or strenuous movements or postures, initial encounter: Secondary | ICD-10-CM | POA: Insufficient documentation

## 2018-11-06 DIAGNOSIS — Y92524 Gas station as the place of occurrence of the external cause: Secondary | ICD-10-CM | POA: Insufficient documentation

## 2018-11-06 DIAGNOSIS — I10 Essential (primary) hypertension: Secondary | ICD-10-CM | POA: Insufficient documentation

## 2018-11-06 DIAGNOSIS — S99911A Unspecified injury of right ankle, initial encounter: Secondary | ICD-10-CM | POA: Diagnosis present

## 2018-11-06 MED ORDER — OXYCODONE-ACETAMINOPHEN 5-325 MG PO TABS
1.0000 | ORAL_TABLET | Freq: Once | ORAL | Status: AC
  Administered 2018-11-06: 1 via ORAL
  Filled 2018-11-06: qty 1

## 2018-11-06 MED ORDER — ONDANSETRON HCL 4 MG/2ML IJ SOLN
4.0000 mg | Freq: Once | INTRAMUSCULAR | Status: AC
  Administered 2018-11-06: 4 mg via INTRAVENOUS
  Filled 2018-11-06: qty 2

## 2018-11-06 MED ORDER — OXYCODONE-ACETAMINOPHEN 2.5-325 MG PO TABS: 1.0000 | ORAL_TABLET | ORAL | 0 refills | Status: DC | PRN

## 2018-11-06 MED ORDER — PROPOFOL 10 MG/ML IV BOLUS
80.0000 mg | Freq: Once | INTRAVENOUS | Status: AC
  Administered 2018-11-06: 80 mg via INTRAVENOUS
  Filled 2018-11-06: qty 20

## 2018-11-06 MED ORDER — MORPHINE SULFATE (PF) 4 MG/ML IV SOLN
4.0000 mg | Freq: Once | INTRAVENOUS | Status: AC
  Administered 2018-11-06: 4 mg via INTRAVENOUS
  Filled 2018-11-06: qty 1

## 2018-11-06 NOTE — ED Provider Notes (Addendum)
[facility] Emergency Department Provider Note  ____________________________________________   First MD Initiated Contact with Patient 11/06/18 2153     (approximate)  I have reviewed the triage vital signs and the nursing notes.   HISTORY  Chief Complaint Ankle Pain   HPI Barbara Petersen is a 47 y.o. female with a history of hypertension seizures not on any medication was presented to the emergency department for right ankle pain.  The patient says that she was at a gas station, stepped over the hose and landed on her right ankle awkwardly.  She reported sudden onset pain and then was transported to the emergency department.  Patient says the last time she has eaten was 9:30 AM.  York Spaniel she had a small mouth drink at approximately 6 PM.  Denies any other pain or sites of injury.    Past Medical History:  Diagnosis Date  . Hypertension   . Seizures [facility])     Patient Active Problem List   Diagnosis Date Noted  . Aphasia 04/28/2018    Past Surgical History:  Procedure Laterality Date  . APPENDECTOMY    . CHOLECYSTECTOMY    . TUBAL LIGATION      Prior to Admission medications   Medication Sig Start Date End Date Taking? Authorizing Provider  aspirin EC 81 MG EC tablet Take 1 tablet (81 mg total) by mouth daily. Patient not taking: Reported on 11/06/2018 04/29/18   Enedina Finner, MD  lactulose (CHRONULAC) 10 GM/15ML solution Take 30 mLs (20 g total) by mouth daily as needed for mild constipation. Patient not taking: Reported on 11/06/2018 08/06/18   Irean Hong, MD    Allergies Patient has no known allergies.  No family history on file.  Social History Social History   Tobacco Use  . Smoking status: Current Every Day Smoker    Types: Cigarettes  . Smokeless tobacco: Never Used  Substance Use Topics  . Alcohol use: Yes    Comment: monthly  . Drug use: No    Review of Systems  Constitutional: No fever/chills Eyes: No visual  changes. ENT: No sore throat. Cardiovascular: Denies chest pain. Respiratory: Denies shortness of breath. Gastrointestinal: No abdominal pain.  No nausea, no vomiting.  No diarrhea.  No constipation. Genitourinary: Negative for dysuria. Musculoskeletal: Negative for back pain. Skin: Negative for rash. Neurological: Negative for headaches, focal weakness or numbness.   ____________________________________________   PHYSICAL EXAM:  VITAL SIGNS: ED Triage Vitals  Enc Vitals Group     BP --      Pulse Rate 11/06/18 2157 87     Resp 11/06/18 2157 (!) 24     Temp 11/06/18 2157 98.3 F (36.8 C)     Temp Source 11/06/18 2157 Oral     SpO2 11/06/18 2157 100 %     Weight 11/06/18 2155 230 lb (104.3 kg)     Height 11/06/18 2155 5\' 4"  (1.626 m)     Head Circumference --      Peak Flow --      Pain Score 11/06/18 2155 10     Pain Loc --      Pain Edu? --      Excl. in GC? --     Constitutional: Alert and oriented. Well appearing and in no acute distress. Eyes: Conjunctivae are normal.  Head: Atraumatic. Nose: No congestion/rhinnorhea. Mouth/Throat: Mucous membranes are moist.  Neck: No stridor.   Cardiovascular: Normal rate, regular rhythm. Grossly normal heart sounds.  Respiratory: Normal  respiratory effort.  No retractions. Lungs CTAB. Gastrointestinal: Soft and nontender. No distention.  Musculoskeletal: Visible right ankle deformity with posterior as well as lateral displacement of the right foot with tenderness palpation throughout the ankle associated with diffuse swelling.  Distal to the ankle there is no tenderness on the foot.  The patient is neurovascular intact with intact dorsalis pedis pulse as well as sensation intact light touch to the foot and full range of motion to the toes.  Neurologic:  Normal speech and language. No gross focal neurologic deficits are appreciated. Skin:  Skin is warm, dry and intact. No rash noted. Psychiatric: Mood and affect are normal.  Speech and behavior are normal.  ____________________________________________   LABS (all labs ordered are listed, but only abnormal results are displayed)  Labs Reviewed - No data to display ____________________________________________  EKG   ____________________________________________  RADIOLOGY  Appears to have a bimalleolar fracture of the fibula as well as the posterior tibia  Successful reduction of the fracture on postreduction films. ____________________________________________   PROCEDURES  Procedure(s) performed: At [facility].   .Sedation Date/Time: 11/06/2018 10:47 PM Performed by: Myrna BlazerSchaevitz, Janille Draughon Matthew, MD Authorized by: Myrna BlazerSchaevitz, Marshelle Bilger Matthew, MD   Consent:    Consent obtained:  Written (electronic informed consent)   Consent given by:  Patient   Risks discussed:  Allergic reaction, dysrhythmia, inadequate sedation, nausea, vomiting, respiratory compromise necessitating ventilatory assistance and intubation, prolonged sedation necessitating reversal and prolonged hypoxia resulting in organ damage Universal protocol:    Procedure explained and questions answered to patient or proxy's satisfaction: yes     Relevant documents present and verified: yes     Test results available and properly labeled: yes     Imaging studies available: yes     Required blood products, implants, devices, and special equipment available: yes     Immediately prior to procedure a time out was called: yes     Patient identity confirmation method:  Arm band Indications:    Procedure performed:  Fracture reduction Pre-sedation assessment:    Time since last food or drink:  4.5hrs   ASA classification: class 1 - normal, healthy patient     Neck mobility: normal     Mouth opening:  3 or more finger widths   Mallampati score:  II - soft palate, uvula, fauces visible   Pre-sedation assessments completed and reviewed: airway patency, cardiovascular  function, mental status and respiratory function     Pre-sedation assessment completed:  11/06/2018 10:48 PM Immediate pre-procedure details:    Reassessment: Patient reassessed immediately prior to procedure     Reviewed: vital signs, relevant labs/tests and NPO status     Verified: bag valve mask available, emergency equipment available, intubation equipment available, IV patency confirmed, oxygen available, reversal medications available and suction available   Procedure details (see MAR for exact dosages):    Preoxygenation:  Nasal cannula   Sedation:  Propofol   Intra-procedure monitoring:  Blood pressure monitoring, continuous pulse oximetry, cardiac monitor, frequent vital sign checks and frequent LOC assessments   Total Provider sedation time (minutes):  5 Post-procedure details:    Post-sedation assessment completed:  11/06/2018 11:31 PM   Attendance: Constant attendance by certified staff until patient recovered     Recovery: Patient returned to pre-procedure baseline     Post-sedation assessments completed and reviewed: airway patency, cardiovascular function, hydration status, mental status and respiratory function     Patient is stable for discharge or admission: yes  Patient tolerance:  Tolerated well, no immediate complications Comments:     Reduction performed and then patient placed in a short leg posterior as well as stirrup splint.  Patient neurovascular intact with brisk capillary refill as well as ability to move her toes and sensation that is intact light touch.  She says that the splint does not feel overly tight.    Critical Care performed:   ____________________________________________   INITIAL IMPRESSION / ASSESSMENT AND PLAN / ED COURSE  Pertinent labs & imaging results that were available during my care of the patient were reviewed by me and considered in my medical decision making (see chart for details).  DDX: Ankle sprain, ankle dislocation,  bimalleolar fracture, trimalleolar fracture As part of my medical decision making, I reviewed the following data within the electronic MEDICAL RECORD NUMBER Notes from prior ED visits  ----------------------------------------- 10:49 PM on 11/06/2018 -----------------------------------------  I discussed the case with Dr. Rosita KeaMenz of orthopedics who would like us to reduce the fracture and then have the patient follow-up in the office on Monday for likely repair in the operating room this coming Tuesday.  Patient made aware of the plans and discussed the procedure of sedation as well as reduction and splinting and follow-up with orthopedics.  We discussed side effect/complications.  She is agreed to the procedure.  Consent signed.  ----------------------------------------- 11:32 PM on 11/06/2018 -----------------------------------------  Patient now back to baseline after deep sedation with propofol.  Satisfactory reduction.  Patient will follow-up on Monday in the office with Dr. Rosita KeaMenz.  She was advised to call forcing the morning for same-day appointment for likely surgical repair on Tuesday.  She will be given crutches as well as Percocet for discharge.  She is understanding of the diagnosis well treatment plan willing to comply. ____________________________________________   FINAL CLINICAL IMPRESSION(S) / ED DIAGNOSES  Bimalleolar fracture/dislocation.  NEW MEDICATIONS STARTED DURING THIS VISIT:  New Prescriptions   No medications on file     Note:  This document was prepared using Dragon voice recognition software and may include unintentional dictation errors.     Myrna BlazerSchaevitz, Kasiah Manka Matthew, MD 11/06/18 2333    Myrna BlazerSchaevitz, Moody Robben Matthew, MD 11/06/18 2337    Myrna BlazerSchaevitz, Raylee Strehl Matthew, MD 11/23/18 2110

## 2018-11-06 NOTE — ED Triage Notes (Signed)
Pt arrives POV to triage with c/o right ankle injury. Pt reports that she was stepping over a gas hose and felt her ankle move. Pt has obvious deformity to left ankle at this time.

## 2018-11-09 LAB — SAMPLE TO BLOOD BANK

## 2018-11-10 ENCOUNTER — Encounter: Payer: Self-pay | Admitting: *Deleted

## 2018-11-10 ENCOUNTER — Ambulatory Visit
Admission: RE | Admit: 2018-11-10 | Discharge: 2018-11-10 | Disposition: A | Discharge status: Home / Self Care | Payer: PRIVATE HEALTH INSURANCE | Attending: Orthopedic Surgery | Admitting: Orthopedic Surgery

## 2018-11-10 ENCOUNTER — Ambulatory Visit: Payer: PRIVATE HEALTH INSURANCE

## 2018-11-10 ENCOUNTER — Other Ambulatory Visit: Payer: Self-pay

## 2018-11-10 ENCOUNTER — Ambulatory Visit: Payer: PRIVATE HEALTH INSURANCE | Admitting: Anesthesiology

## 2018-11-10 ENCOUNTER — Encounter
Admission: RE | Disposition: A | Discharge status: Home / Self Care | Payer: Self-pay | Source: Home / Self Care | Attending: Orthopedic Surgery

## 2018-11-10 DIAGNOSIS — S82841A Displaced bimalleolar fracture of right lower leg, initial encounter for closed fracture: Secondary | ICD-10-CM | POA: Insufficient documentation

## 2018-11-10 DIAGNOSIS — Z79899 Other long term (current) drug therapy: Secondary | ICD-10-CM | POA: Insufficient documentation

## 2018-11-10 DIAGNOSIS — I10 Essential (primary) hypertension: Secondary | ICD-10-CM | POA: Insufficient documentation

## 2018-11-10 DIAGNOSIS — W1839XA Other fall on same level, initial encounter: Secondary | ICD-10-CM | POA: Insufficient documentation

## 2018-11-10 DIAGNOSIS — R569 Unspecified convulsions: Secondary | ICD-10-CM | POA: Insufficient documentation

## 2018-11-10 DIAGNOSIS — Z419 Encounter for procedure for purposes other than remedying health state, unspecified: Secondary | ICD-10-CM

## 2018-11-10 DIAGNOSIS — Y9289 Other specified places as the place of occurrence of the external cause: Secondary | ICD-10-CM | POA: Insufficient documentation

## 2018-11-10 DIAGNOSIS — F17200 Nicotine dependence, unspecified, uncomplicated: Secondary | ICD-10-CM | POA: Insufficient documentation

## 2018-11-10 DIAGNOSIS — Y9389 Activity, other specified: Secondary | ICD-10-CM | POA: Insufficient documentation

## 2018-11-10 HISTORY — PX: ORIF ANKLE FRACTURE: SHX5408

## 2018-11-10 LAB — POCT PREGNANCY, URINE: Preg Test, Ur: NEGATIVE

## 2018-11-10 SURGERY — OPEN REDUCTION INTERNAL FIXATION (ORIF) ANKLE FRACTURE
Anesthesia: General | Laterality: Right

## 2018-11-10 MED ORDER — HYDROMORPHONE HCL 1 MG/ML IJ SOLN
INTRAMUSCULAR | Status: AC
  Administered 2018-11-10: 0.25 mg via INTRAVENOUS
  Filled 2018-11-10: qty 1

## 2018-11-10 MED ORDER — FAMOTIDINE 20 MG PO TABS
20.0000 mg | ORAL_TABLET | Freq: Once | ORAL | Status: AC
  Administered 2018-11-10: 20 mg via ORAL

## 2018-11-10 MED ORDER — ONDANSETRON HCL 4 MG/2ML IJ SOLN: 4.0000 mg | Freq: Four times a day (QID) | INTRAMUSCULAR | Status: DC | PRN

## 2018-11-10 MED ORDER — FENTANYL CITRATE (PF) 100 MCG/2ML IJ SOLN
INTRAMUSCULAR | Status: AC
  Administered 2018-11-10: 25 ug via INTRAVENOUS
  Filled 2018-11-10: qty 2

## 2018-11-10 MED ORDER — METOCLOPRAMIDE HCL 10 MG PO TABS: 5.0000 mg | ORAL_TABLET | Freq: Three times a day (TID) | ORAL | Status: DC | PRN

## 2018-11-10 MED ORDER — ROCURONIUM BROMIDE 100 MG/10ML IV SOLN
INTRAVENOUS | Status: DC | PRN
  Administered 2018-11-10: 10 mg via INTRAVENOUS
  Administered 2018-11-10: 40 mg via INTRAVENOUS

## 2018-11-10 MED ORDER — GLYCOPYRROLATE 0.2 MG/ML IJ SOLN
INTRAMUSCULAR | Status: DC | PRN
  Administered 2018-11-10: 0.2 mg via INTRAVENOUS

## 2018-11-10 MED ORDER — CEFAZOLIN SODIUM-DEXTROSE 2-4 GM/100ML-% IV SOLN
2.0000 g | Freq: Once | INTRAVENOUS | Status: AC
  Administered 2018-11-10: 2 g via INTRAVENOUS

## 2018-11-10 MED ORDER — HYDROCODONE-ACETAMINOPHEN 5-325 MG PO TABS: 1.0000 | ORAL_TABLET | ORAL | Status: DC | PRN

## 2018-11-10 MED ORDER — SUGAMMADEX SODIUM 200 MG/2ML IV SOLN
INTRAVENOUS | Status: DC | PRN
  Administered 2018-11-10: 208.6 mg via INTRAVENOUS

## 2018-11-10 MED ORDER — SUCCINYLCHOLINE CHLORIDE 20 MG/ML IJ SOLN
INTRAMUSCULAR | Status: DC | PRN
  Administered 2018-11-10: 120 mg via INTRAVENOUS

## 2018-11-10 MED ORDER — SUGAMMADEX SODIUM 200 MG/2ML IV SOLN
INTRAVENOUS | Status: AC
  Filled 2018-11-10: qty 2

## 2018-11-10 MED ORDER — ONDANSETRON HCL 4 MG/2ML IJ SOLN
INTRAMUSCULAR | Status: AC
  Filled 2018-11-10: qty 2

## 2018-11-10 MED ORDER — LIDOCAINE HCL (CARDIAC) PF 100 MG/5ML IV SOSY
PREFILLED_SYRINGE | INTRAVENOUS | Status: DC | PRN
  Administered 2018-11-10: 100 mg via INTRAVENOUS

## 2018-11-10 MED ORDER — FENTANYL CITRATE (PF) 100 MCG/2ML IJ SOLN
INTRAMUSCULAR | Status: AC
  Filled 2018-11-10: qty 4

## 2018-11-10 MED ORDER — LACTATED RINGERS IV SOLN
INTRAVENOUS | Status: DC
  Administered 2018-11-10 (×3): via INTRAVENOUS

## 2018-11-10 MED ORDER — OXYCODONE-ACETAMINOPHEN 7.5-325 MG PO TABS
1.0000 | ORAL_TABLET | ORAL | Status: DC | PRN
  Administered 2018-11-10: 1 via ORAL

## 2018-11-10 MED ORDER — DEXAMETHASONE SODIUM PHOSPHATE 10 MG/ML IJ SOLN
INTRAMUSCULAR | Status: AC
  Filled 2018-11-10: qty 1

## 2018-11-10 MED ORDER — SUCCINYLCHOLINE CHLORIDE 20 MG/ML IJ SOLN
INTRAMUSCULAR | Status: AC
  Filled 2018-11-10: qty 1

## 2018-11-10 MED ORDER — ONDANSETRON HCL 4 MG/2ML IJ SOLN
INTRAMUSCULAR | Status: DC | PRN
  Administered 2018-11-10: 4 mg via INTRAVENOUS

## 2018-11-10 MED ORDER — HYDROMORPHONE HCL 1 MG/ML IJ SOLN
INTRAMUSCULAR | Status: AC
  Administered 2018-11-10: 0.5 mg via INTRAVENOUS
  Filled 2018-11-10: qty 1

## 2018-11-10 MED ORDER — CEFAZOLIN SODIUM-DEXTROSE 2-4 GM/100ML-% IV SOLN
INTRAVENOUS | Status: AC
  Filled 2018-11-10: qty 100

## 2018-11-10 MED ORDER — MIDAZOLAM HCL 2 MG/2ML IJ SOLN
INTRAMUSCULAR | Status: AC
  Filled 2018-11-10: qty 2

## 2018-11-10 MED ORDER — OXYCODONE-ACETAMINOPHEN 7.5-325 MG PO TABS
ORAL_TABLET | ORAL | Status: AC
  Administered 2018-11-10: 1 via ORAL
  Filled 2018-11-10: qty 1

## 2018-11-10 MED ORDER — ROCURONIUM BROMIDE 50 MG/5ML IV SOLN
INTRAVENOUS | Status: AC
  Filled 2018-11-10: qty 1

## 2018-11-10 MED ORDER — FENTANYL CITRATE (PF) 100 MCG/2ML IJ SOLN
25.0000 ug | INTRAMUSCULAR | Status: AC | PRN
  Administered 2018-11-10 (×6): 25 ug via INTRAVENOUS

## 2018-11-10 MED ORDER — PROPOFOL 10 MG/ML IV BOLUS
INTRAVENOUS | Status: DC | PRN
  Administered 2018-11-10: 150 mg via INTRAVENOUS

## 2018-11-10 MED ORDER — ACETAMINOPHEN 10 MG/ML IV SOLN
INTRAVENOUS | Status: DC | PRN
  Administered 2018-11-10: 1000 mg via INTRAVENOUS

## 2018-11-10 MED ORDER — ONDANSETRON HCL 4 MG/2ML IJ SOLN: 4.0000 mg | Freq: Once | INTRAMUSCULAR | Status: DC | PRN

## 2018-11-10 MED ORDER — ONDANSETRON HCL 4 MG PO TABS: 4.0000 mg | ORAL_TABLET | Freq: Four times a day (QID) | ORAL | Status: DC | PRN

## 2018-11-10 MED ORDER — HYDROMORPHONE HCL 1 MG/ML IJ SOLN
0.2500 mg | INTRAMUSCULAR | Status: DC | PRN
  Administered 2018-11-10: 0.25 mg via INTRAVENOUS
  Administered 2018-11-10 (×3): 0.5 mg via INTRAVENOUS

## 2018-11-10 MED ORDER — OXYCODONE-ACETAMINOPHEN 7.5-325 MG PO TABS: 1.0000 | ORAL_TABLET | ORAL | 0 refills | Status: DC | PRN

## 2018-11-10 MED ORDER — LIDOCAINE HCL (PF) 2 % IJ SOLN
INTRAMUSCULAR | Status: AC
  Filled 2018-11-10: qty 10

## 2018-11-10 MED ORDER — PROPOFOL 10 MG/ML IV BOLUS
INTRAVENOUS | Status: AC
  Filled 2018-11-10: qty 20

## 2018-11-10 MED ORDER — METOCLOPRAMIDE HCL 5 MG/ML IJ SOLN: 5.0000 mg | Freq: Three times a day (TID) | INTRAMUSCULAR | Status: DC | PRN

## 2018-11-10 MED ORDER — DEXAMETHASONE SODIUM PHOSPHATE 10 MG/ML IJ SOLN
INTRAMUSCULAR | Status: DC | PRN
  Administered 2018-11-10: 10 mg via INTRAVENOUS

## 2018-11-10 MED ORDER — SODIUM CHLORIDE 0.9 % IV SOLN: INTRAVENOUS | Status: DC

## 2018-11-10 MED ORDER — FAMOTIDINE 20 MG PO TABS
ORAL_TABLET | ORAL | Status: AC
  Filled 2018-11-10: qty 1

## 2018-11-10 MED ORDER — MIDAZOLAM HCL 2 MG/2ML IJ SOLN
INTRAMUSCULAR | Status: DC | PRN
  Administered 2018-11-10: 2 mg via INTRAVENOUS

## 2018-11-10 MED ORDER — KETOROLAC TROMETHAMINE 30 MG/ML IJ SOLN
INTRAMUSCULAR | Status: AC
  Administered 2018-11-10: 30 mg
  Filled 2018-11-10: qty 1

## 2018-11-10 MED ORDER — FENTANYL CITRATE (PF) 100 MCG/2ML IJ SOLN
INTRAMUSCULAR | Status: DC | PRN
  Administered 2018-11-10 (×4): 50 ug via INTRAVENOUS

## 2018-11-10 SURGICAL SUPPLY — 66 items
BANDAGE ACE 4X5 VEL STRL LF (GAUZE/BANDAGES/DRESSINGS) ×6 IMPLANT
BIT DRILL 2.5 X LONG (BIT) ×1
BIT DRILL X LONG 2.5 (BIT) IMPLANT
CANISTER SUCT 1200ML W/VALVE (MISCELLANEOUS) ×3 IMPLANT
CHLORAPREP W/TINT 26ML (MISCELLANEOUS) ×3 IMPLANT
COVER WAND RF STERILE (DRAPES) ×3 IMPLANT
CUFF TOURN 24 STER (MISCELLANEOUS) IMPLANT
CUFF TOURN 30 STER DUAL PORT (MISCELLANEOUS) ×2 IMPLANT
DRAPE C-ARM XRAY 36X54 (DRAPES) ×2 IMPLANT
DRAPE C-ARMOR (DRAPES) ×2 IMPLANT
DRAPE EXTREMITY 106X87X128.5 (DRAPES) ×2 IMPLANT
DRAPE FLUOR MINI C-ARM 54X84 (DRAPES) ×1 IMPLANT
DRAPE IMP U-DRAPE 54X76 (DRAPES) ×2 IMPLANT
DRAPE INCISE IOBAN 66X45 STRL (DRAPES) ×3 IMPLANT
DRAPE U-SHAPE 47X51 STRL (DRAPES) ×3 IMPLANT
DRILL BIT X LONG 2.5 (BIT) ×3
DRSG EMULSION OIL 3X8 NADH (GAUZE/BANDAGES/DRESSINGS) ×3 IMPLANT
ELECT CAUTERY BLADE 6.4 (BLADE) ×3 IMPLANT
ELECT REM PT RETURN 9FT ADLT (ELECTROSURGICAL) ×3
ELECTRODE REM PT RTRN 9FT ADLT (ELECTROSURGICAL) ×1 IMPLANT
GAUZE PETRO XEROFOAM 1X8 (MISCELLANEOUS) ×3 IMPLANT
GAUZE SPONGE 4X4 12PLY STRL (GAUZE/BANDAGES/DRESSINGS) ×3 IMPLANT
GLOVE SURG SYN 9.0  PF PI (GLOVE) ×2
GLOVE SURG SYN 9.0 PF PI (GLOVE) ×1 IMPLANT
GOWN SRG 2XL LVL 4 RGLN SLV (GOWNS) ×1 IMPLANT
GOWN STRL NON-REIN 2XL LVL4 (GOWNS) ×3
GOWN STRL REUS W/ TWL LRG LVL3 (GOWN DISPOSABLE) ×1 IMPLANT
GOWN STRL REUS W/TWL LRG LVL3 (GOWN DISPOSABLE) ×3
HEMOVAC 400ML (MISCELLANEOUS)
KIT 1/3 TUB PL 4H 49M (Orthopedic Implant) IMPLANT
KIT 1/3 TUB PL 6H 73M (Orthopedic Implant) IMPLANT
KIT DRAIN HEMOVAC JP 7FR 400ML (MISCELLANEOUS) IMPLANT
KIT TURNOVER KIT A (KITS) ×3 IMPLANT
LABEL OR SOLS (LABEL) ×3 IMPLANT
NS IRRIG 1000ML POUR BTL (IV SOLUTION) ×3 IMPLANT
PACK EXTREMITY ARMC (MISCELLANEOUS) ×3 IMPLANT
PAD ABD DERMACEA PRESS 5X9 (GAUZE/BANDAGES/DRESSINGS) ×6 IMPLANT
PAD CAST CTTN 4X4 STRL (SOFTGOODS) ×2 IMPLANT
PAD PREP 24X41 OB/GYN DISP (PERSONAL CARE ITEMS) ×3 IMPLANT
PADDING CAST COTTON 4X4 STRL (SOFTGOODS) ×6
PROS 1/3 TUB PL 4H 49M (Orthopedic Implant) ×3 IMPLANT
PROS 1/3 TUB PL 6H 73M (Orthopedic Implant) ×3 IMPLANT
SCALPEL PROTECTED #15 DISP (BLADE) ×6 IMPLANT
SCREW CORT FT 32X3.5XNONLOCK (Screw) IMPLANT
SCREW CORTICAL 3.5MM  16MM (Screw) ×4 IMPLANT
SCREW CORTICAL 3.5MM  28MM (Screw) ×2 IMPLANT
SCREW CORTICAL 3.5MM  30MM (Screw) IMPLANT
SCREW CORTICAL 3.5MM  32MM (Screw) ×2 IMPLANT
SCREW CORTICAL 3.5MM 14MM (Screw) ×2 IMPLANT
SCREW CORTICAL 3.5MM 16MM (Screw) IMPLANT
SCREW CORTICAL 3.5MM 28MM (Screw) IMPLANT
SCREW CORTICAL 3.5MM 30MM (Screw) IMPLANT
SCREW CORTICAL 3.5MM 32MM (Screw) ×1 IMPLANT
SCREW CORTICAL 3.5MM 36MM (Screw) ×2 IMPLANT
SPLINT CAST 1 STEP 5X30 WHT (MISCELLANEOUS) ×3 IMPLANT
SPONGE LAP 18X18 RF (DISPOSABLE) ×3 IMPLANT
STAPLER SKIN PROX 35W (STAPLE) ×3 IMPLANT
SUT ETHILON 3-0 FS-10 30 BLK (SUTURE) ×3
SUT MNCRL AB 4-0 PS2 18 (SUTURE) ×6 IMPLANT
SUT VIC AB 0 CT1 36 (SUTURE) ×3 IMPLANT
SUT VIC AB 2-0 SH 27 (SUTURE) ×6
SUT VIC AB 2-0 SH 27XBRD (SUTURE) ×2 IMPLANT
SUT VIC AB 3-0 SH 27 (SUTURE) ×3
SUT VIC AB 3-0 SH 27X BRD (SUTURE) ×1 IMPLANT
SUTURE EHLN 3-0 FS-10 30 BLK (SUTURE) ×1 IMPLANT
SYR 10ML LL (SYRINGE) ×3 IMPLANT

## 2018-11-10 NOTE — Anesthesia Preprocedure Evaluation (Signed)
Anesthesia Evaluation  Patient identified by MRN, date of birth, ID band Patient awake    Reviewed: Allergy & Precautions, NPO status , Patient's Chart, lab work & pertinent test results  Airway Mallampati: IV  TM Distance: >3 FB     Dental  (+) Teeth Intact   Pulmonary Current Smoker,    Pulmonary exam normal        Cardiovascular hypertension, Normal cardiovascular exam     Neuro/Psych Seizures -, Well Controlled,  negative psych ROS   GI/Hepatic negative GI ROS, Neg liver ROS,   Endo/Other  negative endocrine ROS  Renal/GU negative Renal ROS  negative genitourinary   Musculoskeletal   Abdominal Normal abdominal exam  (+)   Peds negative pediatric ROS (+)  Hematology negative hematology ROS (+)   Anesthesia Other Findings Past Medical History: No date: Hypertension No date: Seizures (HCC)  Reproductive/Obstetrics                             Anesthesia Physical Anesthesia Plan  ASA: II  Anesthesia Plan: General   Post-op Pain Management:    Induction: Intravenous  PONV Risk Score and Plan:   Airway Management Planned: Oral ETT  Additional Equipment:   Intra-op Plan:   Post-operative Plan: Extubation in OR  Informed Consent: I have reviewed the patients History and Physical, chart, labs and discussed the procedure including the risks, benefits and alternatives for the proposed anesthesia with the patient or authorized representative who has indicated his/her understanding and acceptance.   Dental advisory given  Plan Discussed with: CRNA and Surgeon  Anesthesia Plan Comments:         Anesthesia Quick Evaluation

## 2018-11-10 NOTE — Transfer of Care (Signed)
Immediate Anesthesia Transfer of Care Note  Patient: Barbara Petersen  Procedure(s) Performed: OPEN REDUCTION INTERNAL FIXATION (ORIF) ANKLE FRACTURE (Right )  Patient Location: PACU  Anesthesia Type:General  Level of Consciousness: sedated  Airway & Oxygen Therapy: Patient Spontanous Breathing and Patient connected to face mask oxygen  Post-op Assessment: Report given to RN and Post -op Vital signs reviewed and stable  Post vital signs: Reviewed and stable  Last Vitals:  Vitals Value Taken Time  BP    Temp    Pulse 59 11/10/2018  9:32 AM  Resp 8 11/10/2018  9:32 AM  SpO2 98 % 11/10/2018  9:32 AM  Vitals shown include unvalidated device data.  Last Pain:  Vitals:   11/10/18 0637  TempSrc: Oral  PainSc: 10-Worst pain ever      Patients Stated Pain Goal: 3 (11/10/18 16100637)  Complications: No apparent anesthesia complications

## 2018-11-10 NOTE — Anesthesia Postprocedure Evaluation (Signed)
Anesthesia Post Note  Patient: Barbara Petersen  Procedure(s) Performed: OPEN REDUCTION INTERNAL FIXATION (ORIF) ANKLE FRACTURE (Right )  Anesthesia Type: General Level of consciousness: awake and alert and oriented Pain management: pain level controlled Vital Signs Assessment: post-procedure vital signs reviewed and stable Respiratory status: spontaneous breathing Cardiovascular status: blood pressure returned to baseline Anesthetic complications: no     Last Vitals:  Vitals:   11/10/18 1216 11/10/18 1304  BP: 140/76 (!) 156/89  Pulse: 60 73  Resp:  20  Temp: 36.4 C   SpO2: 98% 97%    Last Pain:  Vitals:   11/10/18 1304  TempSrc:   PainSc: 10-Worst pain ever                 Barbara Petersen

## 2018-11-10 NOTE — Anesthesia Post-op Follow-up Note (Signed)
Anesthesia QCDR form completed.        

## 2018-11-10 NOTE — Discharge Instructions (Addendum)
° °  Keep leg elevated as much as possible.  Ice to the front and back of ankle may help today and tomorrow with pain and swelling.  Try to keep as much weight off the leg as possible.  Pain medicine as directed   AMBULATORY SURGERY  DISCHARGE INSTRUCTIONS   1) The drugs that you were given will stay in your system until tomorrow so for the next 24 hours you should not:  A) Drive an automobile B) Make any legal decisions C) Drink any alcoholic beverage   2) You may resume regular meals tomorrow.  Today it is better to start with liquids and gradually work up to solid foods.  You may eat anything you prefer, but it is better to start with liquids, then soup and crackers, and gradually work up to solid foods.   3) Please notify your doctor immediately if you have any unusual bleeding, trouble breathing, redness and pain at the surgery site, drainage, fever, or pain not relieved by medication.    4) Additional Instructions:        Please contact your physician with any problems or Same Day Surgery at [phone], Monday through Friday 6 am to 4 pm, or [place] at [facility] Main number at [phone].

## 2018-11-10 NOTE — H&P (Signed)
Reviewed paper H+P, will be scanned into chart. No changes noted.  

## 2018-11-10 NOTE — Op Note (Signed)
11/10/2018  9:27 AM  PATIENT:  Barbara Petersen  47 y.o. female  PRE-OPERATIVE DIAGNOSIS:  CLOSED BIMALLEOLAR FRACTURE OF RIGHT ANKLE  POST-OPERATIVE DIAGNOSIS:  CLOSED BIMALLEOLAR FRACTURE OF RIGHT ANKLE  PROCEDURE:  Procedure(s): OPEN REDUCTION INTERNAL FIXATION (ORIF) ANKLE FRACTURE (Right)  SURGEON: Leitha SchullerMichael J Maiyah Goyne, MD  ASSISTANTS: none  ANESTHESIA:   general  EBL:  Total I/O In: 1000 [I.V.:1000] Out: 25 [Blood:25]  BLOOD ADMINISTERED:none  DRAINS: none   LOCAL MEDICATIONS USED:  NONE  SPECIMEN:  No Specimen  DISPOSITION OF SPECIMEN:  N/A  COUNTS:  YES  TOURNIQUET:  55 minutes at 300 mmHg  IMPLANTS: Biomet nonlocking plates x2 with multiple cortical screws  DICTATION: .Dragon Dictation patient was brought to the operating room and after adequate general anesthesia was obtained she was placed prone.  The right leg was prepped and draped in the usual sterile fashion with a tourniquet applied to the upper thigh.  After patient identification and timeout procedures were completed, tourniquet was raised.  Posterior lateral approach was made to the ankle between the fibula and Achilles tendon with the peroneal muscles noted and being quite large the fascia was incised and the tendons and muscle belly were retracted laterally to expose the FHL muscle belly which was then elevated off the posterior aspect of the tibia.  C arm was used during the procedure to assess alignment and a 4-hole third tubular plate was applied posteriorly and with filling the screw holes the plate acted as a buttress to hold the posterior rim fragment in place.  Under stress views the ankle was stable with this fixation.  Next the tendon peroneal tendons were retracted more medially exposing the posterior aspect of the fibula and 6-hole plate was applied the with a leg screw at the fracture site and additional proximal and distal cortical screws giving rigid fixation with essentially anatomic alignment of the  fibula with the top screw hole left open since it was slightly off the fibula.  The ankle was stable to stress views AP and lateral imaging and is felt acceptable alignment have been obtained.  The wounds were irrigated and tourniquet let down the wound was closed with 2-0 Vicryl subcutaneously and skin staples followed by Xeroform 4 x 4's ABD web roll and a stirrup splint followed by an Ace wrap  PLAN OF CARE: Discharge to home after PACU  PATIENT DISPOSITION:  PACU - hemodynamically stable.

## 2018-11-10 NOTE — Anesthesia Procedure Notes (Signed)
Procedure Name: Intubation Date/Time: 11/10/2018 8:52 AM Performed by: Nelda Marseille, CRNA Pre-anesthesia Checklist: Patient identified, Patient being monitored, Timeout performed, Emergency Drugs available and Suction available Patient Re-evaluated:Patient Re-evaluated prior to induction Oxygen Delivery Method: Circle system utilized Preoxygenation: Pre-oxygenation with 100% oxygen Induction Type: IV induction Ventilation: Mask ventilation without difficulty Laryngoscope Size: Mac, 3 and McGraph Grade View: Grade III Tube type: Oral Tube size: 7.0 mm Number of attempts: 1 Airway Equipment and Method: Stylet Placement Confirmation: ETT inserted through vocal cords under direct vision,  positive ETCO2 and breath sounds checked- equal and bilateral Secured at: 21 cm Tube secured with: Tape Dental Injury: Teeth and Oropharynx as per pre-operative assessment  Difficulty Due To: Difficulty was anticipated, Difficult Airway- due to large tongue, Difficult Airway- due to reduced neck mobility and Difficult Airway- due to anterior larynx

## 2018-11-12 ENCOUNTER — Encounter: Payer: Self-pay | Admitting: Orthopedic Surgery

## 2018-11-15 ENCOUNTER — Emergency Department
Admission: EM | Admit: 2018-11-15 | Discharge: 2018-11-16 | Disposition: A | Discharge status: Home / Self Care | Payer: PRIVATE HEALTH INSURANCE | Attending: Emergency Medicine | Admitting: Emergency Medicine

## 2018-11-15 ENCOUNTER — Emergency Department: Payer: PRIVATE HEALTH INSURANCE

## 2018-11-15 ENCOUNTER — Encounter: Payer: Self-pay | Admitting: Emergency Medicine

## 2018-11-15 ENCOUNTER — Other Ambulatory Visit: Payer: Self-pay

## 2018-11-15 DIAGNOSIS — Z79899 Other long term (current) drug therapy: Secondary | ICD-10-CM | POA: Insufficient documentation

## 2018-11-15 DIAGNOSIS — K5903 Drug induced constipation: Secondary | ICD-10-CM

## 2018-11-15 DIAGNOSIS — Z7982 Long term (current) use of aspirin: Secondary | ICD-10-CM | POA: Insufficient documentation

## 2018-11-15 DIAGNOSIS — F17210 Nicotine dependence, cigarettes, uncomplicated: Secondary | ICD-10-CM | POA: Insufficient documentation

## 2018-11-15 MED ORDER — NALOXEGOL OXALATE 25 MG PO TABS: 25.0000 mg | ORAL_TABLET | Freq: Every day | ORAL | 0 refills | Status: DC

## 2018-11-15 MED ORDER — MAGNESIUM CITRATE PO SOLN
1.0000 | Freq: Once | ORAL | Status: AC
  Administered 2018-11-15: 1 via ORAL
  Filled 2018-11-15: qty 296

## 2018-11-15 MED ORDER — SORBITOL 70 % SOLN
960.0000 mL | TOPICAL_OIL | Freq: Once | ORAL | Status: AC
  Administered 2018-11-15: 960 mL via RECTAL
  Filled 2018-11-15: qty 473

## 2018-11-15 NOTE — ED Notes (Signed)
Pt medicated with mag citrate. Awaiting the smog enema from pharmacy.

## 2018-11-15 NOTE — ED Triage Notes (Signed)
Pt to ED via POV. Pt states that she had surgery on her foot on Tuesday, 12/24. Pt states that she has not had a bowel movement since 12/20. Pt has been taking pain medication for her foot. Pt has done 3 enemas and taken OTC stool softeners and laxatives without relief. Pt appears uncomfortable at this time.

## 2018-11-15 NOTE — ED Provider Notes (Signed)
[facility] Emergency Department Provider Note   ____________________________________________    I have reviewed the triage vital signs and the nursing notes.   HISTORY  Chief Complaint Constipation     HPI Barbara Petersen is a 47 y.o. female who presents with complaints of constipation.  Patient reports recent ankle injury which is required opioid treatment.  She reports he has not had a bowel movement in over 7 days.  Complains of pain when she tries have a bowel movement.  No nausea or vomiting.  Feels mildly bloated.  Has taken over-the-counter laxatives with little improvement   Past Medical History:  Diagnosis Date  . Hypertension   . Seizures [facility](HCC)     Patient Active Problem List   Diagnosis Date Noted  . Aphasia 04/28/2018    Past Surgical History:  Procedure Laterality Date  . APPENDECTOMY    . CHOLECYSTECTOMY    . ORIF ANKLE FRACTURE Right 11/10/2018   Procedure: OPEN REDUCTION INTERNAL FIXATION (ORIF) ANKLE FRACTURE;  Surgeon: Kennedy BuckerMenz, Michael, MD;  Location: [facility] ORS;  Service: Orthopedics;  Laterality: Right;  . TUBAL LIGATION      Prior to Admission medications   Medication Sig Start Date End Date Taking? Authorizing Provider  aspirin EC 81 MG EC tablet Take 1 tablet (81 mg total) by mouth daily. 04/29/18   Enedina FinnerPatel, Sona, MD  lactulose (CHRONULAC) 10 GM/15ML solution Take 30 mLs (20 g total) by mouth daily as needed for mild constipation. 08/06/18   Irean HongSung, Jade J, MD  naloxegol oxalate (MOVANTIK) 25 MG TABS tablet Take 1 tablet (25 mg total) by mouth daily. 11/15/18   Jene EveryKinner, Yohanna Tow, MD  oxycodone-acetaminophen (PERCOCET) 2.5-325 MG tablet Take 1 tablet by mouth every 4 (four) hours as needed for pain. 11/06/18   Schaevitz, Myra Rudeavid Matthew, MD  oxyCODONE-acetaminophen (PERCOCET) 7.5-325 MG tablet Take 1 tablet by mouth every 4 (four) hours as needed for severe pain. 11/10/18   Kennedy BuckerMenz, Michael, MD     Allergies Patient has no known  allergies.  No family history on file.  Social History Social History   Tobacco Use  . Smoking status: Current Every Day Smoker    Types: Cigarettes  . Smokeless tobacco: Never Used  Substance Use Topics  . Alcohol use: Yes    Comment: monthly  . Drug use: No    Review of Systems  Constitutional: No fever/chills  Cardiovascular: Denies chest pain. Respiratory: No cough Gastrointestinal: As above Genitourinary: Negative for dysuria. Musculoskeletal: Negative for back pain.     ____________________________________________   PHYSICAL EXAM:  VITAL SIGNS: ED Triage Vitals  Enc Vitals Group     BP 11/15/18 1821 115/85     Pulse Rate 11/15/18 1821 89     Resp 11/15/18 1821 20     Temp 11/15/18 1821 98.9 F (37.2 C)     Temp Source 11/15/18 1821 Oral     SpO2 11/15/18 1821 98 %     Weight 11/15/18 1822 104 kg (229 lb 4.5 oz)     Height 11/15/18 1822 1.626 m (5\' 4" )     Head Circumference --      Peak Flow --      Pain Score 11/15/18 1822 8     Pain Loc --      Pain Edu? --      Excl. in GC? --     Constitutional: Alert and oriented.     Respiratory: Normal respiratory effort.  No retractions.  Gastrointestinal: Soft  and nontender. No distention.  No CVA tenderness.  Musculoskeletal:   Warm and well perfused Neurologic:  Normal speech and language. No gross focal neurologic deficits are appreciated.  Skin:  Skin is warm, dry and intact. No rash noted. Psychiatric: Mood and affect are normal. Speech and behavior are normal.  ____________________________________________   LABS (all labs ordered are listed, but only abnormal results are displayed)  Labs Reviewed - No data to display ____________________________________________  EKG  None ____________________________________________  RADIOLOGY  Nonobstructive bowel gas pattern on KUB ____________________________________________   PROCEDURES  Procedure(s) performed:  No  Procedures   Critical Care performed: No ____________________________________________   INITIAL IMPRESSION / ASSESSMENT AND PLAN / ED COURSE  Pertinent labs & imaging results that were available during my care of the patient were reviewed by me and considered in my medical decision making (see chart for details).  Patient presents with constipation as above, reassuring abdominal exam.  We will give enema, magnesium citrate.  No fecal impaction.  Will prescribe Movantik    ____________________________________________   FINAL CLINICAL IMPRESSION(S) / ED DIAGNOSES  Final diagnoses:  Drug-induced constipation        Note:  This document was prepared using Dragon voice recognition software and may include unintentional dictation errors.   Jene Every, MD 11/15/18 [phone]

## 2018-11-16 NOTE — ED Notes (Signed)
This RN went in to discharge pt, pt was on toilet still attempting to have a bowel movement. Will try to discharge again in 15 mins.

## 2019-05-03 ENCOUNTER — Other Ambulatory Visit: Payer: Self-pay

## 2019-05-03 ENCOUNTER — Encounter
Admission: RE | Admit: 2019-05-03 | Discharge: 2019-05-03 | Disposition: A | Discharge status: Home / Self Care | Payer: PRIVATE HEALTH INSURANCE | Source: Ambulatory Visit | Attending: Orthopedic Surgery | Admitting: Orthopedic Surgery

## 2019-05-03 DIAGNOSIS — Z1159 Encounter for screening for other viral diseases: Secondary | ICD-10-CM | POA: Insufficient documentation

## 2019-05-03 DIAGNOSIS — Z01810 Encounter for preprocedural cardiovascular examination: Secondary | ICD-10-CM | POA: Insufficient documentation

## 2019-05-03 HISTORY — DX: Gastro-esophageal reflux disease without esophagitis: K21.9

## 2019-05-03 HISTORY — DX: Headache, unspecified: R51.9

## 2019-05-03 HISTORY — DX: Unspecified osteoarthritis, unspecified site: M19.90

## 2019-05-03 NOTE — Patient Instructions (Signed)
Your procedure is scheduled on: 05-06-19 THURSDAY Report to Same Day Surgery 2nd floor medical mall [facility] Entrance-take elevator on left to 2nd floor.  Check in with surgery information desk.) To find out your arrival time please call [phone] between 1PM - 3PM on 05-05-19 [facility]  Remember: Instructions that are not followed completely may result in serious medical risk, up to and including death, or upon the discretion of your surgeon and anesthesiologist your surgery may need to be rescheduled.    _x___ 1. Do not eat food after midnight the night before your procedure. NO GUM OR CANDY AFTER MIDNIGHT. You may drink clear liquids up to 2 hours before you are scheduled to arrive at the hospital for your procedure.  Do not drink clear liquids within 2 hours of your scheduled arrival to the hospital.  Clear liquids include  --Water or Apple juice without pulp  --Clear carbohydrate beverage such as ClearFast or Gatorade  --Black Coffee or Clear Tea (No milk, no creamers, do not add anything to the coffee or Tea   ____Ensure clear carbohydrate drink on the way to the hospital for bariatric patients  ____Ensure clear carbohydrate drink 3 hours before surgery for Dr Dwyane Luo patients if physician instructed.    __x__ 2. No Alcohol for 24 hours before or after surgery.   __x__3. No Smoking or e-cigarettes for 24 prior to surgery.  Do not use any chewable tobacco products for at least 6 hour prior to surgery   ____  4. Bring all medications with you on the day of surgery if instructed.    __x__ 5. Notify your doctor if there is any change in your medical condition     (cold, fever, infections).    x___6. On the morning of surgery brush your teeth with toothpaste and water.  You may rinse your mouth with mouth wash if you wish.  Do not swallow any toothpaste or mouthwash.   Do not wear jewelry, make-up, hairpins, clips or nail polish.  Do not wear lotions, powders, or perfumes.  You may wear deodorant.  Do not shave 48 hours prior to surgery. Men may shave face and neck.  Do not bring valuables to the hospital.    [facility] is not responsible for any belongings or valuables.               Contacts, dentures or bridgework may not be worn into surgery.  Leave your suitcase in the car. After surgery it may be brought to your room.  For patients admitted to the hospital, discharge time is determined by your treatment team.  _  Patients discharged the day of surgery will not be allowed to drive home.  You will need someone to drive you home and stay with you the night of your procedure.    Please read over the following fact sheets that you were given:   [facility] Preparing for Surgery   ____ Take anti-hypertensive listed below, cardiac, seizure, asthma, anti-reflux and psychiatric medicines. These include:  1. NONE  2.  3.  4.  5.  6.  ____Fleets enema or Magnesium Citrate as directed.   _x___ Use CHG Soap or sage wipes as directed on instruction sheet   ____ Use inhalers on the day of surgery and bring to hospital day of surgery  ____ Stop Metformin and Janumet 2 days prior to surgery.    ____ Take 1/2 of usual insulin dose the night before surgery and none on the  morning surgery.   ____ Follow recommendations from Cardiologist, Pulmonologist or PCP regarding stopping Aspirin, Coumadin, Plavix ,Eliquis, Effient, or Pradaxa, and Pletal.  X____Stop Anti-inflammatories such as Advil, Aleve, Ibuprofen, Motrin, Naproxen, Naprosyn, Goodies powders or aspirin products NOW-OK to take Tylenol    ____ Stop supplements until after surgery.    ____ Bring C-Pap to the hospital.

## 2019-05-04 LAB — NOVEL CORONAVIRUS, NAA (HOSP ORDER, SEND-OUT TO REF LAB; TAT 18-24 HRS): SARS-CoV-2, NAA: NOT DETECTED

## 2019-05-04 NOTE — Pre-Procedure Instructions (Signed)
Messaged Dr Kayleen Memos regarding pt with h/o seizures and htn who has not f/u with PCP or neurologist in years due to not having insurance and has been off seizure meds for years now and off bp meds for a few months. BP in our office was 165/86. Pt states that she has started having seizures again and her last one was last month. Pt states she also had a seizure during her appendectomy years ago. Dr Kayleen Memos wants pt to get medical clearance for recent seizures that have resumed. Pt has no pcp or neurologist. I called Myriam Jacobson at Dr Rudene Christians office and she said she will have an easier time getting pt in with a neurologist than PCP. I am faxing over clearance note to Myriam Jacobson so she can get pt set up with neurologist for her seizures

## 2019-05-05 ENCOUNTER — Encounter: Payer: Self-pay | Admitting: *Deleted

## 2019-05-05 NOTE — Pre-Procedure Instructions (Signed)
CALLED CASEY AT DR [facility] OFFICE REGARDING CLEARANCE-SHE STATES PT WAS TO HAVE A TELEMED VISIT AT 1130 THIS AM WITH A PA IN NEURO BUT SHE IS INSURE IF PT ANSWERED PHONE-PT TO CALL BACK ONCE SHE FINDS OUT ABOUT CLEARANCE

## 2019-05-10 ENCOUNTER — Other Ambulatory Visit
Admission: RE | Admit: 2019-05-10 | Discharge: 2019-05-10 | Disposition: A | Discharge status: Home / Self Care | Payer: HRSA Program | Source: Ambulatory Visit | Attending: Orthopedic Surgery | Admitting: Orthopedic Surgery

## 2019-05-10 DIAGNOSIS — Z1159 Encounter for screening for other viral diseases: Secondary | ICD-10-CM | POA: Diagnosis present

## 2019-05-11 DIAGNOSIS — R569 Unspecified convulsions: Secondary | ICD-10-CM | POA: Insufficient documentation

## 2019-05-11 LAB — NOVEL CORONAVIRUS, NAA (HOSP ORDER, SEND-OUT TO REF LAB; TAT 18-24 HRS): SARS-CoV-2, NAA: NOT DETECTED

## 2019-05-12 NOTE — Pre-Procedure Instructions (Signed)
Barbara Petersen at Dr [facility] office regarding neurology clearance. Pt is being seen @ 3:15 today in neurology office and will find out then if pt is cleared or not

## 2019-05-13 ENCOUNTER — Ambulatory Visit: Payer: Self-pay

## 2019-05-13 ENCOUNTER — Other Ambulatory Visit: Payer: Self-pay

## 2019-05-13 ENCOUNTER — Ambulatory Visit
Admission: RE | Admit: 2019-05-13 | Discharge: 2019-05-13 | Disposition: A | Discharge status: Home / Self Care | Payer: Self-pay | Source: Ambulatory Visit | Attending: Orthopedic Surgery | Admitting: Orthopedic Surgery

## 2019-05-13 ENCOUNTER — Encounter
Admission: RE | Disposition: A | Discharge status: Home / Self Care | Payer: Self-pay | Source: Ambulatory Visit | Attending: Orthopedic Surgery

## 2019-05-13 ENCOUNTER — Encounter: Payer: Self-pay | Admitting: *Deleted

## 2019-05-13 ENCOUNTER — Ambulatory Visit: Payer: Self-pay | Admitting: Certified Registered Nurse Anesthetist

## 2019-05-13 DIAGNOSIS — I10 Essential (primary) hypertension: Secondary | ICD-10-CM | POA: Insufficient documentation

## 2019-05-13 DIAGNOSIS — F17200 Nicotine dependence, unspecified, uncomplicated: Secondary | ICD-10-CM | POA: Insufficient documentation

## 2019-05-13 DIAGNOSIS — T8484XA Pain due to internal orthopedic prosthetic devices, implants and grafts, initial encounter: Secondary | ICD-10-CM | POA: Insufficient documentation

## 2019-05-13 DIAGNOSIS — Z7982 Long term (current) use of aspirin: Secondary | ICD-10-CM | POA: Insufficient documentation

## 2019-05-13 DIAGNOSIS — Y831 Surgical operation with implant of artificial internal device as the cause of abnormal reaction of the patient, or of later complication, without mention of misadventure at the time of the procedure: Secondary | ICD-10-CM | POA: Insufficient documentation

## 2019-05-13 DIAGNOSIS — K219 Gastro-esophageal reflux disease without esophagitis: Secondary | ICD-10-CM | POA: Insufficient documentation

## 2019-05-13 DIAGNOSIS — Z9889 Other specified postprocedural states: Secondary | ICD-10-CM

## 2019-05-13 HISTORY — PX: HARDWARE REMOVAL: SHX979

## 2019-05-13 LAB — POCT PREGNANCY, URINE: Preg Test, Ur: NEGATIVE

## 2019-05-13 SURGERY — REMOVAL, HARDWARE
Anesthesia: General | Laterality: Right

## 2019-05-13 MED ORDER — ONDANSETRON HCL 4 MG/2ML IJ SOLN
INTRAMUSCULAR | Status: DC | PRN
  Administered 2019-05-13: 4 mg via INTRAVENOUS

## 2019-05-13 MED ORDER — OXYCODONE HCL 5 MG PO TABS
5.0000 mg | ORAL_TABLET | Freq: Once | ORAL | Status: AC | PRN
  Administered 2019-05-13: 5 mg via ORAL

## 2019-05-13 MED ORDER — ONDANSETRON HCL 4 MG/2ML IJ SOLN: 4.0000 mg | Freq: Four times a day (QID) | INTRAMUSCULAR | Status: DC | PRN

## 2019-05-13 MED ORDER — PROPOFOL 10 MG/ML IV BOLUS
INTRAVENOUS | Status: AC
  Filled 2019-05-13: qty 20

## 2019-05-13 MED ORDER — FENTANYL CITRATE (PF) 100 MCG/2ML IJ SOLN
INTRAMUSCULAR | Status: DC | PRN
  Administered 2019-05-13 (×4): 50 ug via INTRAVENOUS

## 2019-05-13 MED ORDER — OXYCODONE HCL 5 MG PO TABS
ORAL_TABLET | ORAL | Status: AC
  Filled 2019-05-13: qty 1

## 2019-05-13 MED ORDER — OXYCODONE HCL 5 MG PO TABS: 5.0000 mg | ORAL_TABLET | ORAL | Status: DC | PRN

## 2019-05-13 MED ORDER — CEFAZOLIN SODIUM-DEXTROSE 2-4 GM/100ML-% IV SOLN
2.0000 g | Freq: Once | INTRAVENOUS | Status: AC
  Administered 2019-05-13: 14:00:00 2 g via INTRAVENOUS

## 2019-05-13 MED ORDER — MIDAZOLAM HCL 2 MG/2ML IJ SOLN
INTRAMUSCULAR | Status: DC | PRN
  Administered 2019-05-13: 2 mg via INTRAVENOUS

## 2019-05-13 MED ORDER — CEFAZOLIN SODIUM-DEXTROSE 2-4 GM/100ML-% IV SOLN
INTRAVENOUS | Status: AC
  Filled 2019-05-13: qty 100

## 2019-05-13 MED ORDER — FAMOTIDINE 20 MG PO TABS
20.0000 mg | ORAL_TABLET | Freq: Once | ORAL | Status: AC
  Administered 2019-05-13: 20 mg via ORAL

## 2019-05-13 MED ORDER — METOCLOPRAMIDE HCL 10 MG PO TABS: 5.0000 mg | ORAL_TABLET | Freq: Three times a day (TID) | ORAL | Status: DC | PRN

## 2019-05-13 MED ORDER — KETOROLAC TROMETHAMINE 30 MG/ML IJ SOLN
INTRAMUSCULAR | Status: AC
  Filled 2019-05-13: qty 1

## 2019-05-13 MED ORDER — SUGAMMADEX SODIUM 200 MG/2ML IV SOLN
INTRAVENOUS | Status: DC | PRN
  Administered 2019-05-13: 200 mg via INTRAVENOUS

## 2019-05-13 MED ORDER — LACTATED RINGERS IV SOLN
INTRAVENOUS | Status: DC
  Administered 2019-05-13: 13:00:00 via INTRAVENOUS

## 2019-05-13 MED ORDER — ONDANSETRON HCL 4 MG PO TABS: 4.0000 mg | ORAL_TABLET | Freq: Four times a day (QID) | ORAL | Status: DC | PRN

## 2019-05-13 MED ORDER — PROPOFOL 10 MG/ML IV BOLUS
INTRAVENOUS | Status: DC | PRN
  Administered 2019-05-13: 200 mg via INTRAVENOUS

## 2019-05-13 MED ORDER — NEOMYCIN-POLYMYXIN B GU 40-200000 IR SOLN
Status: AC
  Filled 2019-05-13: qty 2

## 2019-05-13 MED ORDER — ACETAMINOPHEN 325 MG PO TABS: 325.0000 mg | ORAL_TABLET | Freq: Four times a day (QID) | ORAL | Status: DC | PRN

## 2019-05-13 MED ORDER — DEXAMETHASONE SODIUM PHOSPHATE 10 MG/ML IJ SOLN
INTRAMUSCULAR | Status: AC
  Filled 2019-05-13: qty 1

## 2019-05-13 MED ORDER — ROCURONIUM BROMIDE 100 MG/10ML IV SOLN
INTRAVENOUS | Status: DC | PRN
  Administered 2019-05-13: 10 mg via INTRAVENOUS
  Administered 2019-05-13: 40 mg via INTRAVENOUS

## 2019-05-13 MED ORDER — MIDAZOLAM HCL 2 MG/2ML IJ SOLN
INTRAMUSCULAR | Status: AC
  Filled 2019-05-13: qty 2

## 2019-05-13 MED ORDER — FAMOTIDINE 20 MG PO TABS
ORAL_TABLET | ORAL | Status: AC
  Administered 2019-05-13: 13:00:00 20 mg via ORAL
  Filled 2019-05-13: qty 1

## 2019-05-13 MED ORDER — OXYCODONE HCL 5 MG PO TABS: 10.0000 mg | ORAL_TABLET | ORAL | Status: DC | PRN

## 2019-05-13 MED ORDER — SODIUM CHLORIDE 0.9 % IV SOLN: INTRAVENOUS | Status: DC

## 2019-05-13 MED ORDER — FENTANYL CITRATE (PF) 100 MCG/2ML IJ SOLN
INTRAMUSCULAR | Status: AC
  Filled 2019-05-13: qty 2

## 2019-05-13 MED ORDER — METOCLOPRAMIDE HCL 5 MG/ML IJ SOLN: 5.0000 mg | Freq: Three times a day (TID) | INTRAMUSCULAR | Status: DC | PRN

## 2019-05-13 MED ORDER — LIDOCAINE HCL (PF) 2 % IJ SOLN
INTRAMUSCULAR | Status: AC
  Filled 2019-05-13: qty 10

## 2019-05-13 MED ORDER — ACETAMINOPHEN 500 MG PO TABS: 1000.0000 mg | ORAL_TABLET | Freq: Four times a day (QID) | ORAL | Status: DC

## 2019-05-13 MED ORDER — HYDROMORPHONE HCL 1 MG/ML IJ SOLN: 0.5000 mg | INTRAMUSCULAR | Status: DC | PRN

## 2019-05-13 MED ORDER — HYDROMORPHONE HCL 1 MG/ML IJ SOLN
INTRAMUSCULAR | Status: AC
  Administered 2019-05-13: 0.25 mg via INTRAVENOUS
  Filled 2019-05-13: qty 1

## 2019-05-13 MED ORDER — NEOMYCIN-POLYMYXIN B GU 40-200000 IR SOLN
Status: DC | PRN
  Administered 2019-05-13: 4 mL

## 2019-05-13 MED ORDER — KETOROLAC TROMETHAMINE 30 MG/ML IJ SOLN
30.0000 mg | Freq: Once | INTRAMUSCULAR | Status: AC
  Administered 2019-05-13: 30 mg via INTRAVENOUS

## 2019-05-13 MED ORDER — ONDANSETRON HCL 4 MG/2ML IJ SOLN
INTRAMUSCULAR | Status: AC
  Filled 2019-05-13: qty 2

## 2019-05-13 MED ORDER — LIDOCAINE HCL (CARDIAC) PF 100 MG/5ML IV SOSY
PREFILLED_SYRINGE | INTRAVENOUS | Status: DC | PRN
  Administered 2019-05-13: 100 mg via INTRAVENOUS

## 2019-05-13 MED ORDER — FENTANYL CITRATE (PF) 100 MCG/2ML IJ SOLN
25.0000 ug | INTRAMUSCULAR | Status: DC | PRN
  Administered 2019-05-13 (×2): 50 ug via INTRAVENOUS

## 2019-05-13 MED ORDER — BUPIVACAINE HCL (PF) 0.5 % IJ SOLN
INTRAMUSCULAR | Status: DC | PRN
  Administered 2019-05-13: 10 mL

## 2019-05-13 MED ORDER — FENTANYL CITRATE (PF) 100 MCG/2ML IJ SOLN
INTRAMUSCULAR | Status: AC
  Administered 2019-05-13: 15:00:00 50 ug via INTRAVENOUS
  Filled 2019-05-13: qty 2

## 2019-05-13 MED ORDER — OXYCODONE-ACETAMINOPHEN 7.5-325 MG PO TABS: 1.0000 | ORAL_TABLET | Freq: Four times a day (QID) | ORAL | 0 refills | Status: DC | PRN

## 2019-05-13 MED ORDER — OXYCODONE HCL 5 MG/5ML PO SOLN: 5.0000 mg | Freq: Once | ORAL | Status: AC | PRN

## 2019-05-13 MED ORDER — SUCCINYLCHOLINE CHLORIDE 20 MG/ML IJ SOLN
INTRAMUSCULAR | Status: AC
  Filled 2019-05-13: qty 1

## 2019-05-13 MED ORDER — HYDROMORPHONE HCL 1 MG/ML IJ SOLN
0.2500 mg | INTRAMUSCULAR | Status: DC | PRN
  Administered 2019-05-13 (×2): 0.25 mg via INTRAVENOUS

## 2019-05-13 MED ORDER — DEXAMETHASONE SODIUM PHOSPHATE 10 MG/ML IJ SOLN
INTRAMUSCULAR | Status: DC | PRN
  Administered 2019-05-13: 10 mg via INTRAVENOUS

## 2019-05-13 MED ORDER — BUPIVACAINE HCL (PF) 0.5 % IJ SOLN
INTRAMUSCULAR | Status: AC
  Filled 2019-05-13: qty 30

## 2019-05-13 SURGICAL SUPPLY — 42 items
APL PRP STRL LF DISP 70% ISPRP (MISCELLANEOUS) ×1
CANISTER SUCT 1200ML W/VALVE (MISCELLANEOUS) ×3 IMPLANT
CHLORAPREP W/TINT 26 (MISCELLANEOUS) ×3 IMPLANT
COVER WAND RF STERILE (DRAPES) ×3 IMPLANT
CUFF TOURN SGL QUICK 24 (TOURNIQUET CUFF)
CUFF TOURN SGL QUICK 30 (TOURNIQUET CUFF)
CUFF TOURN SGL QUICK 34 (TOURNIQUET CUFF) ×3
CUFF TRNQT CYL 24X4X16.5-23 (TOURNIQUET CUFF) IMPLANT
CUFF TRNQT CYL 30X4X21-28X (TOURNIQUET CUFF) IMPLANT
CUFF TRNQT CYL 34X4.125X (TOURNIQUET CUFF) IMPLANT
DRAPE C-ARM XRAY 36X54 (DRAPES) ×3 IMPLANT
DRAPE INCISE IOBAN 66X45 STRL (DRAPES) ×3 IMPLANT
DRSG EMULSION OIL 3X8 NADH (GAUZE/BANDAGES/DRESSINGS) ×3 IMPLANT
ELECT CAUTERY BLADE 6.4 (BLADE) ×3 IMPLANT
ELECT REM PT RETURN 9FT ADLT (ELECTROSURGICAL) ×3
ELECTRODE REM PT RTRN 9FT ADLT (ELECTROSURGICAL) ×1 IMPLANT
GAUZE SPONGE 4X4 12PLY STRL (GAUZE/BANDAGES/DRESSINGS) ×3 IMPLANT
GAUZE XEROFORM 1X8 LF (GAUZE/BANDAGES/DRESSINGS) ×3 IMPLANT
GLOVE SURG SYN 9.0  PF PI (GLOVE) ×2
GLOVE SURG SYN 9.0 PF PI (GLOVE) ×1 IMPLANT
GOWN SRG 2XL LVL 4 RGLN SLV (GOWNS) ×1 IMPLANT
GOWN STRL NON-REIN 2XL LVL4 (GOWNS) ×3
GOWN STRL REUS W/ TWL LRG LVL3 (GOWN DISPOSABLE) ×1 IMPLANT
GOWN STRL REUS W/TWL LRG LVL3 (GOWN DISPOSABLE) ×3
KIT TURNOVER KIT A (KITS) ×3 IMPLANT
NDL FILTER BLUNT 18X1 1/2 (NEEDLE) ×1 IMPLANT
NEEDLE FILTER BLUNT 18X 1/2SAF (NEEDLE) ×2
NEEDLE FILTER BLUNT 18X1 1/2 (NEEDLE) ×1 IMPLANT
NS IRRIG 1000ML POUR BTL (IV SOLUTION) ×3 IMPLANT
PACK EXTREMITY ARMC (MISCELLANEOUS) ×3 IMPLANT
PAD ABD DERMACEA PRESS 5X9 (GAUZE/BANDAGES/DRESSINGS) ×6 IMPLANT
SCALPEL PROTECTED #15 DISP (BLADE) ×6 IMPLANT
STAPLER SKIN PROX 35W (STAPLE) ×3 IMPLANT
SUT ETHIBOND NAB CT1 #1 30IN (SUTURE) ×3 IMPLANT
SUT ETHILON 3-0 FS-10 30 BLK (SUTURE) ×3
SUT MNCRL AB 4-0 PS2 18 (SUTURE) ×2 IMPLANT
SUT VIC AB 0 CT1 36 (SUTURE) ×3 IMPLANT
SUT VIC AB 2-0 CT1 27 (SUTURE) ×3
SUT VIC AB 2-0 CT1 TAPERPNT 27 (SUTURE) ×1 IMPLANT
SUTURE EHLN 3-0 FS-10 30 BLK (SUTURE) ×1 IMPLANT
SYR 10ML LL (SYRINGE) ×3 IMPLANT
WATER STERILE IRR 1000ML POUR (IV SOLUTION) ×3 IMPLANT

## 2019-05-13 NOTE — Anesthesia Procedure Notes (Signed)
Procedure Name: Intubation Date/Time: 05/13/2019 1:42 PM Performed by: Eben Burow, CRNA Pre-anesthesia Checklist: Emergency Drugs available, Suction available, Patient identified and Patient being monitored Patient Re-evaluated:Patient Re-evaluated prior to induction Oxygen Delivery Method: Circle system utilized Preoxygenation: Pre-oxygenation with 100% oxygen Induction Type: IV induction Ventilation: Mask ventilation without difficulty Laryngoscope Size: McGraph and 3 Grade View: Grade I Tube type: Oral Tube size: 7.5 mm Number of attempts: 1 Airway Equipment and Method: Stylet and Video-laryngoscopy Placement Confirmation: positive ETCO2 and breath sounds checked- equal and bilateral Secured at: 23 cm Tube secured with: Tape Dental Injury: Teeth and Oropharynx as per pre-operative assessment

## 2019-05-13 NOTE — Anesthesia Preprocedure Evaluation (Signed)
Anesthesia Evaluation  Patient identified by MRN, date of birth, ID band Patient awake    Reviewed: Allergy & Precautions, NPO status , Patient's Chart, lab work & pertinent test results  History of Anesthesia Complications (+) history of anesthetic complications  Airway Mallampati: IV  TM Distance: >3 FB Neck ROM: full    Dental  (+) Chipped   Pulmonary Current Smoker,    Pulmonary exam normal        Cardiovascular hypertension, Normal cardiovascular exam     Neuro/Psych  Headaches, Seizures -, Poorly Controlled,  negative psych ROS   GI/Hepatic Neg liver ROS, GERD  Medicated and Controlled,  Endo/Other  negative endocrine ROS  Renal/GU negative Renal ROS  negative genitourinary   Musculoskeletal  (+) Arthritis ,   Abdominal Normal abdominal exam  (+)   Peds negative pediatric ROS (+)  Hematology negative hematology ROS (+)   Anesthesia Other Findings Past Medical History: No date: Hypertension No date: Seizures ([place])  Past Surgical History: No date: APPENDECTOMY No date: CHOLECYSTECTOMY 11/10/2018: ORIF ANKLE FRACTURE; Right     Comment:  Procedure: OPEN REDUCTION INTERNAL FIXATION (ORIF) ANKLE              FRACTURE;  Surgeon: Hessie Knows, MD;  Location: [facility]               ORS;  Service: Orthopedics;  Laterality: Right; No date: TUBAL LIGATION  BMI    Body Mass Index: 42.91 kg/m      Reproductive/Obstetrics                             Anesthesia Physical  Anesthesia Plan  ASA: III  Anesthesia Plan: General   Post-op Pain Management:    Induction: Intravenous  PONV Risk Score and Plan:   Airway Management Planned: Oral ETT  Additional Equipment:   Intra-op Plan:   Post-operative Plan: Extubation in OR  Informed Consent: I have reviewed the patients History and Physical, chart, labs and discussed the procedure including the risks, benefits and alternatives  for the proposed anesthesia with the patient or authorized representative who has indicated his/her understanding and acceptance.     Dental advisory given  Plan Discussed with: CRNA and Surgeon  Anesthesia Plan Comments: (Patient consented for risks of anesthesia including but not limited to:  - adverse reactions to medications - damage to teeth, lips or other oral mucosa - sore throat or hoarseness - Damage to heart, brain, lungs or loss of life  Patient voiced understanding.)        Anesthesia Quick Evaluation

## 2019-05-13 NOTE — Discharge Instructions (Addendum)
Keep leg elevated is much as possible.  Okay to put ice on the back of the ankle to help with pain.  Pain medicine as directed.  Weightbearing as tolerated.  Loosen Ace wrap if pain is severe.  AMBULATORY SURGERY  DISCHARGE INSTRUCTIONS   1) The drugs that you were given will stay in your system until tomorrow so for the next 24 hours you should not:  A) Drive an automobile B) Make any legal decisions C) Drink any alcoholic beverage   2) You may resume regular meals tomorrow.  Today it is better to start with liquids and gradually work up to solid foods.  You may eat anything you prefer, but it is better to start with liquids, then soup and crackers, and gradually work up to solid foods.   3) Please notify your doctor immediately if you have any unusual bleeding, trouble breathing, redness and pain at the surgery site, drainage, fever, or pain not relieved by medication.    4) Additional Instructions:        Please contact your physician with any problems or Same Day Surgery at [phone], Monday through Friday 6 am to 4 pm, or [place] at [facility] number at [phone].

## 2019-05-13 NOTE — Anesthesia Post-op Follow-up Note (Signed)
Anesthesia QCDR form completed.        

## 2019-05-13 NOTE — Progress Notes (Signed)
Pt stated "I don't feel right" but could not describe how she felt.  Pt started moving both arms, eyes closed and would not respond.  Event lasted about one minute.  VSS, pt safety maintained, Dr Kayleen Memos called and at bedside.  Pt began to respond to questions.  No postictal period.  Pt resting in bed with eyes closed.  AOx4.  Will continue to monitor.

## 2019-05-13 NOTE — Transfer of Care (Signed)
Immediate Anesthesia Transfer of Care Note  Patient: Barbara Petersen  Procedure(s) Performed: HARDWARE REMOVAL POSTERIOR RIGHT ANKLE (Right )  Patient Location: PACU  Anesthesia Type:General  Level of Consciousness: awake, alert  and oriented  Airway & Oxygen Therapy: Patient Spontanous Breathing and Patient connected to face mask oxygen  Post-op Assessment: Report given to RN and Post -op Vital signs reviewed and stable  Post vital signs: Reviewed and stable  Last Vitals:  Vitals Value Taken Time  BP 154/87 05/13/19 1504  Temp 35.6 C 05/13/19 1504  Pulse 72 05/13/19 1505  Resp 22 05/13/19 1505  SpO2 100 % 05/13/19 1505  Vitals shown include unvalidated device data.  Last Pain:  Vitals:   05/13/19 1504  TempSrc: Temporal  PainSc: 0-No pain      Patients Stated Pain Goal: 0 (82/64/15 8309)  Complications: No apparent anesthesia complications

## 2019-05-13 NOTE — H&P (Signed)
Reviewed paper H+P, will be scanned into chart. No changes noted.  

## 2019-05-13 NOTE — Op Note (Signed)
05/13/2019  3:00 PM  PATIENT:  Barbara Petersen  48 y.o. female  PRE-OPERATIVE DIAGNOSIS: Painful posterior hardware RIGHT ANKLE  POST-OPERATIVE DIAGNOSIS: Painful posterior hardware RIGHT ANKLE  PROCEDURE:  Procedure(s): HARDWARE REMOVAL POSTERIOR RIGHT ANKLE (Right)  SURGEON: Laurene Footman, MD  ASSISTANTS: None  ANESTHESIA:   general  EBL:  No intake/output data recorded.  BLOOD ADMINISTERED:none  DRAINS: none   LOCAL MEDICATIONS USED:  MARCAINE     SPECIMEN:  No Specimen  DISPOSITION OF SPECIMEN:  N/A  COUNTS:  YES  TOURNIQUET: 26 minutes at 300 mmHG  IMPLANTS: None  DICTATION: .Dragon Dictation patient was brought to the operating room and after adequate general anesthesia was obtained the patient was placed prone.  The right leg was prepped and draped in the usual sterile manner with a tourniquet previously been applied to the upper thigh.  After patient identification and timeout procedures were completed tourniquet was raised.  Going to the previous incision was inset skin incision was made and the muscle of the peroneals was identified along the tendons this was retracted laterally with the FHL retracted medially to provide exposure to the posterior lateral ankle elevator was then used to elevate the posterior muscle of the FHL off the posterior tibia and expose the prior placed plate all 3 screws removed without difficulty followed by the plate.  Next the peroneals were shifted more laterally and the posterior plate on the fibula was exposed without any difficulty and all screws removed along with the plate.  The wound was thoroughly irrigated and then infiltrated with 10 cc of half percent Sensorcaine without epinephrine for postop analgesia.  Tourniquet was let down and there was no significant bleeding.  Skin was closed with 2-0 Vicryl for the deep subcutaneous with a 30V lock subcutaneous and a 4-0 Monocryl subcuticular followed by Dermabond to try to prevent keloid  formation.  Telfa 4 x 4's web roll and Ace wrap were applied after the Dermabond was set.  PLAN OF CARE: Discharge to home after PACU  PATIENT DISPOSITION:  PACU - hemodynamically stable.

## 2019-05-14 ENCOUNTER — Encounter: Payer: Self-pay | Admitting: Orthopedic Surgery

## 2019-05-14 NOTE — Anesthesia Postprocedure Evaluation (Signed)
Anesthesia Post Note  Patient: Barbara Petersen  Procedure(s) Performed: HARDWARE REMOVAL POSTERIOR RIGHT ANKLE (Right )  Patient location during evaluation: PACU Anesthesia Type: General Level of consciousness: awake and alert Pain management: pain level controlled Vital Signs Assessment: post-procedure vital signs reviewed and stable Respiratory status: spontaneous breathing, nonlabored ventilation, respiratory function stable and patient connected to nasal cannula oxygen Cardiovascular status: blood pressure returned to baseline and stable Postop Assessment: no apparent nausea or vomiting Anesthetic complications: no     Last Vitals:  Vitals:   05/13/19 1642 05/13/19 1740  BP: (!) 153/86 (!) 156/80  Pulse: 67 80  Resp: 18 18  Temp: (!) 36.3 C 36.7 C  SpO2: 95% 100%    Last Pain:  Vitals:   05/13/19 1740  TempSrc:   PainSc: 2                  Precious Haws Damyah Gugel

## 2019-05-26 DIAGNOSIS — Z6841 Body Mass Index (BMI) 40.0 and over, adult: Secondary | ICD-10-CM | POA: Insufficient documentation

## 2019-06-08 IMAGING — MR MR HEAD W/O CM
10 series · 48 of 48 positions shown · non-contrast
Comparison: CT studies 04/27/2018 and today.

CLINICAL DATA: Acute aphasia

EXAM:
MRI HEAD WITHOUT CONTRAST
TECHNIQUE: Multiplanar, multiecho pulse sequences of the brain and surrounding
structures were obtained without intravenous contrast.

[Series 2: T1 · sagittal · 5.0mm · 0.45mm/px · 3 of 23 slices shown (1 of 2)]
[im 1/23]
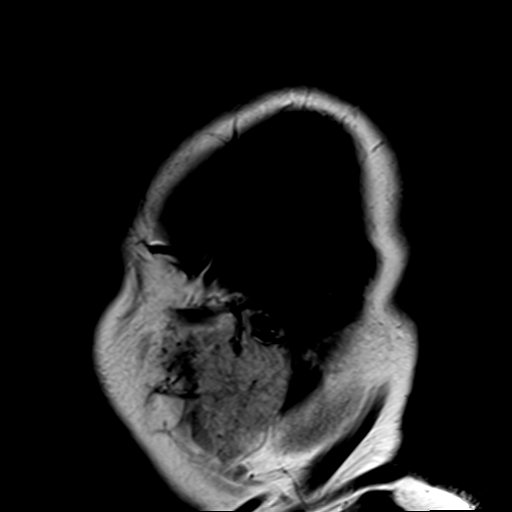
[im 12/23]
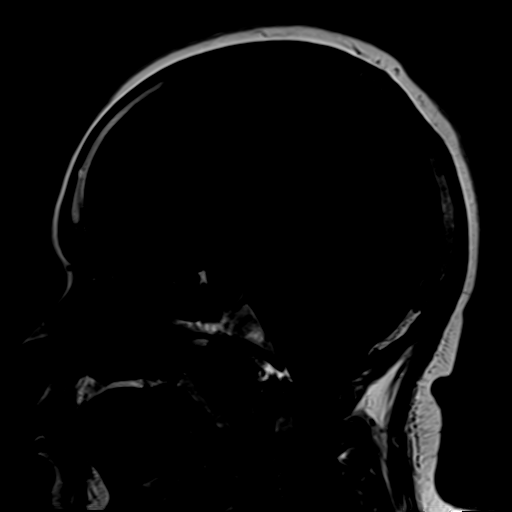
[im 23/23]
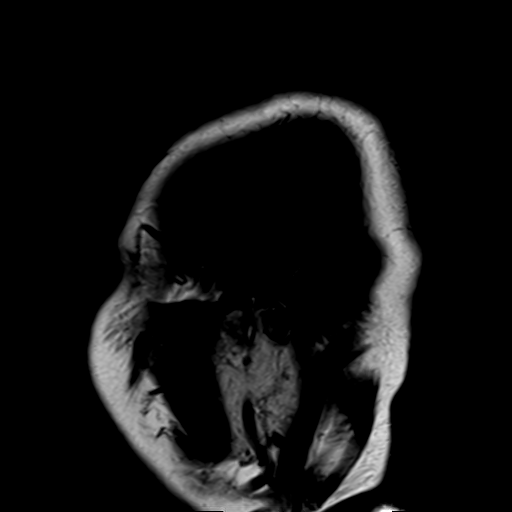

[Series 4: DWI · axial · 3.0mm · 1.80mm/px · z∈[-87,+73]mm · 5 of 55 slices shown (1 of 4)]
[im 1/55]
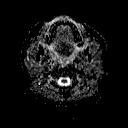
[im 14/55]
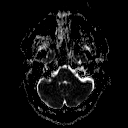
[im 28/55]
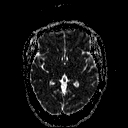
[im 41/55]
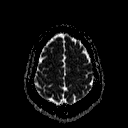
[im 55/55]
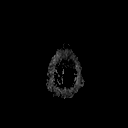

[Series 6: DWI · coronal · 3.0mm · 1.80mm/px · 4 of 45 slices shown (2 of 4)]
[im 1/45]
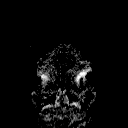
[im 15/45]
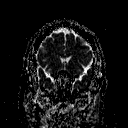
[im 30/45]
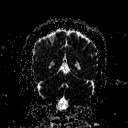
[im 45/45]
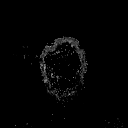

[Series 7: T2 · axial · 5.0mm · 0.60mm/px · z∈[-87,+68]mm · 2 of 25 slices shown (1 of 3)]
[im 1/25]
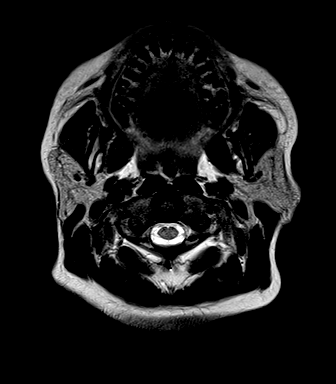
[im 25/25]
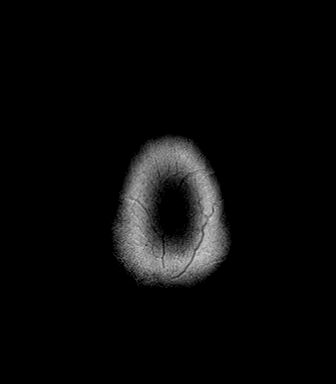

[Series 8: FLAIR · axial · 3.0mm · 0.45mm/px · z∈[-87,+68]mm · 5 of 53 slices shown]
[im 1/53]
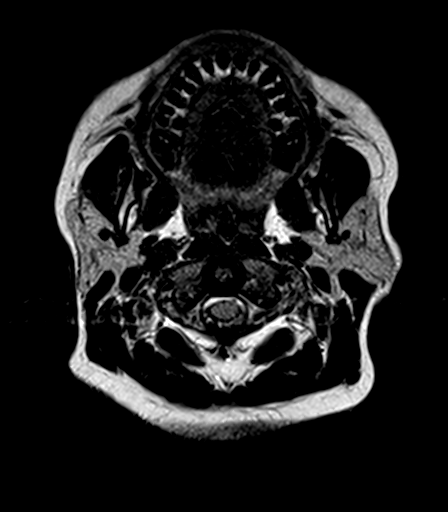
[im 14/53]
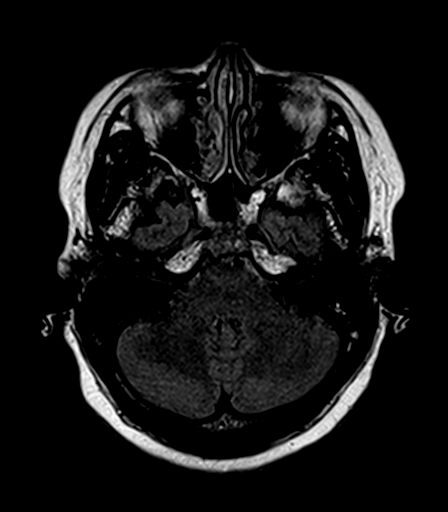
[im 27/53]
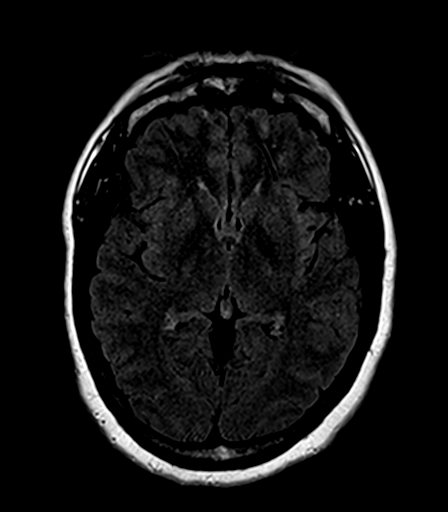
[im 40/53]
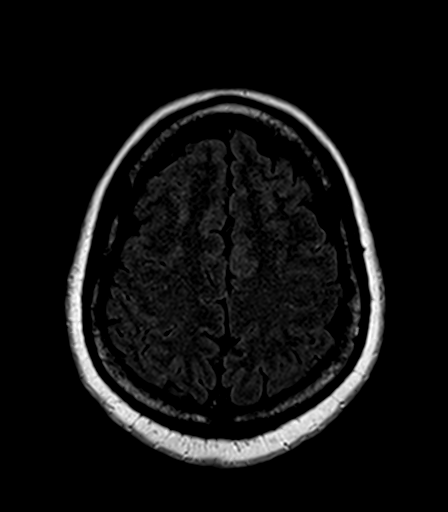
[im 53/53]
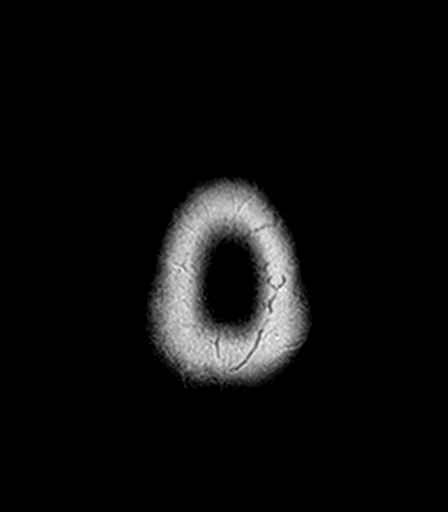

[Series 9: T2 · axial · 5.0mm · 0.45mm/px · z∈[-87,+68]mm · 2 of 25 slices shown (2 of 3)]
[im 1/25]
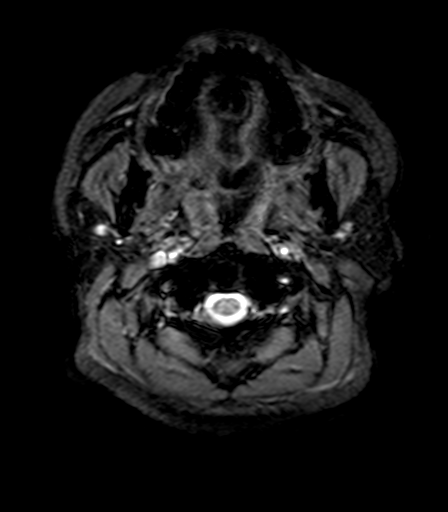
[im 25/25]
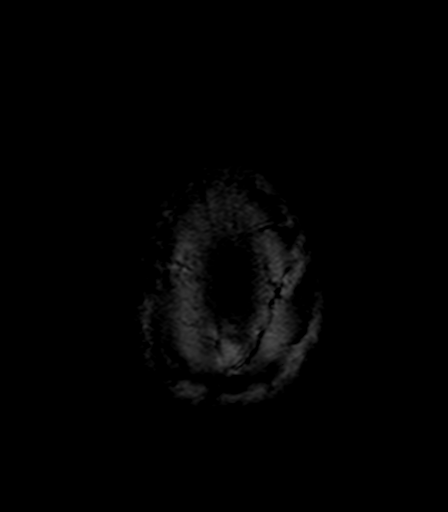

[Series 10: T1 · axial · 1.0mm · 1.00mm/px · z∈[-98,+75]mm · 16 of 176 slices shown (2 of 2)]
[im 1/176]
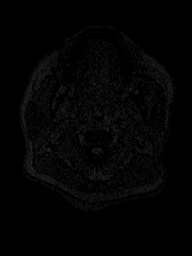
[im 12/176]
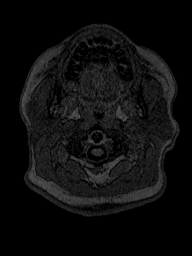
[im 24/176]
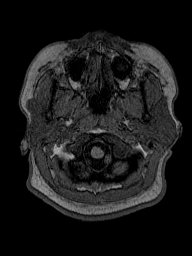
[im 36/176]
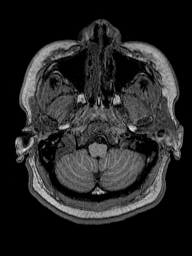
[im 47/176]
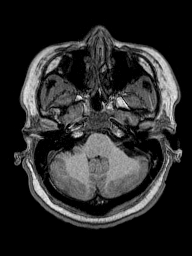
[im 59/176]
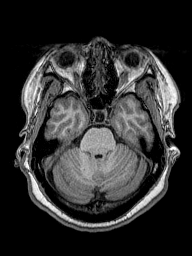
[im 71/176]
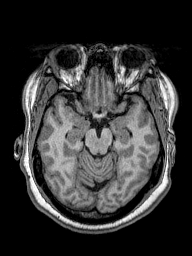
[im 82/176]
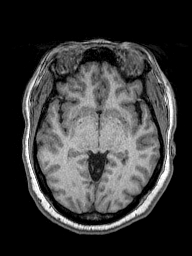
[im 94/176]
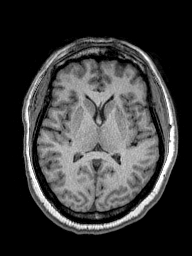
[im 106/176]
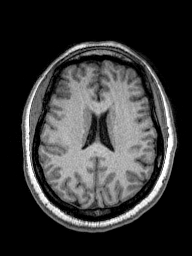
[im 117/176]
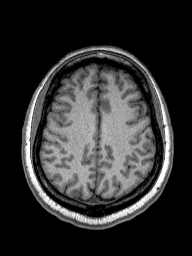
[im 129/176]
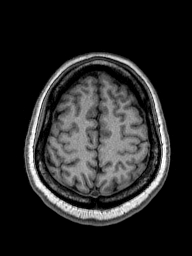
[im 141/176]
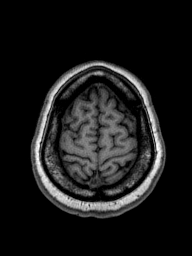
[im 152/176]
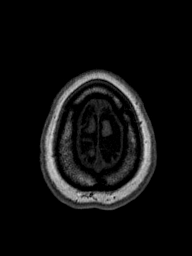
[im 164/176]
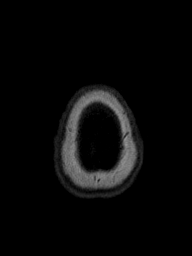
[im 176/176]
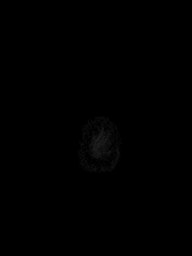

[Series 11: T2 · coronal · 5.0mm · 0.49mm/px · 2 of 27 slices shown (3 of 3)]
[im 1/27]
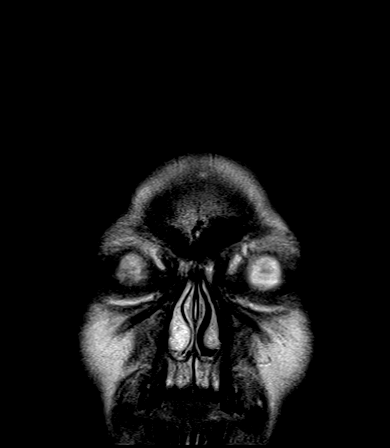
[im 27/27]
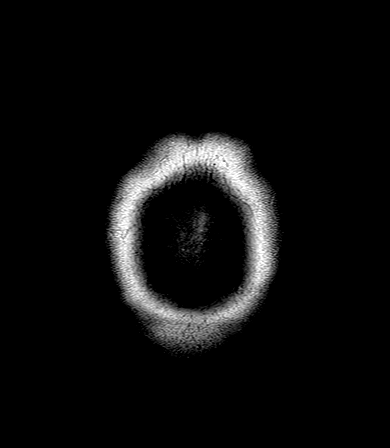

[Series 100: DWI · axial · 3.0mm · 1.80mm/px · z∈[-87,+73]mm · 5 of 55 slices shown (3 of 4)]
[im 1/55]
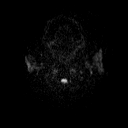
[im 14/55]
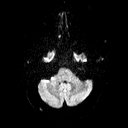
[im 28/55]
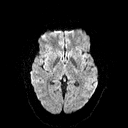
[im 41/55]
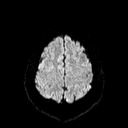
[im 55/55]
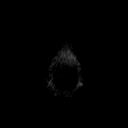

[Series 101: DWI · coronal · 3.0mm · 1.80mm/px · 4 of 45 slices shown (4 of 4)]
[im 1/45]
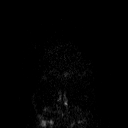
[im 15/45]
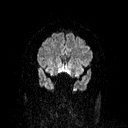
[im 30/45]
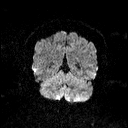
[im 45/45]
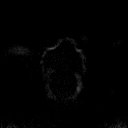

[48 of 48 positions shown; findings below may reference images not displayed]

FINDINGS: Brain: Brain has normal appearance without evidence of malformation,
atrophy, old or acute small or large vessel infarction, mass lesion,
hemorrhage, hydrocephalus or extra-axial collection.

Vascular: Major vessels at the base of the brain show flow. Venous
sinuses appear patent.

Skull and upper cervical spine: Normal.

Sinuses/Orbits: Clear/normal.

Other: None significant.
IMPRESSION: Normal examination. No abnormality seen to explain the clinical
presentation.

## 2019-11-20 ENCOUNTER — Emergency Department
Admission: EM | Admit: 2019-11-20 | Discharge: 2019-11-20 | Disposition: A | Discharge status: Home / Self Care | Payer: Self-pay | Attending: Emergency Medicine | Admitting: Emergency Medicine

## 2019-11-20 ENCOUNTER — Other Ambulatory Visit: Payer: Self-pay

## 2019-11-20 ENCOUNTER — Encounter: Payer: Self-pay | Admitting: Emergency Medicine

## 2019-11-20 DIAGNOSIS — B349 Viral infection, unspecified: Secondary | ICD-10-CM | POA: Insufficient documentation

## 2019-11-20 DIAGNOSIS — Z79899 Other long term (current) drug therapy: Secondary | ICD-10-CM | POA: Insufficient documentation

## 2019-11-20 DIAGNOSIS — F17210 Nicotine dependence, cigarettes, uncomplicated: Secondary | ICD-10-CM | POA: Insufficient documentation

## 2019-11-20 DIAGNOSIS — Z20822 Contact with and (suspected) exposure to covid-19: Secondary | ICD-10-CM

## 2019-11-20 DIAGNOSIS — Z20828 Contact with and (suspected) exposure to other viral communicable diseases: Secondary | ICD-10-CM | POA: Insufficient documentation

## 2019-11-20 DIAGNOSIS — R030 Elevated blood-pressure reading, without diagnosis of hypertension: Secondary | ICD-10-CM

## 2019-11-20 DIAGNOSIS — I10 Essential (primary) hypertension: Secondary | ICD-10-CM | POA: Insufficient documentation

## 2019-11-20 LAB — COMPREHENSIVE METABOLIC PANEL
ALT: 20 U/L (ref 0–44)
AST: 17 U/L (ref 15–41)
Albumin: 4.2 g/dL (ref 3.5–5.0)
Alkaline Phosphatase: 57 U/L (ref 38–126)
Anion gap: 12 (ref 5–15)
BUN: 15 mg/dL (ref 6–20)
CO2: 26 mmol/L (ref 22–32)
Calcium: 9.3 mg/dL (ref 8.9–10.3)
Chloride: 104 mmol/L (ref 98–111)
Creatinine, Ser: 0.79 mg/dL (ref 0.44–1.00)
GFR calc Af Amer: >60 mL/min (ref >60)
GFR calc non Af Amer: >60 mL/min (ref >60)
Glucose, Bld: 95 mg/dL (ref 70–99)
Potassium: 4 mmol/L (ref 3.5–5.1)
Sodium: 142 mmol/L (ref 135–145)
Total Bilirubin: 0.6 mg/dL (ref 0.3–1.2)
Total Protein: 8.3 g/dL — ABNORMAL HIGH (ref 6.5–8.1)

## 2019-11-20 LAB — URINALYSIS, COMPLETE (UACMP) WITH MICROSCOPIC
Bilirubin Urine: NEGATIVE
Glucose, UA: NEGATIVE mg/dL
Hgb urine dipstick: NEGATIVE
Ketones, ur: NEGATIVE mg/dL
Nitrite: NEGATIVE
Protein, ur: NEGATIVE mg/dL
Specific Gravity, Urine: 1.027 (ref 1.005–1.030)
pH: 5 (ref 5.0–8.0)

## 2019-11-20 LAB — CBC
HCT: 38.5 % (ref 36.0–46.0)
Hemoglobin: 12.7 g/dL (ref 12.0–15.0)
MCH: 28.1 pg (ref 26.0–34.0)
MCHC: 33 g/dL (ref 30.0–36.0)
MCV: 85.2 fL (ref 80.0–100.0)
Platelets: 295 10*3/uL (ref 150–400)
RBC: 4.52 MIL/uL (ref 3.87–5.11)
RDW: 14.9 % (ref 11.5–15.5)
WBC: 7.2 10*3/uL (ref 4.0–10.5)
nRBC: 0 % (ref 0.0–0.2)

## 2019-11-20 LAB — POC URINE PREG, ED: Preg Test, Ur: NEGATIVE

## 2019-11-20 LAB — LIPASE, BLOOD: Lipase: 24 U/L (ref 11–51)

## 2019-11-20 MED ORDER — BENZONATATE 100 MG PO CAPS: 100.0000 mg | ORAL_CAPSULE | Freq: Three times a day (TID) | ORAL | 0 refills | Status: DC | PRN

## 2019-11-20 NOTE — ED Provider Notes (Signed)
[facility] Emergency Department Provider Note  ____________________________________________  Time seen: Approximately 4:04 PM  I have reviewed the triage vital signs and the nursing notes.   HISTORY  Chief Complaint COVID sxs    HPI Barbara Petersen is a 49 y.o. female that presents to the emergency department for evaluation of headache, cough, itchy throat, vomiting, diarrhea.  Patient states symptoms started out as a headache 4 days ago.  The following day she developed a productive cough with clear phlegm.  Two days, she did have some shortness of breath but shortness of breath resolved the same day.  She also had some chest pain, but only with her cough.  Her cough has slowly started to improve.  Yesterday she had an episode of vomiting.  Last night she started to have diarrhea.  She has had 4 episodes of diarrhea today.  No blood, mucus, green in her diarrhea.  She has had some low abdominal soreness.  It feels like she has a cold.  No known sick contacts.  No known fevers.  She came to the emergency department to ensure that she does not have Covid.  No current shortness of breath or chest pain.  Patient also states that she ran out of her blood pressure medication about 2 years ago and never had a primary care provider to refill it.  Patient was on lisinopril.  She noticed that her blood pressure has been higher than usual a couple of times recently.  Her blood pressure is sometimes normal and sometimes runs in the 160s/170s over 70s. Patient states that when she checked it the other day, her systolic was over 200.  She is not sure if it is because she has a cold. She took a friend's blood pressure medication because she was concerned that her blood pressure was high.  She has an appointment to establish with primary care on the 13th.   Past Medical History:  Diagnosis Date  . Arthritis    right ankle  . Complication of anesthesia 2013   seizure during  appendectomy  . GERD (gastroesophageal reflux disease)    occ no meds  . Headache    migraines  . History of methicillin resistant staphylococcus aureus (MRSA) 2010  . Hypertension    been off bp meds due to no insurance 5-6 months  . Seizures (HCC)    last seizure was in May 2020    Patient Active Problem List   Diagnosis Date Noted  . Aphasia 04/28/2018    Past Surgical History:  Procedure Laterality Date  . APPENDECTOMY    . CHOLECYSTECTOMY    . HARDWARE REMOVAL Right 05/13/2019   Procedure: HARDWARE REMOVAL POSTERIOR RIGHT ANKLE;  Surgeon: Kennedy Bucker, MD;  Location: [facility] ORS;  Service: Orthopedics;  Laterality: Right;  . ORIF ANKLE FRACTURE Right 11/10/2018   Procedure: OPEN REDUCTION INTERNAL FIXATION (ORIF) ANKLE FRACTURE;  Surgeon: Kennedy Bucker, MD;  Location: [facility] ORS;  Service: Orthopedics;  Laterality: Right;  . TUBAL LIGATION      Prior to Admission medications   Medication Sig Start Date End Date Taking? Authorizing Provider  aspirin EC 81 MG EC tablet Take 1 tablet (81 mg total) by mouth daily. Patient not taking: Reported on 04/28/2019 04/29/18   Enedina Finner, MD  benzonatate (TESSALON PERLES) 100 MG capsule Take 1 capsule (100 mg total) by mouth 3 (three) times daily as needed. 11/20/19 11/19/20  Enid Derry, PA-C  levETIRAcetam (KEPPRA) 500 MG tablet Take 500 mg by  mouth 2 (two) times daily.    [provider]  oxyCODONE-acetaminophen (PERCOCET) 7.5-325 MG tablet Take 1 tablet by mouth every 6 (six) hours as needed for severe pain. 05/13/19   Kennedy Bucker, MD    Allergies Patient has no known allergies.  No family history on file.  Social History Social History   Tobacco Use  . Smoking status: Current Every Day Smoker    Packs/day: 0.25    Years: 15.00    Pack years: 3.75    Types: Cigarettes  . Smokeless tobacco: Never Used  Substance Use Topics  . Alcohol use: Yes    Comment: monthly  . Drug use: No     Review of Systems   Constitutional: No fever/chills Eyes: No visual changes. No discharge. ENT: Negative for congestion and rhinorrhea. Positive for itchy throat. Cardiovascular: No chest pain. Respiratory: Positive for cough. No recent SOB. Gastrointestinal: No vomiting today. Diarrhea today. Musculoskeletal: Negative for musculoskeletal pain. Skin: Negative for rash, abrasions, lacerations, ecchymosis. Neurological: Positive for recent headache.   ____________________________________________   PHYSICAL EXAM:  VITAL SIGNS: ED Triage Vitals  Enc Vitals Group     BP 11/20/19 1435 (!) 162/102     Pulse Rate 11/20/19 1435 83     Resp 11/20/19 1435 16     Temp 11/20/19 1435 99.3 F (37.4 C)     Temp Source 11/20/19 1435 Oral     SpO2 11/20/19 1435 100 %     Weight 11/20/19 1433 250 lb (113.4 kg)     Height 11/20/19 1433 5\' 4"  (1.626 m)     Head Circumference --      Peak Flow --      Pain Score 11/20/19 1433 0     Pain Loc --      Pain Edu? --      Excl. in GC? --      Constitutional: Alert and oriented. Well appearing and in no acute distress. Eyes: Conjunctivae are normal. PERRL. EOMI. No discharge. Head: Atraumatic. ENT: No frontal and maxillary sinus tenderness.      Ears: Tympanic membranes pearly gray with good landmarks. No discharge.      Nose: No congestion/rhinnorhea.      Mouth/Throat: Mucous membranes are moist. Oropharynx non-erythematous. Tonsils not enlarged. No exudates. Uvula midline. Neck: No stridor.   Hematological/Lymphatic/Immunilogical: No cervical lymphadenopathy. Cardiovascular: Normal rate, regular rhythm.  Good peripheral circulation. Respiratory: Normal respiratory effort without tachypnea or retractions. Lungs CTAB. Good air entry to the bases with no decreased or absent breath sounds. Gastrointestinal: Bowel sounds 4 quadrants. Soft and nontender to palpation. No guarding or rigidity. No palpable masses. No distention. Musculoskeletal: Full range of motion to  all extremities. No gross deformities appreciated. Neurologic:  Normal speech and language. No gross focal neurologic deficits are appreciated.  Skin:  Skin is warm, dry and intact. No rash noted. Psychiatric: Mood and affect are normal. Speech and behavior are normal. Patient exhibits appropriate insight and judgement.   ____________________________________________   LABS (all labs ordered are listed, but only abnormal results are displayed)  Labs Reviewed  COMPREHENSIVE METABOLIC PANEL - Abnormal; Notable for the following components:      Result Value   Total Protein 8.3 (*)    All other components within normal limits  URINALYSIS, COMPLETE (UACMP) WITH MICROSCOPIC - Abnormal; Notable for the following components:   Color, Urine YELLOW (*)    APPearance CLOUDY (*)    Leukocytes,Ua TRACE (*)    Bacteria, UA RARE (*)  All other components within normal limits  SARS CORONAVIRUS 2 (TAT 6-24 HRS)  CBC  LIPASE, BLOOD  POC URINE PREG, ED   ____________________________________________  EKG   ____________________________________________  RADIOLOGY  No results found.  ____________________________________________    PROCEDURES  Procedure(s) performed:    Procedures    Medications - No data to display   ____________________________________________   INITIAL IMPRESSION / ASSESSMENT AND PLAN / ED COURSE  Pertinent labs & imaging results that were available during my care of the patient were reviewed by me and considered in my medical decision making (see chart for details).  Review of the  [place] was performed in accordance of the [place] prior to dispensing any controlled drugs.   Patient's diagnosis is consistent with viral illness. Vital signs and exam are reassuring.  Lab work largely unremarkable.  Covid test is pending.  Blood pressure mildly currently elevated, which may be due to patient feeling unwell.  Blood pressure mildly elevated at 162/85.  She states  that her blood pressure fluctuates between normal and this high.  Patient has had previous office visits in this range but has also had visits with her blood pressure and a normal range so we will not begin any blood pressure medications at this time.  Patient will continue to monitor her blood pressure.  No current headache, shortness of breath, chest pain.  Patient is agreeable with this plan.  She will return to the emergency department for any worsening or concerning symptoms.  She will follow up with primary care on the 13th.  Patient will be discharged home with prescriptions for [facility]. Patient is to follow up with primary care as needed or otherwise directed. Patient is given ED precautions to return to the ED for any worsening or new symptoms.  Elida Harbin was evaluated in Emergency Department on 11/20/2019 for the symptoms described in the history of present illness. She was evaluated in the context of the global COVID-19 pandemic, which necessitated consideration that the patient might be at risk for infection with the SARS-CoV-2 virus that causes COVID-19. Institutional protocols and algorithms that pertain to the evaluation of patients at risk for COVID-19 are in a state of rapid change based on information released by regulatory bodies including the CDC and federal and state organizations. These policies and algorithms were followed during the patient's care in the ED.   ____________________________________________  FINAL CLINICAL IMPRESSION(S) / ED DIAGNOSES  Final diagnoses:  Viral illness  Encounter for screening laboratory testing for COVID-19 virus  Elevated blood pressure reading      NEW MEDICATIONS STARTED DURING THIS VISIT:  ED Discharge Orders         Ordered    benzonatate (TESSALON PERLES) 100 MG capsule  3 times daily PRN     11/20/19 1711              This chart was dictated using voice recognition software/Dragon. Despite best efforts to proofread,  errors can occur which can change the meaning. Any change was purely unintentional.    Laban Emperor, PA-C 11/20/19 2300    Duffy Bruce, MD 11/22/19 [phone]

## 2019-11-20 NOTE — Discharge Instructions (Addendum)
Your lab work is all very reassuring.  Please keep a log of your blood pressure.  Your Covid results should be ready in 1 to 2 days.  You can use the [employer] for your cough.  Take Tylenol for any fever or body aches.  I given you a list of foods to help relieve your diarrhea.  Please drink plenty of fluids.

## 2019-11-20 NOTE — ED Triage Notes (Signed)
Pt to ED via POV c/o cough, headache, diarrhea. Pt denies fever, chills. Pt is in NAD. Sx's 4-5 days

## 2019-11-21 LAB — SARS CORONAVIRUS 2 (TAT 6-24 HRS): SARS Coronavirus 2: NEGATIVE

## 2020-05-30 ENCOUNTER — Telehealth: Payer: Self-pay | Admitting: General Practice

## 2020-05-30 NOTE — Telephone Encounter (Signed)
Individual has been contacted 3+ times regarding ED referral. No further attempts to contact individual will be made. 

## 2020-08-15 ENCOUNTER — Ambulatory Visit: Payer: Self-pay | Admitting: Gerontology

## 2020-08-15 ENCOUNTER — Ambulatory Visit: Payer: Self-pay | Admitting: Specialist

## 2020-08-16 ENCOUNTER — Ambulatory Visit: Payer: Self-pay | Admitting: Gerontology

## 2020-08-17 ENCOUNTER — Telehealth: Payer: Self-pay | Admitting: Gerontology

## 2020-08-17 NOTE — Telephone Encounter (Signed)
Called patient and did social determinants of health screening.  Patient showed a great need for housing and financial assistance. She gave verbal consent for the [facility] referral program for these needs.

## 2020-08-22 ENCOUNTER — Encounter: Payer: Self-pay | Admitting: Gerontology

## 2020-08-22 ENCOUNTER — Ambulatory Visit: Payer: Self-pay | Admitting: Gerontology

## 2020-08-22 ENCOUNTER — Other Ambulatory Visit: Payer: Self-pay

## 2020-08-22 ENCOUNTER — Other Ambulatory Visit: Payer: Self-pay | Admitting: Gerontology

## 2020-08-22 VITALS — BP 176/127 | HR 78 | Ht 64.0 in | Wt 259.7 lb

## 2020-08-22 DIAGNOSIS — M25571 Pain in right ankle and joints of right foot: Secondary | ICD-10-CM | POA: Insufficient documentation

## 2020-08-22 DIAGNOSIS — Z7689 Persons encountering health services in other specified circumstances: Secondary | ICD-10-CM | POA: Insufficient documentation

## 2020-08-22 DIAGNOSIS — R0683 Snoring: Secondary | ICD-10-CM | POA: Insufficient documentation

## 2020-08-22 DIAGNOSIS — I10 Essential (primary) hypertension: Secondary | ICD-10-CM

## 2020-08-22 DIAGNOSIS — R140 Abdominal distension (gaseous): Secondary | ICD-10-CM

## 2020-08-22 MED ORDER — HYDROCHLOROTHIAZIDE 25 MG PO TABS: 25.0000 mg | ORAL_TABLET | Freq: Every day | ORAL | 0 refills | Status: DC

## 2020-08-22 MED ORDER — LISINOPRIL 10 MG PO TABS: 10.0000 mg | ORAL_TABLET | Freq: Every day | ORAL | 0 refills | Status: DC

## 2020-08-22 NOTE — Patient Instructions (Signed)
Stroke Prevention Some medical conditions and lifestyle choices can lead to a higher risk for a stroke. You can help to prevent a stroke by making nutrition, lifestyle, and other changes. What nutrition changes can be made?   Eat healthy foods. ? Choose foods that are high in fiber. These include:  Fresh fruits.  Fresh vegetables.  Whole grains. ? Eat at least 5 or more servings of fruits and vegetables each day. Try to fill half of your plate at each meal with fruits and vegetables. ? Choose lean protein foods. These include:  Lowfat (lean) cuts of meat.  Chicken without skin.  Fish.  Tofu.  Beans.  Nuts. ? Eat low-fat dairy products. ? Avoid foods that:  Are high in salt (sodium).  Have saturated fat.  Have trans fat.  Have cholesterol.  Are processed.  Are premade.  Follow eating guidelines as told by your doctor. These may include: ? Reducing how many calories you eat and drink each day. ? Limiting how much salt you eat or drink each day to 1,500 milligrams (mg). ? Using only healthy fats for cooking. These include:  Olive oil.  Canola oil.  Sunflower oil. ? Counting how many carbohydrates you eat and drink each day. What lifestyle changes can be made?  Try to stay at a healthy weight. Talk to your doctor about what a good weight is for you.  Get at least 30 minutes of moderate physical activity at least 5 days a week. This can include: ? Fast walking. ? Biking. ? Swimming.  Do not use any products that have nicotine or tobacco. This includes cigarettes and e-cigarettes. If you need help quitting, ask your doctor. Avoid being around tobacco smoke in general.  Limit how much alcohol you drink to no more than 1 drink a day for nonpregnant women and 2 drinks a day for men. One drink equals 12 oz of beer, 5 oz of wine, or 1 oz of hard liquor.  Do not use drugs.  Avoid taking birth control pills. Talk to your doctor about the risks of taking birth  control pills if: ? You are over 35 years old. ? You smoke. ? You get migraines. ? You have had a blood clot. What other changes can be made?  Manage your cholesterol. ? It is important to eat a healthy diet. ? If your cholesterol cannot be managed through your diet, you may also need to take medicines. Take medicines as told by your doctor.  Manage your diabetes. ? It is important to eat a healthy diet and to exercise regularly. ? If your blood sugar cannot be managed through diet and exercise, you may need to take medicines. Take medicines as told by your doctor.  Control your high blood pressure (hypertension). ? Try to keep your blood pressure below 130/80. This can help lower your risk of stroke. ? It is important to eat a healthy diet and to exercise regularly. ? If your blood pressure cannot be managed through diet and exercise, you may need to take medicines. Take medicines as told by your doctor. ? Ask your doctor if you should check your blood pressure at home. ? Have your blood pressure checked every year. Do this even if your blood pressure is normal.  Talk to your doctor about getting checked for a sleep disorder. Signs of this can include: ? Snoring a lot. ? Feeling very tired.  Take over-the-counter and prescription medicines only as told by your doctor. These may   include aspirin or blood thinners (antiplatelets or anticoagulants).  Make sure that any other medical conditions you have are managed. Where to find more information  American Stroke Association: www.strokeassociation.org  National Stroke Association: www.stroke.org Get help right away if:  You have any symptoms of stroke. "BE FAST" is an easy way to remember the main warning signs: ? B - Balance. Signs are dizziness, sudden trouble walking, or loss of balance. ? E - Eyes. Signs are trouble seeing or a sudden change in how you see. ? F - Face. Signs are sudden weakness or loss of feeling of the face,  or the face or eyelid drooping on one side. ? A - Arms. Signs are weakness or loss of feeling in an arm. This happens suddenly and usually on one side of the body. ? S - Speech. Signs are sudden trouble speaking, slurred speech, or trouble understanding what people say. ? T - Time. Time to call emergency services. Write down what time symptoms started.  You have other signs of stroke, such as: ? A sudden, very bad headache with no known cause. ? Feeling sick to your stomach (nausea). ? Throwing up (vomiting). ? Jerky movements you cannot control (seizure). These symptoms may represent a serious problem that is an emergency. Do not wait to see if the symptoms will go away. Get medical help right away. Call your local emergency services (911 in the [place] Do not drive yourself to the hospital. Summary  You can prevent a stroke by eating healthy, exercising, not smoking, drinking less alcohol, and treating other health problems, such as diabetes, high blood pressure, or high cholesterol.  Do not use any products that contain nicotine or tobacco, such as cigarettes and e-cigarettes.  Get help right away if you have any signs or symptoms of a stroke. This information is not intended to replace advice given to you by your health care provider. Make sure you discuss any questions you have with your health care provider. Document Revised: 12/31/2018 Document Reviewed: 02/05/2017 Elsevier Patient Education  2020 [employer] DASH Eating Plan DASH stands for "Dietary Approaches to Stop Hypertension." The DASH eating plan is a healthy eating plan that has been shown to reduce high blood pressure (hypertension). It may also reduce your risk for type 2 diabetes, heart disease, and stroke. The DASH eating plan may also help with weight loss. What are tips for following this plan?  General guidelines  Avoid eating more than 2,300 mg (milligrams) of salt (sodium) a day. If you have hypertension, you may  need to reduce your sodium intake to 1,500 mg a day.  Limit alcohol intake to no more than 1 drink a day for nonpregnant women and 2 drinks a day for men. One drink equals 12 oz of beer, 5 oz of wine, or 1 oz of hard liquor.  Work with your health care provider to maintain a healthy body weight or to lose weight. Ask what an ideal weight is for you.  Get at least 30 minutes of exercise that causes your heart to beat faster (aerobic exercise) most days of the week. Activities may include walking, swimming, or biking.  Work with your health care provider or diet and nutrition specialist (dietitian) to adjust your eating plan to your individual calorie needs. Reading food labels   Check food labels for the amount of sodium per serving. Choose foods with less than 5 percent of the Daily Value of sodium. Generally, foods with less than 300 mg  of sodium per serving fit into this eating plan.  To find whole grains, look for the word "whole" as the first word in the ingredient list. Shopping  Buy products labeled as "low-sodium" or "no salt added."  Buy fresh foods. Avoid canned foods and premade or frozen meals. Cooking  Avoid adding salt when cooking. Use salt-free seasonings or herbs instead of table salt or sea salt. Check with your health care provider or pharmacist before using salt substitutes.  Do not fry foods. Cook foods using healthy methods such as baking, boiling, grilling, and broiling instead.  Cook with heart-healthy oils, such as olive, canola, soybean, or sunflower oil. Meal planning  Eat a balanced diet that includes: ? 5 or more servings of fruits and vegetables each day. At each meal, try to fill half of your plate with fruits and vegetables. ? Up to 6-8 servings of whole grains each day. ? Less than 6 oz of lean meat, poultry, or fish each day. A 3-oz serving of meat is about the same size as a deck of cards. One egg equals 1 oz. ? 2 servings of low-fat dairy each  day. ? A serving of nuts, seeds, or beans 5 times each week. ? Heart-healthy fats. Healthy fats called Omega-3 fatty acids are found in foods such as flaxseeds and coldwater fish, like sardines, salmon, and mackerel.  Limit how much you eat of the following: ? Canned or prepackaged foods. ? Food that is high in trans fat, such as fried foods. ? Food that is high in saturated fat, such as fatty meat. ? Sweets, desserts, sugary drinks, and other foods with added sugar. ? Full-fat dairy products.  Do not salt foods before eating.  Try to eat at least 2 vegetarian meals each week.  Eat more home-cooked food and less restaurant, buffet, and fast food.  When eating at a restaurant, ask that your food be prepared with less salt or no salt, if possible. What foods are recommended? The items listed may not be a complete list. Talk with your dietitian about what dietary choices are best for you. Grains Whole-grain or whole-wheat bread. Whole-grain or whole-wheat pasta. Brown rice. Orpah Cobb. Bulgur. Whole-grain and low-sodium cereals. Pita bread. Low-fat, low-sodium crackers. Whole-wheat flour tortillas. Vegetables Fresh or frozen vegetables (raw, steamed, roasted, or grilled). Low-sodium or reduced-sodium tomato and vegetable juice. Low-sodium or reduced-sodium tomato sauce and tomato paste. Low-sodium or reduced-sodium canned vegetables. Fruits All fresh, dried, or frozen fruit. Canned fruit in natural juice (without added sugar). Meat and other protein foods Skinless chicken or [place]. Ground chicken or [place]. Pork with fat trimmed off. Fish and seafood. Egg whites. Dried beans, peas, or lentils. Unsalted nuts, nut butters, and seeds. Unsalted canned beans. Lean cuts of beef with fat trimmed off. Low-sodium, lean deli meat. Dairy Low-fat (1%) or fat-free (skim) milk. Fat-free, low-fat, or reduced-fat cheeses. Nonfat, low-sodium ricotta or cottage cheese. Low-fat or nonfat yogurt.  Low-fat, low-sodium cheese. Fats and oils Soft margarine without trans fats. Vegetable oil. Low-fat, reduced-fat, or light mayonnaise and salad dressings (reduced-sodium). Canola, safflower, olive, soybean, and sunflower oils. Avocado. Seasoning and other foods Herbs. Spices. Seasoning mixes without salt. Unsalted popcorn and pretzels. Fat-free sweets. What foods are not recommended? The items listed may not be a complete list. Talk with your dietitian about what dietary choices are best for you. Grains Baked goods made with fat, such as croissants, muffins, or some breads. Dry pasta or rice meal packs. Vegetables Creamed or fried  vegetables. Vegetables in a cheese sauce. Regular canned vegetables (not low-sodium or reduced-sodium). Regular canned tomato sauce and paste (not low-sodium or reduced-sodium). Regular tomato and vegetable juice (not low-sodium or reduced-sodium). Rosita Fire. Olives. Fruits Canned fruit in a light or heavy syrup. Fried fruit. Fruit in cream or butter sauce. Meat and other protein foods Fatty cuts of meat. Ribs. Fried meat. Tomasa Blase. Sausage. Bologna and other processed lunch meats. Salami. Fatback. Hotdogs. Bratwurst. Salted nuts and seeds. Canned beans with added salt. Canned or smoked fish. Whole eggs or egg yolks. Chicken or [place] with skin. Dairy Whole or 2% milk, cream, and half-and-half. Whole or full-fat cream cheese. Whole-fat or sweetened yogurt. Full-fat cheese. Nondairy creamers. Whipped toppings. Processed cheese and cheese spreads. Fats and oils Butter. Stick margarine. Lard. Shortening. Ghee. Bacon fat. Tropical oils, such as coconut, palm kernel, or palm oil. Seasoning and other foods Salted popcorn and pretzels. Onion salt, garlic salt, seasoned salt, table salt, and sea salt. Worcestershire sauce. Tartar sauce. Barbecue sauce. Teriyaki sauce. Soy sauce, including reduced-sodium. Steak sauce. Canned and packaged gravies. Fish sauce. Oyster sauce. Cocktail  sauce. Horseradish that you find on the shelf. Ketchup. Mustard. Meat flavorings and tenderizers. Bouillon cubes. Hot sauce and Tabasco sauce. Premade or packaged marinades. Premade or packaged taco seasonings. Relishes. Regular salad dressings. Where to find more information:  National Heart, Lung, and Blood Institute: PopSteam.is  American Heart Association: www.heart.org Summary  The DASH eating plan is a healthy eating plan that has been shown to reduce high blood pressure (hypertension). It may also reduce your risk for type 2 diabetes, heart disease, and stroke.  With the DASH eating plan, you should limit salt (sodium) intake to 2,300 mg a day. If you have hypertension, you may need to reduce your sodium intake to 1,500 mg a day.  When on the DASH eating plan, aim to eat more fresh fruits and vegetables, whole grains, lean proteins, low-fat dairy, and heart-healthy fats.  Work with your health care provider or diet and nutrition specialist (dietitian) to adjust your eating plan to your individual calorie needs. This information is not intended to replace advice given to you by your health care provider. Make sure you discuss any questions you have with your health care provider. Document Revised: 10/17/2017 Document Reviewed: 10/28/2016 Elsevier Patient Education  2020 [employer].

## 2020-08-22 NOTE — Progress Notes (Signed)
Patient ID: Barbara Petersen, female   DOB: 08-06-1971, 49 y.o.   MRN: [id number]  Chief Complaint  Patient presents with  . Establish Care  . Hypertension    no HCTZ 57m for 2-3 weeks  . Bloated  . Snoring    gasps for air    HPI Barbara Weideis a 49y.o. female who  presents today as a new patient to establish care and review her current medical problems. She reports that she has been without any medications for 2-3 weeks. She states that she has a history of hypertension that she was previously on hydrochlorothiazide 278mfor. Blood pressure today was 190/114 with a recheck of 176/127. She denies regularly checking her blood pressure at home. She denies chest pain, dizziness, headache, or loss of consciousness. She reports that she smokes 3-4 cigarettes per day and is not ready to quit. She also reports abdominal bloating that she has been experiencing for over a year. States that it comes and goes and she estimates that she feels bloated 2 weeks out of a month. States that nothing makes it worse, but belching makes it better. States that it does not cause pain, but describes it as an uncomfortable pressure in her upper abdominal quadrants. She has tried no medication for it. She states that she has been experiencing some constipation as well over the past few years. States that she has a bowel movement every 2-3 days. States that she does occasionally take stool softeners or at home enemas to relieve this symptom. She states that she does not eat a lot and has tried increasing her fiber intake. Denies bleeding or pain with bowel movements. She also reports snoring for the last 2 years. She states that she feels it coincides with gaining a lot of weight since a R ankle surgery. She states that the snoring wakes her up some nights out of the week and can wake her up twice in one night. States that when she wakes up, she feels like she is drowning and needs to gasp for air. She reports difficulty falling  asleep, but states that she is not tired or fatigued during the day and usually feels well-rested. Lastly, she states that she broke her R ankle in December 2019 and had ORIF surgery. She reports continued pain and swelling in her R ankle and states that she has been told that she also has arthritis in the area. She states that it keep her from working. States that the pain worsens with activity and resting or elevating improves the pain. States that she has tried tylenol and ibuprofen without relief. States that the pain is 10/10. She is ambulatory.Overall, patient states that she is doing well and offers no further complaint.   Hypertension This is a chronic problem. The current episode started more than 1 year ago. The problem is uncontrolled. There are no associated agents to hypertension. Risk factors for coronary artery disease include sedentary lifestyle, smoking/tobacco exposure and obesity. Past treatments include diuretics, ACE inhibitors and lifestyle changes.    Past Medical History:  Diagnosis Date  . Arthritis    right ankle  . Complication of anesthesia 2013   seizure during appendectomy  . GERD (gastroesophageal reflux disease)    occ no meds  . Headache    migraines  . History of methicillin resistant staphylococcus aureus (MRSA) 2010  . Hypertension    been off bp meds due to no insurance 5-6 months  . Seizures ([facility]  last seizure was in May 2020    Past Surgical History:  Procedure Laterality Date  . APPENDECTOMY    . CHOLECYSTECTOMY    . HARDWARE REMOVAL Right 05/13/2019   Procedure: HARDWARE REMOVAL POSTERIOR RIGHT ANKLE;  Surgeon: Hessie Knows, MD;  Location: [facility] OR[facility];  Service: Orthopedics;  Laterality: Right;  . ORIF ANKLE FRACTURE Right 11/10/2018   Procedure: OPEN REDUCTION INTERNAL FIXATION (ORIF) ANKLE FRACTURE;  Surgeon: Hessie Knows, MD;  Location: [facility] OR[facility];  Service: Orthopedics;  Laterality: Right;  . TUBAL LIGATION      History reviewed. No  pertinent family history.  Social History Social History   Tobacco Use  . Smoking status: Current Every Day Smoker    Packs/day: 0.25    Years: 15.00    Pack years: 3.75    Types: Cigarettes  . Smokeless tobacco: Never Used  Vaping Use  . Vaping Use: Never used  Substance Use Topics  . Alcohol use: Yes    Comment: monthly-4-5 drinks  . Drug use: No    No Known Allergies  Current Outpatient Medications  Medication Sig Dispense Refill  . hydrochlorothiazide (HYDRODIURIL) 25 MG tablet Take 1 tablet (25 mg total) by mouth daily. 30 tablet 0  . lisinopril (ZESTRIL) 10 MG tablet Take 1 tablet (10 mg total) by mouth daily. 30 tablet 0   No current facility-administered medications for this visit.    Review of Systems Review of Systems  Constitutional: Negative.   HENT: Negative.   Eyes: Negative.   Respiratory: Negative.   Cardiovascular: Negative.   Gastrointestinal: Positive for abdominal distention and constipation.  Endocrine: Negative.   Genitourinary: Negative.   Musculoskeletal: Positive for arthralgias.       R ankle pain  Skin: Negative.   Allergic/Immunologic: Negative.   Neurological: Negative.   Hematological: Negative.   Psychiatric/Behavioral: Negative.     Blood pressure (!) 176/127, pulse 78, height _0  (1.626 m), weight 259 lb 11.2 oz (117.8 kg), SpO2 96 %.  Patient was instructed to lose weight.   Physical Exam Physical Exam Constitutional:      Appearance: Normal appearance. She is obese.  HENT:     Head: Normocephalic.  Eyes:     Pupils: Pupils are equal, round, and reactive to light.  Cardiovascular:     Rate and Rhythm: Normal rate and regular rhythm.     Heart sounds: Normal heart sounds.  Pulmonary:     Effort: Pulmonary effort is normal.     Breath sounds: Normal breath sounds.  Abdominal:     General: Abdomen is flat. Bowel sounds are normal.     Palpations: Abdomen is soft.     Tenderness: There is abdominal tenderness.      Comments: Patient reports some tenderness in her RLQ with palpation  Musculoskeletal:     Right ankle: Swelling present. Tenderness present.     Comments: R ankle swollen and tender  Skin:    General: Skin is warm and dry.     Capillary Refill: Capillary refill takes less than 2 seconds.  Neurological:     General: No focal deficit present.     Mental Status: She is alert and oriented to person, place, and time.  Psychiatric:        Mood and Affect: Mood normal.        Behavior: Behavior normal.        Thought Content: Thought content normal.        Judgment: Judgment normal.  Assessment and Plan 1. Encounter to establish care Return on 09/06/20 for lab draw and 09/13/20 for baseline lab review. - Comp Met (CMET); Future - CBC w/Diff; Future - Urinalysis; Future - TSH; Future - Lipid Profile; Future - HgB A1c; Future  2. Essential hypertension Start hydrochlorothiazide and lisinopril as prescribed. Instructed to take her blood pressure daily and keep a log for next visit.  Verbalized understanding to call the clinic on Thursday 08/24/20 to report her blood pressure readings after starting her medications today. Educated on [place] and importance of increasing her activity. Encouraged to stop smoking.  Educated on signs and symptoms of stroke and to call 911 if she experiences any of them. - EKG 12-Lead; Future - hydrochlorothiazide (HYDRODIURIL) 25 MG tablet; Take 1 tablet (25 mg total) by mouth daily.  Dispense: 30 tablet; Refill: 0 - lisinopril (ZESTRIL) 10 MG tablet; Take 1 tablet (10 mg total) by mouth daily.  Dispense: 30 tablet; Refill: 0  3. Abdominal bloating Continue increasing fiber in your diet.  Follow up with Gastroenterology for chronic bloating and constipation. - Ambulatory referral to Gastroenterology  4. Chronic pain of right ankle Continue RICE (rest, ice, compression, elevation) for your ankle pain. Follow up appointment with Dr. Vickki Hearing in clinic  on 09/05/2020.  5. Snoring Advised to lose weight as this can help with snoring. Trial raising the head of the bed.  If possible, avoid the supine position while sleeping.  Follow up with neurology for sleep assessment.  - Ambulatory referral to Neurology  Return in about 22 days (around 09/13/2020), or if symptoms worsen or fail to improve.   Marina Gravel Student NP 08/22/2020, 11:57 AM

## 2020-09-05 ENCOUNTER — Other Ambulatory Visit: Payer: Self-pay

## 2020-09-05 ENCOUNTER — Ambulatory Visit: Payer: Self-pay | Admitting: Specialist

## 2020-09-05 DIAGNOSIS — G8929 Other chronic pain: Secondary | ICD-10-CM

## 2020-09-05 NOTE — Progress Notes (Addendum)
       Subjective:     Patient ID: Barbara Petersen, female   DOB: 1971-01-21, 49 y.o.   MRN: [id number]    Ms. Teall a 49 year old female presents for chronic pain to right ankle that has been going on for many months. She is morbidly obessed female sustained probably a fracture of right distal fibula. Treated with ORIF and subsequent hard ware removal. She has being told she has arthritis and her treating Surgeon has noting to offer. 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         Objective:   Physical Exam  GAIT pulling, walks with limp to the right side, stand on her toes. Able to take few steps on her heels. On inspection; no obvious deformity or erythema.She is a well healed 8 cm scar laterally.Figure of 8 measurement are 59 cm on the right and 56 cm on the left.She is tender about the upper portion of the scar. It should be noted that the plates and screwed has been removed.ROM on the left 20 degrees DF with the knee extended and flexed. She is 50 degrees of PF. Their is 20 degrees of INVERSION and 10 degrees of EVERSION. On the right, she has Zero degrees DF with an extended and 10 degrees with knee flexed. She has 10 degrees of INVERSION and 5 degrees of Eversion. She has 20 degrees of PF.    Assessment:         Plan:     X ray of right ankle and follow up afterwards.

## 2020-09-06 ENCOUNTER — Other Ambulatory Visit: Payer: Self-pay | Admitting: Gerontology

## 2020-09-06 ENCOUNTER — Other Ambulatory Visit: Payer: Self-pay

## 2020-09-06 ENCOUNTER — Ambulatory Visit
Admission: RE | Admit: 2020-09-06 | Discharge: 2020-09-06 | Disposition: A | Discharge status: Home / Self Care | Payer: Self-pay | Attending: Gerontology | Admitting: Gerontology

## 2020-09-06 ENCOUNTER — Ambulatory Visit
Admission: RE | Admit: 2020-09-06 | Discharge: 2020-09-06 | Disposition: A | Discharge status: Home / Self Care | Payer: Self-pay | Source: Ambulatory Visit | Attending: Gerontology | Admitting: Gerontology

## 2020-09-06 DIAGNOSIS — Z7689 Persons encountering health services in other specified circumstances: Secondary | ICD-10-CM

## 2020-09-06 DIAGNOSIS — M25571 Pain in right ankle and joints of right foot: Secondary | ICD-10-CM

## 2020-09-06 DIAGNOSIS — G8929 Other chronic pain: Secondary | ICD-10-CM

## 2020-09-06 DIAGNOSIS — Z23 Encounter for immunization: Secondary | ICD-10-CM

## 2020-09-06 NOTE — Progress Notes (Addendum)
Flu vaccine given in the left deltoid muscle  with Nursing student Loma Messing.

## 2020-09-07 LAB — URINALYSIS
Bilirubin, UA: NEGATIVE
Glucose, UA: NEGATIVE
Ketones, UA: NEGATIVE
Leukocytes,UA: NEGATIVE
Nitrite, UA: NEGATIVE
Protein,UA: NEGATIVE
RBC, UA: NEGATIVE
Specific Gravity, UA: 1.023 (ref 1.005–1.030)
Urobilinogen, Ur: 0.2 mg/dL (ref 0.2–1.0)
pH, UA: 5.5 (ref 5.0–7.5)

## 2020-09-07 LAB — HEMOGLOBIN A1C
Est. average glucose Bld gHb Est-mCnc: 126 mg/dL
Hgb A1c MFr Bld: 6 % — ABNORMAL HIGH (ref 4.8–5.6)

## 2020-09-07 LAB — CBC WITH DIFFERENTIAL/PLATELET
Basophils Absolute: 0 10*3/uL (ref 0.0–0.2)
Basos: 1 %
EOS (ABSOLUTE): 0.2 10*3/uL (ref 0.0–0.4)
Eos: 2 %
Hematocrit: 41.8 % (ref 34.0–46.6)
Hemoglobin: 13.6 g/dL (ref 11.1–15.9)
Immature Grans (Abs): 0 10*3/uL (ref 0.0–0.1)
Immature Granulocytes: 0 %
Lymphocytes Absolute: 2.9 10*3/uL (ref 0.7–3.1)
Lymphs: 45 %
MCH: 28.1 pg (ref 26.6–33.0)
MCHC: 32.5 g/dL (ref 31.5–35.7)
MCV: 86 fL (ref 79–97)
Monocytes Absolute: 0.4 10*3/uL (ref 0.1–0.9)
Monocytes: 7 %
Neutrophils Absolute: 2.8 10*3/uL (ref 1.4–7.0)
Neutrophils: 45 %
Platelets: 336 10*3/uL (ref 150–450)
RBC: 4.84 x10E6/uL (ref 3.77–5.28)
RDW: 15.2 % (ref 11.7–15.4)
WBC: 6.3 10*3/uL (ref 3.4–10.8)

## 2020-09-07 LAB — LIPID PANEL
Chol/HDL Ratio: 5 ratio — ABNORMAL HIGH (ref 0.0–4.4)
Cholesterol, Total: 196 mg/dL (ref 100–199)
HDL: 39 mg/dL — ABNORMAL LOW (ref >39)
LDL Chol Calc (NIH): 124 mg/dL — ABNORMAL HIGH (ref 0–99)
Triglycerides: 187 mg/dL — ABNORMAL HIGH (ref 0–149)
VLDL Cholesterol Cal: 33 mg/dL (ref 5–40)

## 2020-09-07 LAB — COMPREHENSIVE METABOLIC PANEL
ALT: 14 IU/L (ref 0–32)
AST: 14 IU/L (ref 0–40)
Albumin/Globulin Ratio: 1.6 (ref 1.2–2.2)
Albumin: 4.7 g/dL (ref 3.8–4.8)
Alkaline Phosphatase: 71 IU/L (ref 44–121)
BUN/Creatinine Ratio: 15 (ref 9–23)
BUN: 14 mg/dL (ref 6–24)
Bilirubin Total: <0.2 mg/dL (ref 0.0–1.2)
CO2: 25 mmol/L (ref 20–29)
Calcium: 9.6 mg/dL (ref 8.7–10.2)
Chloride: 100 mmol/L (ref 96–106)
Creatinine, Ser: 0.93 mg/dL (ref 0.57–1.00)
GFR calc Af Amer: 85 mL/min/{1.73_m2} (ref >59)
GFR calc non Af Amer: 74 mL/min/{1.73_m2} (ref >59)
Globulin, Total: 2.9 g/dL (ref 1.5–4.5)
Glucose: 115 mg/dL — ABNORMAL HIGH (ref 65–99)
Potassium: 4.3 mmol/L (ref 3.5–5.2)
Sodium: 138 mmol/L (ref 134–144)
Total Protein: 7.6 g/dL (ref 6.0–8.5)

## 2020-09-07 LAB — TSH: TSH: 1.58 u[IU]/mL (ref 0.450–4.500)

## 2020-09-13 ENCOUNTER — Ambulatory Visit: Payer: Self-pay | Admitting: Gerontology

## 2020-09-21 ENCOUNTER — Other Ambulatory Visit: Payer: Self-pay

## 2020-09-21 ENCOUNTER — Encounter: Payer: Self-pay | Admitting: Gerontology

## 2020-09-21 ENCOUNTER — Ambulatory Visit: Payer: Self-pay | Admitting: Gerontology

## 2020-09-21 ENCOUNTER — Telehealth: Payer: Self-pay

## 2020-09-21 VITALS — BP 136/94 | HR 73 | Temp 97.0°F | Resp 15 | Wt 259.4 lb

## 2020-09-21 DIAGNOSIS — R7303 Prediabetes: Secondary | ICD-10-CM

## 2020-09-21 DIAGNOSIS — E785 Hyperlipidemia, unspecified: Secondary | ICD-10-CM

## 2020-09-21 DIAGNOSIS — I10 Essential (primary) hypertension: Secondary | ICD-10-CM

## 2020-09-21 DIAGNOSIS — F17200 Nicotine dependence, unspecified, uncomplicated: Secondary | ICD-10-CM | POA: Insufficient documentation

## 2020-09-21 MED ORDER — PRAVASTATIN SODIUM 10 MG PO TABS: 10.0000 mg | ORAL_TABLET | Freq: Every day | ORAL | 0 refills | Status: DC

## 2020-09-21 MED ORDER — LISINOPRIL 20 MG PO TABS: 20.0000 mg | ORAL_TABLET | Freq: Every day | ORAL | 0 refills | Status: DC

## 2020-09-21 MED ORDER — METFORMIN HCL 1000 MG PO TABS: 500.0000 mg | ORAL_TABLET | Freq: Every day | ORAL | 0 refills | Status: DC

## 2020-09-21 NOTE — Telephone Encounter (Signed)
Called patient to let her know about a resource that may be useful to her.

## 2020-09-21 NOTE — Patient Instructions (Signed)
Fat and Cholesterol Restricted Eating Plan °Getting too much fat and cholesterol in your diet may cause health problems. Choosing the right foods helps keep your fat and cholesterol at normal levels. This can keep you from getting certain diseases. °Your doctor may recommend an eating plan that includes: °· Total fat: ______% or less of total calories a day. °· Saturated fat: ______% or less of total calories a day. °· Cholesterol: less than _________mg a day. °· Fiber: ______g a day. °What are tips for following this plan? °Meal planning °· At meals, divide your plate into four equal parts: °? Fill one-half of your plate with vegetables and green salads. °? Fill one-fourth of your plate with whole grains. °? Fill one-fourth of your plate with low-fat (lean) protein foods. °· Eat fish that is high in omega-3 fats at least two times a week. This includes mackerel, tuna, sardines, and salmon. °· Eat foods that are high in fiber, such as whole grains, beans, apples, broccoli, carrots, peas, and barley. °General tips ° °· Work with your doctor to lose weight if you need to. °· Avoid: °? Foods with added sugar. °? Fried foods. °? Foods with partially hydrogenated oils. °· Limit alcohol intake to no more than 1 drink a day for nonpregnant women and 2 drinks a day for men. One drink equals 12 oz of beer, 5 oz of wine, or 1½ oz of hard liquor. °Reading food labels °· Check food labels for: °? Trans fats. °? Partially hydrogenated oils. °? Saturated fat (g) in each serving. °? Cholesterol (mg) in each serving. °? Fiber (g) in each serving. °· Choose foods with healthy fats, such as: °? Monounsaturated fats. °? Polyunsaturated fats. °? Omega-3 fats. °· Choose grain products that have whole grains. Look for the word "whole" as the first word in the ingredient list. °Cooking °· Cook foods using low-fat methods. These include baking, boiling, grilling, and broiling. °· Eat more home-cooked foods. Eat at restaurants and buffets  less often. °· Avoid cooking using saturated fats, such as butter, cream, palm oil, palm kernel oil, and coconut oil. °Recommended foods ° °Fruits °· All fresh, canned (in natural juice), or frozen fruits. °Vegetables °· Fresh or frozen vegetables (raw, steamed, roasted, or grilled). Green salads. °Grains °· Whole grains, such as whole wheat or whole grain breads, crackers, cereals, and pasta. Unsweetened oatmeal, bulgur, barley, quinoa, or brown rice. Corn or whole wheat flour tortillas. °Meats and other protein foods °· Ground beef (85% or leaner), grass-fed beef, or beef trimmed of fat. Skinless chicken or turkey. Ground chicken or turkey. Pork trimmed of fat. All fish and seafood. Egg whites. Dried beans, peas, or lentils. Unsalted nuts or seeds. Unsalted canned beans. Nut butters without added sugar or oil. °Dairy °· Low-fat or nonfat dairy products, such as skim or 1% milk, 2% or reduced-fat cheeses, low-fat and fat-free ricotta or cottage cheese, or plain low-fat and nonfat yogurt. °Fats and oils °· Tub margarine without trans fats. Light or reduced-fat mayonnaise and salad dressings. Avocado. Olive, canola, sesame, or safflower oils. °The items listed above may not be a complete list of foods and beverages you can eat. Contact a dietitian for more information. °Foods to avoid °Fruits °· Canned fruit in heavy syrup. Fruit in cream or butter sauce. Fried fruit. °Vegetables °· Vegetables cooked in cheese, cream, or butter sauce. Fried vegetables. °Grains °· White bread. White pasta. White rice. Cornbread. Bagels, pastries, and croissants. Crackers and snack foods that contain trans fat   and hydrogenated oils. °Meats and other protein foods °· Fatty cuts of meat. Ribs, chicken wings, bacon, sausage, bologna, salami, chitterlings, fatback, hot dogs, bratwurst, and packaged lunch meats. Liver and organ meats. Whole eggs and egg yolks. Chicken and turkey with skin. Fried meat. °Dairy °· Whole or 2% milk, cream,  half-and-half, and cream cheese. Whole milk cheeses. Whole-fat or sweetened yogurt. Full-fat cheeses. Nondairy creamers and whipped toppings. Processed cheese, cheese spreads, and cheese curds. °Beverages °· Alcohol. Sugar-sweetened drinks such as sodas, lemonade, and fruit drinks. °Fats and oils °· Butter, stick margarine, lard, shortening, ghee, or bacon fat. Coconut, palm kernel, and palm oils. °Sweets and desserts °· Corn syrup, sugars, honey, and molasses. Candy. Jam and jelly. Syrup. Sweetened cereals. Cookies, pies, cakes, donuts, muffins, and ice cream. °The items listed above may not be a complete list of foods and beverages you should avoid. Contact a dietitian for more information. °Summary °· Choosing the right foods helps keep your fat and cholesterol at normal levels. This can keep you from getting certain diseases. °· At meals, fill one-half of your plate with vegetables and green salads. °· Eat high-fiber foods, like whole grains, beans, apples, carrots, peas, and barley. °· Limit added sugar, saturated fats, alcohol, and fried foods. °This information is not intended to replace advice given to you by your health care provider. Make sure you discuss any questions you have with your health care provider. °Document Revised: 07/08/2018 Document Reviewed: 07/22/2017 °[employer]vier Patient Education © 2020 [employer] °Carbohydrate Counting for Diabetes Mellitus, Adult ° °Carbohydrate counting is a method of keeping track of how many carbohydrates you eat. Eating carbohydrates naturally increases the amount of sugar (glucose) in the blood. Counting how many carbohydrates you eat helps keep your blood glucose within normal limits, which helps you manage your diabetes (diabetes mellitus). °It is important to know how many carbohydrates you can safely have in each meal. This is different for every person. A diet and nutrition specialist (registered dietitian) can help you make a meal plan and calculate how many  carbohydrates you should have at each meal and snack. °Carbohydrates are found in the following foods: °· Grains, such as breads and cereals. °· Dried beans and soy products. °· Starchy vegetables, such as potatoes, peas, and corn. °· Fruit and fruit juices. °· Milk and yogurt. °· Sweets and snack foods, such as cake, cookies, candy, chips, and soft drinks. °How do I count carbohydrates? °There are two ways to count carbohydrates in food. You can use either of the methods or a combination of both. °Reading "Nutrition Facts" on packaged food °The "Nutrition Facts" list is included on the labels of almost all packaged foods and beverages in the [place] It includes: °· The serving size. °· Information about nutrients in each serving, including the grams (g) of carbohydrate per serving. °To use the “Nutrition Facts": °· Decide how many servings you will have. °· Multiply the number of servings by the number of carbohydrates per serving. °· The resulting number is the total amount of carbohydrates that you will be having. °Learning standard serving sizes of other foods °When you eat carbohydrate foods that are not packaged or do not include "Nutrition Facts" on the label, you need to measure the servings in order to count the amount of carbohydrates: °· Measure the foods that you will eat with a food scale or measuring cup, if needed. °· Decide how many standard-size servings you will eat. °· Multiply the number of servings by 15.   Most carbohydrate-rich foods have about 15 g of carbohydrates per serving. °? For example, if you eat 8 oz (170 g) of strawberries, you will have eaten 2 servings and 30 g of carbohydrates (2 servings x 15 g = 30 g). °· For foods that have more than one food mixed, such as soups and casseroles, you must count the carbohydrates in each food that is included. °The following list contains standard serving sizes of common carbohydrate-rich foods. Each of these servings has about 15 g of  carbohydrates: °· ½ hamburger bun or ½ English muffin. °· ½ oz (15 mL) syrup. °· ½ oz (14 g) jelly. °· 1 slice of bread. °· 1 six-inch tortilla. °· 3 oz (85 g) cooked rice or pasta. °· 4 oz (113 g) cooked dried beans. °· 4 oz (113 g) starchy vegetable, such as peas, corn, or potatoes. °· 4 oz (113 g) hot cereal. °· 4 oz (113 g) mashed potatoes or ¼ of a large baked potato. °· 4 oz (113 g) canned or frozen fruit. °· 4 oz (120 mL) fruit juice. °· 4-6 crackers. °· 6 chicken nuggets. °· 6 oz (170 g) unsweetened dry cereal. °· 6 oz (170 g) plain fat-free yogurt or yogurt sweetened with artificial sweeteners. °· 8 oz (240 mL) milk. °· 8 oz (170 g) fresh fruit or one small piece of fruit. °· 24 oz (680 g) popped popcorn. °Example of carbohydrate counting °Sample meal °· 3 oz (85 g) chicken breast. °· 6 oz (170 g) brown rice. °· 4 oz (113 g) corn. °· 8 oz (240 mL) milk. °· 8 oz (170 g) strawberries with sugar-free whipped topping. °Carbohydrate calculation °1. Identify the foods that contain carbohydrates: °? Rice. °? Corn. °? Milk. °? Strawberries. °2. Calculate how many servings you have of each food: °? 2 servings rice. °? 1 serving corn. °? 1 serving milk. °? 1 serving strawberries. °3. Multiply each number of servings by 15 g: °? 2 servings rice x 15 g = 30 g. °? 1 serving corn x 15 g = 15 g. °? 1 serving milk x 15 g = 15 g. °? 1 serving strawberries x 15 g = 15 g. °4. Add together all of the amounts to find the total grams of carbohydrates eaten: °? 30 g + 15 g + 15 g + 15 g = 75 g of carbohydrates total. °Summary °· Carbohydrate counting is a method of keeping track of how many carbohydrates you eat. °· Eating carbohydrates naturally increases the amount of sugar (glucose) in the blood. °· Counting how many carbohydrates you eat helps keep your blood glucose within normal limits, which helps you manage your diabetes. °· A diet and nutrition specialist (registered dietitian) can help you make a meal plan and calculate  how many carbohydrates you should have at each meal and snack. °This information is not intended to replace advice given to you by your health care provider. Make sure you discuss any questions you have with your health care provider. °Document Revised: 05/29/2017 Document Reviewed: 04/17/2016 °[employer]vier Patient Education © 2020 [employer] °DASH Eating Plan °DASH stands for "Dietary Approaches to Stop Hypertension." The DASH eating plan is a healthy eating plan that has been shown to reduce high blood pressure (hypertension). It may also reduce your risk for type 2 diabetes, heart disease, and stroke. The DASH eating plan may also help with weight loss. °What are tips for following this plan? ° °General guidelines °· Avoid eating more than 2,300 mg (  milligrams) of salt (sodium) a day. If you have hypertension, you may need to reduce your sodium intake to 1,500 mg a day. °· Limit alcohol intake to no more than 1 drink a day for nonpregnant women and 2 drinks a day for men. One drink equals 12 oz of beer, 5 oz of wine, or 1½ oz of hard liquor. °· Work with your health care provider to maintain a healthy body weight or to lose weight. Ask what an ideal weight is for you. °· Get at least 30 minutes of exercise that causes your heart to beat faster (aerobic exercise) most days of the week. Activities may include walking, swimming, or biking. °· Work with your health care provider or diet and nutrition specialist (dietitian) to adjust your eating plan to your individual calorie needs. °Reading food labels ° °· Check food labels for the amount of sodium per serving. Choose foods with less than 5 percent of the Daily Value of sodium. Generally, foods with less than 300 mg of sodium per serving fit into this eating plan. °· To find whole grains, look for the word "whole" as the first word in the ingredient list. °Shopping °· Buy products labeled as "low-sodium" or "no salt added." °· Buy fresh foods. Avoid canned foods and  premade or frozen meals. °Cooking °· Avoid adding salt when cooking. Use salt-free seasonings or herbs instead of table salt or sea salt. Check with your health care provider or pharmacist before using salt substitutes. °· Do not fry foods. Cook foods using healthy methods such as baking, boiling, grilling, and broiling instead. °· Cook with heart-healthy oils, such as olive, canola, soybean, or sunflower oil. °Meal planning °· Eat a balanced diet that includes: °? 5 or more servings of fruits and vegetables each day. At each meal, try to fill half of your plate with fruits and vegetables. °? Up to 6-8 servings of whole grains each day. °? Less than 6 oz of lean meat, poultry, or fish each day. A 3-oz serving of meat is about the same size as a deck of cards. One egg equals 1 oz. °? 2 servings of low-fat dairy each day. °? A serving of nuts, seeds, or beans 5 times each week. °? Heart-healthy fats. Healthy fats called Omega-3 fatty acids are found in foods such as flaxseeds and coldwater fish, like sardines, salmon, and mackerel. °· Limit how much you eat of the following: °? Canned or prepackaged foods. °? Food that is high in trans fat, such as fried foods. °? Food that is high in saturated fat, such as fatty meat. °? Sweets, desserts, sugary drinks, and other foods with added sugar. °? Full-fat dairy products. °· Do not salt foods before eating. °· Try to eat at least 2 vegetarian meals each week. °· Eat more home-cooked food and less restaurant, buffet, and fast food. °· When eating at a restaurant, ask that your food be prepared with less salt or no salt, if possible. °What foods are recommended? °The items listed may not be a complete list. Talk with your dietitian about what dietary choices are best for you. °Grains °Whole-grain or whole-wheat bread. Whole-grain or whole-wheat pasta. Brown rice. Oatmeal. Quinoa. Bulgur. Whole-grain and low-sodium cereals. Pita bread. Low-fat, low-sodium crackers. Whole-wheat  flour tortillas. °Vegetables °Fresh or frozen vegetables (raw, steamed, roasted, or grilled). Low-sodium or reduced-sodium tomato and vegetable juice. Low-sodium or reduced-sodium tomato sauce and tomato paste. Low-sodium or reduced-sodium canned vegetables. °Fruits °All fresh, dried, or frozen fruit.   Canned fruit in natural juice (without added sugar). °Meat and other protein foods °Skinless chicken or turkey. Ground chicken or turkey. Pork with fat trimmed off. Fish and seafood. Egg whites. Dried beans, peas, or lentils. Unsalted nuts, nut butters, and seeds. Unsalted canned beans. Lean cuts of beef with fat trimmed off. Low-sodium, lean deli meat. °Dairy °Low-fat (1%) or fat-free (skim) milk. Fat-free, low-fat, or reduced-fat cheeses. Nonfat, low-sodium ricotta or cottage cheese. Low-fat or nonfat yogurt. Low-fat, low-sodium cheese. °Fats and oils °Soft margarine without trans fats. Vegetable oil. Low-fat, reduced-fat, or light mayonnaise and salad dressings (reduced-sodium). Canola, safflower, olive, soybean, and sunflower oils. Avocado. °Seasoning and other foods °Herbs. Spices. Seasoning mixes without salt. Unsalted popcorn and pretzels. Fat-free sweets. °What foods are not recommended? °The items listed may not be a complete list. Talk with your dietitian about what dietary choices are best for you. °Grains °Baked goods made with fat, such as croissants, muffins, or some breads. Dry pasta or rice meal packs. °Vegetables °Creamed or fried vegetables. Vegetables in a cheese sauce. Regular canned vegetables (not low-sodium or reduced-sodium). Regular canned tomato sauce and paste (not low-sodium or reduced-sodium). Regular tomato and vegetable juice (not low-sodium or reduced-sodium). Pickles. Olives. °Fruits °Canned fruit in a light or heavy syrup. Fried fruit. Fruit in cream or butter sauce. °Meat and other protein foods °Fatty cuts of meat. Ribs. Fried meat. Bacon. Sausage. Bologna and other processed lunch  meats. Salami. Fatback. Hotdogs. Bratwurst. Salted nuts and seeds. Canned beans with added salt. Canned or smoked fish. Whole eggs or egg yolks. Chicken or turkey with skin. °Dairy °Whole or 2% milk, cream, and half-and-half. Whole or full-fat cream cheese. Whole-fat or sweetened yogurt. Full-fat cheese. Nondairy creamers. Whipped toppings. Processed cheese and cheese spreads. °Fats and oils °Butter. Stick margarine. Lard. Shortening. Ghee. Bacon fat. Tropical oils, such as coconut, palm kernel, or palm oil. °Seasoning and other foods °Salted popcorn and pretzels. Onion salt, garlic salt, seasoned salt, table salt, and sea salt. Worcestershire sauce. Tartar sauce. Barbecue sauce. Teriyaki sauce. Soy sauce, including reduced-sodium. Steak sauce. Canned and packaged gravies. Fish sauce. Oyster sauce. Cocktail sauce. Horseradish that you find on the shelf. Ketchup. Mustard. Meat flavorings and tenderizers. Bouillon cubes. Hot sauce and Tabasco sauce. Premade or packaged marinades. Premade or packaged taco seasonings. Relishes. Regular salad dressings. °Where to find more information: °· National Heart, [employer], [employer] Institute: www.nhlbi.nih.gov °· American Heart Association: www.heart.org °Summary °· The DASH eating plan is a healthy eating plan that has been shown to reduce high blood pressure (hypertension). It may also reduce your risk for type 2 diabetes, heart disease, and stroke. °· With the DASH eating plan, you should limit salt (sodium) intake to 2,300 mg a day. If you have hypertension, you may need to reduce your sodium intake to 1,500 mg a day. °· When on the DASH eating plan, aim to eat more fresh fruits and vegetables, whole grains, lean proteins, low-fat dairy, and heart-healthy fats. °· Work with your health care provider or diet and nutrition specialist (dietitian) to adjust your eating plan to your individual calorie needs. °This information is not intended to replace advice given to you by your  health care provider. Make sure you discuss any questions you have with your health care provider. °Document Revised: 10/17/2017 Document Reviewed: 10/28/2016 °[employer]vier Patient Education © 2020 [employer] ° °

## 2020-09-21 NOTE — Progress Notes (Signed)
Established Patient Office Visit  Subjective:  Patient ID: Barbara Petersen, female    DOB: [birth year]  Age: 49 y.o. MRN: [id number]  CC: No chief complaint on file.   HPI Barbara Petersen is a 49 y.o female who presented for follow up of hypertension and lab review. She reports being compliant with her medication and treatment regimen. She verbalized not checking her blood pressure regularly and states she is working on doing better. She reports smoking about 3-4 cigarettes per day and verbalized intent to quit. Her recent Lab done 09/06/2020 showed a HgbA1C of 6%. Her lipid panel showed Triglycerides of 187 mg/dL, HDL 39 mg/dL, LDL 149 mg/dL, and a Chol/HDL ratio of 5. She denies fever, chills, cough, shortness of breath or dyspnea with exertion.Denies numbness, tingling or peripheral neuropathy. Overall, she states that she is doing well and offers no additional complaint at this time.  Past Medical History:  Diagnosis Date  . Arthritis    right ankle  . Complication of anesthesia 2013   seizure during appendectomy  . GERD (gastroesophageal reflux disease)    occ no meds  . Headache    migraines  . History of methicillin resistant staphylococcus aureus (MRSA) 2010  . Hypertension    been off bp meds due to no insurance 5-6 months  . Seizures (HCC)    last seizure was in May 2020    Past Surgical History:  Procedure Laterality Date  . APPENDECTOMY    . CHOLECYSTECTOMY    . HARDWARE REMOVAL Right 05/13/2019   Procedure: HARDWARE REMOVAL POSTERIOR RIGHT ANKLE;  Surgeon: Kennedy Bucker, MD;  Location: [facility] ORS;  Service: Orthopedics;  Laterality: Right;  . ORIF ANKLE FRACTURE Right 11/10/2018   Procedure: OPEN REDUCTION INTERNAL FIXATION (ORIF) ANKLE FRACTURE;  Surgeon: Kennedy Bucker, MD;  Location: [facility] OR[facility];  Service: Orthopedics;  Laterality: Right;  . TUBAL LIGATION      No family history on file.  Social History   Socioeconomic History  . Marital status: Single    Spouse name:  Not on file  . Number of children: Not on file  . Years of education: Not on file  . Highest education level: Not on file  Occupational History  . Not on file  Tobacco Use  . Smoking status: Current Every Day Smoker    Packs/day: 0.25    Years: 15.00    Pack years: 3.75    Types: Cigarettes  . Smokeless tobacco: Never Used  Vaping Use  . Vaping Use: Never used  Substance and Sexual Activity  . Alcohol use: Yes    Comment: monthly-4-5 drinks  . Drug use: No  . Sexual activity: Not on file  Other Topics Concern  . Not on file  Social History Narrative  . Not on file   Social Determinants of Health   Financial Resource Strain: High Risk  . Difficulty of Paying Living Expenses: Very hard  Food Insecurity: No Food Insecurity  . Worried About Programme researcher, broadcasting/film/video in the Last Year: Never true  . Ran Out of Food in the Last Year: Never true  Transportation Needs: No Transportation Needs  . Lack of Transportation (Medical): No  . Lack of Transportation (Non-Medical): No  Physical Activity: Insufficiently Active  . Days of Exercise per Week: 3 days  . Minutes of Exercise per Session: 10 min  Stress: No Stress Concern Present  . Feeling of Stress : Not at all  Social Connections: Socially Isolated  . Frequency of Communication  with Friends and Family: More than three times a week  . Frequency of Social Gatherings with Friends and Family: Once a week  . Attends Religious Services: Never  . Active Member of Clubs or Organizations: No  . Attends Banker Meetings: Never  . Marital Status: Never married  Intimate Partner Violence:   . Fear of Current or Ex-Partner: Not on file  . Emotionally Abused: Not on file  . Physically Abused: Not on file  . Sexually Abused: Not on file    Outpatient Medications Prior to Visit  Medication Sig Dispense Refill  . hydrochlorothiazide (HYDRODIURIL) 25 MG tablet Take 1 tablet (25 mg total) by mouth daily. 30 tablet 0  .  lisinopril (ZESTRIL) 10 MG tablet Take 1 tablet (10 mg total) by mouth daily. 30 tablet 0   No facility-administered medications prior to visit.    No Known Allergies  ROS Review of Systems  Constitutional: Negative.   HENT: Negative.   Eyes: Negative.   Respiratory: Negative.   Cardiovascular: Negative.   Gastrointestinal: Negative.   Endocrine: Negative.   Genitourinary: Negative.   Musculoskeletal: Negative.   Skin: Negative.       Objective:    Physical Exam Constitutional:      Appearance: She is obese.  HENT:     Head: Normocephalic and atraumatic.     Nose: Nose normal.  Cardiovascular:     Rate and Rhythm: Normal rate and regular rhythm.     Pulses: Normal pulses.     Heart sounds: Normal heart sounds.  Pulmonary:     Effort: Pulmonary effort is normal.     Breath sounds: Normal breath sounds.  Abdominal:     General: Bowel sounds are normal.     There were no vitals taken for this visit. Wt Readings from Last 3 Encounters:  08/22/20 259 lb 11.2 oz (117.8 kg)  11/20/19 250 lb (113.4 kg)  05/13/19 250 lb (113.4 kg)   She was encouraged to continue with her weight loss regimen.  Health Maintenance Due  Topic Date Due  . COVID-19 Vaccine (1) Never done  . TETANUS/TDAP  Never done  . PAP SMEAR-Modifier  Never done    There are no preventive care reminders to display for this patient.  Lab Results  Component Value Date   TSH 1.580 09/06/2020   Lab Results  Component Value Date   WBC 6.3 09/06/2020   HGB 13.6 09/06/2020   HCT 41.8 09/06/2020   MCV 86 09/06/2020   PLT 336 09/06/2020   Lab Results  Component Value Date   NA 138 09/06/2020   K 4.3 09/06/2020   CO2 25 09/06/2020   GLUCOSE 115 (H) 09/06/2020   BUN 14 09/06/2020   CREATININE 0.93 09/06/2020   BILITOT <0.2 09/06/2020   ALKPHOS 71 09/06/2020   AST 14 09/06/2020   ALT 14 09/06/2020   PROT 7.6 09/06/2020   ALBUMIN 4.7 09/06/2020   CALCIUM 9.6 09/06/2020   ANIONGAP 12  11/20/2019   Lab Results  Component Value Date   CHOL 196 09/06/2020   Lab Results  Component Value Date   HDL 39 (L) 09/06/2020   Lab Results  Component Value Date   LDLCALC 124 (H) 09/06/2020   Lab Results  Component Value Date   TRIG 187 (H) 09/06/2020   Lab Results  Component Value Date   CHOLHDL 5.0 (H) 09/06/2020   Lab Results  Component Value Date   HGBA1C 6.0 (H) 09/06/2020  Assessment & Plan:   1. Essential hypertension Her blood pressure is not under control. Her goal should be less than 140/90 mmHg. Her Lisinopril was increased from 10 to 20 mg. - lisinopril (ZESTRIL) 20 MG tablet; Take 1 tablet (20 mg total) by mouth daily.  Dispense: 30 tablet; Refill: 0 She was advised to check her blood pressure BID, and bring log to next appointment  She encouraged to continue DASH diet and exercise as tolerated   2. Prediabetes Her HgbA1C was 6%. Her goal should be <5.6%. She would start Metformin  - metFORMIN (GLUCOPHAGE) 1000 MG tablet; Take 0.5 tablets (500 mg total) by mouth daily with breakfast.  Dispense: 30 tablet; Refill: 0 She was educated on the side effects of medication and was informed to contact clinic. She was encouraged to continue on low carb/non concentrated sweet diet  3. Elevated lipids Her ASCVD risk was 27.4% she would be started on Statin - pravastatin (PRAVACHOL) 10 MG tablet; Take 1 tablet (10 mg total) by mouth daily.  Dispense: 30 tablet; Refill: 0 She was educated on the side effects of medication and inform to contact clinic. She was advised to continue on low fat/cholesterol diet  4. Smoking She was strongly advised to quit smoking and was provided with information on smoking cessation ([facility]).   Follow-up: on 10/19/2020 or sooner if symptoms worsens or fail to improve.   Onnie Graham, RN

## 2020-09-29 ENCOUNTER — Other Ambulatory Visit: Payer: Self-pay

## 2020-09-29 ENCOUNTER — Ambulatory Visit: Payer: Self-pay | Admitting: Pharmacy Technician

## 2020-09-29 DIAGNOSIS — Z79899 Other long term (current) drug therapy: Secondary | ICD-10-CM

## 2020-09-29 NOTE — Progress Notes (Signed)
Provided patient with financial assistance application for [facility] due to recent recent visit.  Patient agreed to be responsible for gathering financial information and forwarding to appropriate department in [facility].    Completed [facility] application and contract.  Patient agreed to all terms of the [facility] contract.    Patient approved to receive medication assistance at [facility] until time for re-certification in 4471, and as long as eligibility criteria continues to be met.   Provided patient with community resource material based on her particular needs.    [facility]

## 2020-10-05 ENCOUNTER — Other Ambulatory Visit: Payer: Self-pay

## 2020-10-05 ENCOUNTER — Other Ambulatory Visit: Payer: Self-pay | Admitting: Gerontology

## 2020-10-05 DIAGNOSIS — I10 Essential (primary) hypertension: Secondary | ICD-10-CM

## 2020-10-06 ENCOUNTER — Encounter: Payer: Self-pay | Admitting: General Practice

## 2020-10-10 ENCOUNTER — Ambulatory Visit: Payer: Self-pay | Admitting: Specialist

## 2020-10-10 ENCOUNTER — Other Ambulatory Visit: Payer: Self-pay

## 2020-10-10 DIAGNOSIS — G8929 Other chronic pain: Secondary | ICD-10-CM

## 2020-10-10 NOTE — Progress Notes (Signed)
  Subjective:     Patient ID: Barbara Petersen, female   DOB: 04-24-1971, 49 y.o.   MRN: [id number]     The X rays do not look as bad as I feared. The Radiologist recommended an MRI and it will be ordered.     Objective:   Physical Exam     Assessment:         Plan:     R/O internal derangement per Radiologist recommendation

## 2020-10-19 ENCOUNTER — Other Ambulatory Visit: Payer: Self-pay

## 2020-10-19 ENCOUNTER — Encounter: Payer: Self-pay | Admitting: Gerontology

## 2020-10-19 ENCOUNTER — Ambulatory Visit: Payer: Self-pay | Admitting: Gerontology

## 2020-10-19 ENCOUNTER — Other Ambulatory Visit: Payer: Self-pay | Admitting: Gerontology

## 2020-10-19 VITALS — BP 148/96 | HR 76 | Temp 97.8°F | Resp 16 | Ht 64.0 in | Wt 253.8 lb

## 2020-10-19 DIAGNOSIS — E785 Hyperlipidemia, unspecified: Secondary | ICD-10-CM

## 2020-10-19 DIAGNOSIS — R7303 Prediabetes: Secondary | ICD-10-CM

## 2020-10-19 DIAGNOSIS — I10 Essential (primary) hypertension: Secondary | ICD-10-CM

## 2020-10-19 MED ORDER — PRAVASTATIN SODIUM 10 MG PO TABS: 10.0000 mg | ORAL_TABLET | Freq: Every day | ORAL | 2 refills | Status: DC

## 2020-10-19 MED ORDER — LISINOPRIL 20 MG PO TABS: 20.0000 mg | ORAL_TABLET | Freq: Every day | ORAL | 2 refills | Status: DC

## 2020-10-19 MED ORDER — METFORMIN HCL 1000 MG PO TABS: 500.0000 mg | ORAL_TABLET | Freq: Every day | ORAL | 2 refills | Status: DC

## 2020-10-19 MED ORDER — HYDROCHLOROTHIAZIDE 25 MG PO TABS: 25.0000 mg | ORAL_TABLET | Freq: Every day | ORAL | 2 refills | Status: DC

## 2020-10-19 NOTE — Patient Instructions (Addendum)
DASH Eating Plan DASH stands for "Dietary Approaches to Stop Hypertension." The DASH eating plan is a healthy eating plan that has been shown to reduce high blood pressure (hypertension). It may also reduce your risk for type 2 diabetes, heart disease, and stroke. The DASH eating plan may also help with weight loss. What are tips for following this plan?  General guidelines  Avoid eating more than 2,300 mg (milligrams) of salt (sodium) a day. If you have hypertension, you may need to reduce your sodium intake to 1,500 mg a day.  Limit alcohol intake to no more than 1 drink a day for nonpregnant women and 2 drinks a day for men. One drink equals 12 oz of beer, 5 oz of wine, or 1 oz of hard liquor.  Work with your health care provider to maintain a healthy body weight or to lose weight. Ask what an ideal weight is for you.  Get at least 30 minutes of exercise that causes your heart to beat faster (aerobic exercise) most days of the week. Activities may include walking, swimming, or biking.  Work with your health care provider or diet and nutrition specialist (dietitian) to adjust your eating plan to your individual calorie needs. Reading food labels   Check food labels for the amount of sodium per serving. Choose foods with less than 5 percent of the Daily Value of sodium. Generally, foods with less than 300 mg of sodium per serving fit into this eating plan.  To find whole grains, look for the word "whole" as the first word in the ingredient list. Shopping  Buy products labeled as "low-sodium" or "no salt added."  Buy fresh foods. Avoid canned foods and premade or frozen meals. Cooking  Avoid adding salt when cooking. Use salt-free seasonings or herbs instead of table salt or sea salt. Check with your health care provider or pharmacist before using salt substitutes.  Do not fry foods. Cook foods using healthy methods such as baking, boiling, grilling, and broiling instead.  Cook with  heart-healthy oils, such as olive, canola, soybean, or sunflower oil. Meal planning  Eat a balanced diet that includes: ? 5 or more servings of fruits and vegetables each day. At each meal, try to fill half of your plate with fruits and vegetables. ? Up to 6-8 servings of whole grains each day. ? Less than 6 oz of lean meat, poultry, or fish each day. A 3-oz serving of meat is about the same size as a deck of cards. One egg equals 1 oz. ? 2 servings of low-fat dairy each day. ? A serving of nuts, seeds, or beans 5 times each week. ? Heart-healthy fats. Healthy fats called Omega-3 fatty acids are found in foods such as flaxseeds and coldwater fish, like sardines, salmon, and mackerel.  Limit how much you eat of the following: ? Canned or prepackaged foods. ? Food that is high in trans fat, such as fried foods. ? Food that is high in saturated fat, such as fatty meat. ? Sweets, desserts, sugary drinks, and other foods with added sugar. ? Full-fat dairy products.  Do not salt foods before eating.  Try to eat at least 2 vegetarian meals each week.  Eat more home-cooked food and less restaurant, buffet, and fast food.  When eating at a restaurant, ask that your food be prepared with less salt or no salt, if possible. What foods are recommended? The items listed may not be a complete list. Talk with your dietitian about   what dietary choices are best for you. Grains Whole-grain or whole-wheat bread. Whole-grain or whole-wheat pasta. Brown rice. Oatmeal. Quinoa. Bulgur. Whole-grain and low-sodium cereals. Pita bread. Low-fat, low-sodium crackers. Whole-wheat flour tortillas. Vegetables Fresh or frozen vegetables (raw, steamed, roasted, or grilled). Low-sodium or reduced-sodium tomato and vegetable juice. Low-sodium or reduced-sodium tomato sauce and tomato paste. Low-sodium or reduced-sodium canned vegetables. Fruits All fresh, dried, or frozen fruit. Canned fruit in natural juice (without  added sugar). Meat and other protein foods Skinless chicken or turkey. Ground chicken or turkey. Pork with fat trimmed off. Fish and seafood. Egg whites. Dried beans, peas, or lentils. Unsalted nuts, nut butters, and seeds. Unsalted canned beans. Lean cuts of beef with fat trimmed off. Low-sodium, lean deli meat. Dairy Low-fat (1%) or fat-free (skim) milk. Fat-free, low-fat, or reduced-fat cheeses. Nonfat, low-sodium ricotta or cottage cheese. Low-fat or nonfat yogurt. Low-fat, low-sodium cheese. Fats and oils Soft margarine without trans fats. Vegetable oil. Low-fat, reduced-fat, or light mayonnaise and salad dressings (reduced-sodium). Canola, safflower, olive, soybean, and sunflower oils. Avocado. Seasoning and other foods Herbs. Spices. Seasoning mixes without salt. Unsalted popcorn and pretzels. Fat-free sweets. What foods are not recommended? The items listed may not be a complete list. Talk with your dietitian about what dietary choices are best for you. Grains Baked goods made with fat, such as croissants, muffins, or some breads. Dry pasta or rice meal packs. Vegetables Creamed or fried vegetables. Vegetables in a cheese sauce. Regular canned vegetables (not low-sodium or reduced-sodium). Regular canned tomato sauce and paste (not low-sodium or reduced-sodium). Regular tomato and vegetable juice (not low-sodium or reduced-sodium). Pickles. Olives. Fruits Canned fruit in a light or heavy syrup. Fried fruit. Fruit in cream or butter sauce. Meat and other protein foods Fatty cuts of meat. Ribs. Fried meat. Bacon. Sausage. Bologna and other processed lunch meats. Salami. Fatback. Hotdogs. Bratwurst. Salted nuts and seeds. Canned beans with added salt. Canned or smoked fish. Whole eggs or egg yolks. Chicken or turkey with skin. Dairy Whole or 2% milk, cream, and half-and-half. Whole or full-fat cream cheese. Whole-fat or sweetened yogurt. Full-fat cheese. Nondairy creamers. Whipped toppings.  Processed cheese and cheese spreads. Fats and oils Butter. Stick margarine. Lard. Shortening. Ghee. Bacon fat. Tropical oils, such as coconut, palm kernel, or palm oil. Seasoning and other foods Salted popcorn and pretzels. Onion salt, garlic salt, seasoned salt, table salt, and sea salt. Worcestershire sauce. Tartar sauce. Barbecue sauce. Teriyaki sauce. Soy sauce, including reduced-sodium. Steak sauce. Canned and packaged gravies. Fish sauce. Oyster sauce. Cocktail sauce. Horseradish that you find on the shelf. Ketchup. Mustard. Meat flavorings and tenderizers. Bouillon cubes. Hot sauce and Tabasco sauce. Premade or packaged marinades. Premade or packaged taco seasonings. Relishes. Regular salad dressings. Where to find more information:  National Heart, Lung, and Blood Institute: www.nhlbi.nih.gov  American Heart Association: www.heart.org Summary  The DASH eating plan is a healthy eating plan that has been shown to reduce high blood pressure (hypertension). It may also reduce your risk for type 2 diabetes, heart disease, and stroke.  With the DASH eating plan, you should limit salt (sodium) intake to 2,300 mg a day. If you have hypertension, you may need to reduce your sodium intake to 1,500 mg a day.  When on the DASH eating plan, aim to eat more fresh fruits and vegetables, whole grains, lean proteins, low-fat dairy, and heart-healthy fats.  Work with your health care provider or diet and nutrition specialist (dietitian) to adjust your eating plan to your   individual calorie needs. This information is not intended to replace advice given to you by your health care provider. Make sure you discuss any questions you have with your health care provider. Document Revised: 10/17/2017 Document Reviewed: 10/28/2016 Elsevier Patient Education  2020 [employer]  Prediabetes Eating Plan Prediabetes is a condition that causes blood sugar (glucose) levels to be higher than normal. This increases  the risk for developing diabetes. In order to prevent diabetes from developing, your health care provider may recommend a diet and other lifestyle changes to help you:  Control your blood glucose levels.  Improve your cholesterol levels.  Manage your blood pressure. Your health care provider may recommend working with a diet and nutrition specialist (dietitian) to make a meal plan that is best for you. What are tips for following this plan? Lifestyle  Set weight loss goals with the help of your health care team. It is recommended that most people with prediabetes lose 7% of their current body weight.  Exercise for at least 30 minutes at least 5 days a week.  Attend a support group or seek ongoing support from a mental health counselor.  Take over-the-counter and prescription medicines only as told by your health care provider. Reading food labels  Read food labels to check the amount of fat, salt (sodium), and sugar in prepackaged foods. Avoid foods that have: ? Saturated fats. ? Trans fats. ? Added sugars.  Avoid foods that have more than 300 milligrams (mg) of sodium per serving. Limit your daily sodium intake to less than 2,300 mg each day. Shopping  Avoid buying pre-made and processed foods. Cooking  Cook with olive oil. Do not use butter, lard, or ghee.  Bake, broil, grill, or boil foods. Avoid frying. Meal planning   Work with your dietitian to develop an eating plan that is right for you. This may include: ? Tracking how many calories you take in. Use a food diary, notebook, or mobile application to track what you eat at each meal. ? Using the glycemic index (GI) to plan your meals. The index tells you how quickly a food will raise your blood glucose. Choose low-GI foods. These foods take a longer time to raise blood glucose.  Consider following a Mediterranean diet. This diet includes: ? Several servings each day of fresh fruits and vegetables. ? Eating fish at  least twice a week. ? Several servings each day of whole grains, beans, nuts, and seeds. ? Using olive oil instead of other fats. ? Moderate alcohol consumption. ? Eating small amounts of red meat and whole-fat dairy.  If you have high blood pressure, you may need to limit your sodium intake or follow a diet such as the DASH eating plan. DASH is an eating plan that aims to lower high blood pressure. What foods are recommended? The items listed below may not be a complete list. Talk with your dietitian about what dietary choices are best for you. Grains Whole grains, such as whole-wheat or whole-grain breads, crackers, cereals, and pasta. Unsweetened oatmeal. Bulgur. Barley. Quinoa. Brown rice. Corn or whole-wheat flour tortillas or taco shells. Vegetables Lettuce. Spinach. Peas. Beets. Cauliflower. Cabbage. Broccoli. Carrots. Tomatoes. Squash. Eggplant. Herbs. Peppers. Onions. Cucumbers. Brussels sprouts. Fruits Berries. Bananas. Apples. Oranges. Grapes. Papaya. Mango. Pomegranate. Kiwi. Grapefruit. Cherries. Meats and other protein foods Seafood. Poultry without skin. Lean cuts of pork and beef. Tofu. Eggs. Nuts. Beans. Dairy Low-fat or fat-free dairy products, such as yogurt, cottage cheese, and cheese. Beverages Water. Tea. Coffee.   Sugar-free or diet soda. Seltzer water. Lowfat or no-fat milk. Milk alternatives, such as soy or almond milk. Fats and oils Olive oil. Canola oil. Sunflower oil. Grapeseed oil. Avocado. Walnuts. Sweets and desserts Sugar-free or low-fat pudding. Sugar-free or low-fat ice cream and other frozen treats. Seasoning and other foods Herbs. Sodium-free spices. Mustard. Relish. Low-fat, low-sugar ketchup. Low-fat, low-sugar barbecue sauce. Low-fat or fat-free mayonnaise. What foods are not recommended? The items listed below may not be a complete list. Talk with your dietitian about what dietary choices are best for you. Grains Refined white flour and flour  products, such as bread, pasta, snack foods, and cereals. Vegetables Canned vegetables. Frozen vegetables with butter or cream sauce. Fruits Fruits canned with syrup. Meats and other protein foods Fatty cuts of meat. Poultry with skin. Breaded or fried meat. Processed meats. Dairy Full-fat yogurt, cheese, or milk. Beverages Sweetened drinks, such as sweet iced tea and soda. Fats and oils Butter. Lard. Ghee. Sweets and desserts Baked goods, such as cake, cupcakes, pastries, cookies, and cheesecake. Seasoning and other foods Spice mixes with added salt. Ketchup. Barbecue sauce. Mayonnaise. Summary  To prevent diabetes from developing, you may need to make diet and other lifestyle changes to help control blood sugar, improve cholesterol levels, and manage your blood pressure.  Set weight loss goals with the help of your health care team. It is recommended that most people with prediabetes lose 7 percent of their current body weight.  Consider following a Mediterranean diet that includes plenty of fresh fruits and vegetables, whole grains, beans, nuts, seeds, fish, lean meat, low-fat dairy, and healthy oils. This information is not intended to replace advice given to you by your health care provider. Make sure you discuss any questions you have with your health care provider. Document Revised: 02/26/2019 Document Reviewed: 01/08/2017 Elsevier Patient Education  2020 [employer].  Preventing High Cholesterol Cholesterol is a white, waxy substance similar to fat that the human body needs to help build cells. The liver makes all the cholesterol that a person's body needs. Having high cholesterol (hypercholesterolemia) increases a person's risk for heart disease and stroke. Extra (excess) cholesterol comes from the food the person eats. High cholesterol can often be prevented with diet and lifestyle changes. If you already have high cholesterol, you can control it with diet and lifestyle  changes and with medicine. How can high cholesterol affect me? If you have high cholesterol, deposits (plaques) may build up on the walls of your arteries. The arteries are the blood vessels that carry blood away from your heart. Plaques make the arteries narrower and stiffer. This can limit or block blood flow and cause blood clots to form. Blood clots:  Are tiny balls of cells that form in your blood.  Can move to the heart or brain, causing a heart attack or stroke. Plaques in arteries greatly increase your risk for heart attack and stroke.Making diet and lifestyle changes can reduce your risk for these conditions that may threaten your life. What can increase my risk? This condition is more likely to develop in people who:  Eat foods that are high in saturated fat or cholesterol. Saturated fat is mostly found in: ? Foods that contain animal fat, such as red meat and some dairy products. ? Certain fatty foods made from plants, such as tropical oils.  Are overweight.  Are not getting enough exercise.  Have a family history of high cholesterol. What actions can I take to prevent this? Nutrition  Eat less saturated fat.  Avoid trans fats (partially hydrogenated oils). These are often found in margarine and in some baked goods, fried foods, and snacks bought in packages.  Avoid precooked or cured meat, such as sausages or meat loaves.  Avoid foods and drinks that have added sugars.  Eat more fruits, vegetables, and whole grains.  Choose healthy sources of protein, such as fish, poultry, lean cuts of red meat, beans, peas, lentils, and nuts.  Choose healthy sources of fat, such as: ? Nuts. ? Vegetable oils, especially olive oil. ? Fish that have healthy fats (omega-3 fatty acids), such as mackerel or salmon. The items listed above may not be a complete list of recommended foods and beverages. Contact a dietitian for more information. Lifestyle  Lose weight if you are  overweight. Losing 5-10 lb (2.3-4.5 kg) can help prevent or control high cholesterol. It can also lower your risk for diabetes and high blood pressure. Ask your health care provider to help you with a diet and exercise plan to lose weight safely.  Do not use any products that contain nicotine or tobacco, such as cigarettes, e-cigarettes, and chewing tobacco. If you need help quitting, ask your health care provider.  Limit your alcohol intake. ? Do not drink alcohol if:  Your health care provider tells you not to drink.  You are pregnant, may be pregnant, or are planning to become pregnant. ? If you drink alcohol:  Limit how much you use to:  0-1 drink a day for women.  0-2 drinks a day for men.  Be aware of how much alcohol is in your drink. In the [place] one drink equals one 12 oz bottle of beer (355 mL), one 5 oz glass of wine (148 mL), or one 1 oz glass of hard liquor (44 mL). Activity   Get enough exercise. Each week, do at least 150 minutes of exercise that takes a medium level of effort (moderate-intensity exercise). ? This is exercise that:  Makes your heart beat faster and makes you breathe harder than usual.  Allows you to still be able to talk. ? You could exercise in short sessions several times a day or longer sessions a few times a week. For example, on 5 days each week, you could walk fast or ride your bike 3 times a day for 10 minutes each time.  Do exercises as told by your health care provider. Medicines  In addition to diet and lifestyle changes, your health care provider may recommend medicines to help lower cholesterol. This may be a medicine to lower the amount of cholesterol your liver makes. You may need medicine if: ? Diet and lifestyle changes do not lower your cholesterol enough. ? You have high cholesterol and other risk factors for heart disease or stroke.  Take over-the-counter and prescription medicines only as told by your health care  provider. General information  Manage your risk factors for high cholesterol. Talk with your health care provider about all your risk factors and how to lower your risk.  Manage other conditions that you have, such as diabetes or high blood pressure (hypertension).  Have blood tests to check your cholesterol levels at regular points in time as told by your health care provider.  Keep all follow-up visits as told by your health care provider. This is important. Where to find more information  American Heart Association: www.heart.org  National Heart, Lung, and Blood Institute: [employer].is Summary  High cholesterol increases your risk for heart disease  and stroke. By keeping your cholesterol level low, you can reduce your risk for these conditions.  High cholesterol can often be prevented with diet and lifestyle changes.  Work with your health care provider to manage your risk factors, and have your blood tested regularly. This information is not intended to replace advice given to you by your health care provider. Make sure you discuss any questions you have with your health care provider. Document Revised: 02/26/2019 Document Reviewed: 07/13/2016 Elsevier Patient Education  2020 [employer].

## 2020-10-19 NOTE — Progress Notes (Signed)
Established Patient Office Visit  Subjective:  Patient ID: Barbara Petersen, female    DOB: 1970/12/27  Age: 49 y.o. MRN: [id number]  CC: No chief complaint on file.   HPI Barbara Petersen is a 27 y. o who presents for follow up of hypertension and medication refill. She verbalized being compliant with her medication and treatment regimen. She verbalized checking her blood pressure BID. She states that her blood pressure range between 116/70 mmHg to 180/88 mmHg. She continues to work on positive lifestyle modification and states that she has been watching her diet. She reports not eating any fired foods and states that her food is normally baked. She verbalized  Ongoing pain to her right ankle. Pain is described as intermittent stabbing pain localized to her right ankle. Pain is worse with ambulation. Elevating the extremity and applying cold and warm compress helps the pain. She was seen by Dr. Justice Rocher on 10/10/2020 during which MRI of the right ankle was ordered. MRI has been scheduled for 10/26/2020. She denies fever, chest pain, or dyspnea with exertion.Overall, she states that she is doing well and offers no further complaint.  Past Medical History:  Diagnosis Date   Arthritis    right ankle   Complication of anesthesia 2013   seizure during appendectomy   GERD (gastroesophageal reflux disease)    occ no meds   Headache    migraines   History of methicillin resistant staphylococcus aureus (MRSA) 2010   Hypertension    been off bp meds due to no insurance 5-6 months   Seizures (HCC)    last seizure was in May 2020    Past Surgical History:  Procedure Laterality Date   APPENDECTOMY     CHOLECYSTECTOMY     HARDWARE REMOVAL Right 05/13/2019   Procedure: HARDWARE REMOVAL POSTERIOR RIGHT ANKLE;  Surgeon: Kennedy Bucker, MD;  Location: [facility];  Service: Orthopedics;  Laterality: Right;   ORIF ANKLE FRACTURE Right 11/10/2018   Procedure: OPEN REDUCTION INTERNAL FIXATION (ORIF)  ANKLE FRACTURE;  Surgeon: Kennedy Bucker, MD;  Location: [facility];  Service: Orthopedics;  Laterality: Right;   TUBAL LIGATION      No family history on file.  Social History   Socioeconomic History   Marital status: Single    Spouse name: Not on file   Number of children: Not on file   Years of education: Not on file   Highest education level: Not on file  Occupational History   Not on file  Tobacco Use   Smoking status: Current Every Day Smoker    Packs/day: 0.25    Years: 15.00    Pack years: 3.75    Types: Cigarettes   Smokeless tobacco: Never Used  Vaping Use   Vaping Use: Never used  Substance and Sexual Activity   Alcohol use: Yes    Comment: monthly-4-5 drinks   Drug use: No   Sexual activity: Not on file  Other Topics Concern   Not on file  Social History Narrative   Not on file   Social Determinants of Health   Financial Resource Strain: High Risk   Difficulty of Paying Living Expenses: Very hard  Food Insecurity: No Food Insecurity   Worried About Programme researcher, broadcasting/film/video in the Last Year: Never true   Ran Out of Food in the Last Year: Never true  Transportation Needs: No Transportation Needs   Lack of Transportation (Medical): No   Lack of Transportation (Non-Medical): No  Physical Activity: Insufficiently Active  Days of Exercise per Week: 3 days   Minutes of Exercise per Session: 10 min  Stress: No Stress Concern Present   Feeling of Stress : Not at all  Social Connections: Socially Isolated   Frequency of Communication with Friends and Family: More than three times a week   Frequency of Social Gatherings with Friends and Family: Once a week   Attends Religious Services: Never   Database administrator or Organizations: No   Attends Engineer, structural: Never   Marital Status: Never married  Catering manager Violence:    Fear of Current or Ex-Partner: Not on file   Emotionally Abused: Not on file   Physically  Abused: Not on file   Sexually Abused: Not on file    Outpatient Medications Prior to Visit  Medication Sig Dispense Refill   hydrochlorothiazide (HYDRODIURIL) 25 MG tablet TAKE ONE TABLET BY MOUTH EVERY DAY 30 tablet 0   lisinopril (ZESTRIL) 20 MG tablet Take 1 tablet (20 mg total) by mouth daily. 30 tablet 0   metFORMIN (GLUCOPHAGE) 1000 MG tablet Take 0.5 tablets (500 mg total) by mouth daily with breakfast. 30 tablet 0   pravastatin (PRAVACHOL) 10 MG tablet Take 1 tablet (10 mg total) by mouth daily. 30 tablet 0   No facility-administered medications prior to visit.    No Known Allergies  ROS Review of Systems  Constitutional: Negative.   HENT: Negative.   Eyes: Negative.   Respiratory: Negative.   Cardiovascular: Negative.   Gastrointestinal: Negative.   Endocrine: Negative.   Genitourinary: Negative.   Musculoskeletal: Positive for myalgias (.Complained of ongoing right ankle pain ).  Skin: Negative.   Neurological: Negative.   Psychiatric/Behavioral: Negative.       Objective:    Physical Exam HENT:     Head: Normocephalic and atraumatic.     Nose: Nose normal.  Eyes:     Pupils: Pupils are equal, round, and reactive to light.  Cardiovascular:     Rate and Rhythm: Normal rate and regular rhythm.     Pulses: Normal pulses.     Heart sounds: Normal heart sounds.  Pulmonary:     Effort: Pulmonary effort is normal.     Breath sounds: Normal breath sounds.  Musculoskeletal:        General: Tenderness (Right ankle pain being followed by Dr. Justice Rocher ) present.     Right lower leg: Edema (.Mild, non-pitting edema to her right ankle) present.       Feet:  Neurological:     General: No focal deficit present.     Mental Status: She is alert.  Psychiatric:        Mood and Affect: Mood normal.        Behavior: Behavior normal.        Thought Content: Thought content normal.        Judgment: Judgment normal.     BP (!) 148/96 (BP Location: Left Arm,  Patient Position: Sitting, Cuff Size: Large)    Pulse 76    Temp 97.8 F (36.6 C)    Resp 16    Ht 5\' 4"  (1.626 m)    Wt 253 lb 12.8 oz (115.1 kg)    SpO2 97%    BMI 43.56 kg/m  Wt Readings from Last 3 Encounters:  10/19/20 253 lb 12.8 oz (115.1 kg)  09/21/20 259 lb 6.4 oz (117.7 kg)  08/22/20 259 lb 11.2 oz (117.8 kg)  She was advised to continue on her  weight loss regimen   Health Maintenance Due  Topic Date Due   COVID-19 Vaccine (1) Never done   TETANUS/TDAP  Never done   PAP SMEAR-Modifier  Never done    There are no preventive care reminders to display for this patient.  Lab Results  Component Value Date   TSH 1.580 09/06/2020   Lab Results  Component Value Date   WBC 6.3 09/06/2020   HGB 13.6 09/06/2020   HCT 41.8 09/06/2020   MCV 86 09/06/2020   PLT 336 09/06/2020   Lab Results  Component Value Date   NA 138 09/06/2020   K 4.3 09/06/2020   CO2 25 09/06/2020   GLUCOSE 115 (H) 09/06/2020   BUN 14 09/06/2020   CREATININE 0.93 09/06/2020   BILITOT <0.2 09/06/2020   ALKPHOS 71 09/06/2020   AST 14 09/06/2020   ALT 14 09/06/2020   PROT 7.6 09/06/2020   ALBUMIN 4.7 09/06/2020   CALCIUM 9.6 09/06/2020   ANIONGAP 12 11/20/2019   Lab Results  Component Value Date   CHOL 196 09/06/2020   Lab Results  Component Value Date   HDL 39 (L) 09/06/2020   Lab Results  Component Value Date   LDLCALC 124 (H) 09/06/2020   Lab Results  Component Value Date   TRIG 187 (H) 09/06/2020   Lab Results  Component Value Date   CHOLHDL 5.0 (H) 09/06/2020   Lab Results  Component Value Date   HGBA1C 6.0 (H) 09/06/2020            Assessment & Plan:  1. Essential hypertension Her blood pressure is not under control. Her goal should be <140/90. She will continue with current treatment regimen. - hydrochlorothiazide (HYDRODIURIL) 25 MG tablet; Take 1 tablet (25 mg total) by mouth daily.  Dispense: 30 tablet; Refill: 2 - lisinopril (ZESTRIL) 20 MG tablet; Take 1  tablet (20 mg total) by mouth daily.  Dispense: 30 tablet; Refill: 2 She was advised to record her blood pressure BID, and bring log to next appointment  She was advised to continue low salt Dash diet and exercise as tolorated  2. Prediabetes Her HgbA1C is 6%. She will continue on current treatment regimen  - metFORMIN (GLUCOPHAGE) 1000 MG tablet; Take 0.5 tablets (500 mg total) by mouth daily with breakfast.  Dispense: 30 tablet; Refill: 2 Continue low fat/concentrated sweet diet and exercise as tolerated    3. Elevated lipids She will continue on current treatment regimen  - pravastatin (PRAVACHOL) 10 MG tablet; Take 1 tablet (10 mg total) by mouth daily.  Dispense: 30 tablet; Refill: 2 Continue low fat/Cholesterol diet  She was encouraged to continue positive lifestyle modifications and exercise as tolerated.  4. Right ankle pain She would continue to follow up with Dr. Justice Rocher She was advised to alternate between cold, and warm compress and elevate the leg as needed.   Follow-up: In 01/17/2021 or sooner if symptom worsen or fail to improve   Onnie Graham, RN

## 2020-10-20 ENCOUNTER — Other Ambulatory Visit: Payer: Self-pay

## 2020-10-23 ENCOUNTER — Ambulatory Visit: Payer: Self-pay | Admitting: Gastroenterology

## 2020-10-23 ENCOUNTER — Encounter: Payer: Self-pay | Admitting: *Deleted

## 2020-10-26 ENCOUNTER — Other Ambulatory Visit: Payer: Self-pay

## 2020-10-26 ENCOUNTER — Ambulatory Visit
Admission: RE | Admit: 2020-10-26 | Discharge: 2020-10-26 | Disposition: A | Discharge status: Home / Self Care | Payer: Self-pay | Source: Ambulatory Visit | Attending: Gerontology | Admitting: Gerontology

## 2020-10-26 DIAGNOSIS — G8929 Other chronic pain: Secondary | ICD-10-CM | POA: Insufficient documentation

## 2020-10-26 DIAGNOSIS — M25571 Pain in right ankle and joints of right foot: Secondary | ICD-10-CM | POA: Insufficient documentation

## 2020-10-26 MED ORDER — GADOBUTROL 1 MMOL/ML IV SOLN
10.0000 mL | Freq: Once | INTRAVENOUS | Status: AC | PRN
  Administered 2020-10-26: 10 mL via INTRAVENOUS

## 2020-11-07 ENCOUNTER — Other Ambulatory Visit: Payer: Self-pay

## 2020-11-07 ENCOUNTER — Ambulatory Visit: Payer: Self-pay | Admitting: Specialist

## 2020-11-07 NOTE — Progress Notes (Deleted)
HPI:  Physical Assessment:  Plan:

## 2020-11-07 NOTE — Progress Notes (Unsigned)
°  Subjective:     Patient ID: Barbara Petersen, female   DOB: [birth year], 49 y.o.   MRN: [id number]  HPI  Her fracture was 2 years ago. She is still able to walk for 20 min.  The mri is showing she is developing post-tramatic degenerative arthritis   Review of Systems     Objective:   Physical Exam She does not have an antalgic gait. She able to walk in her toes but can take a few steps on her heals. With her left knee extended, she has 10 degrees of DL and with the knee flex 20 degrees. On the RT, DF is 0+10. Inversion/eversion are =     Assessment:     ***Post traumatic OA of the RT ankle    Plan:     ***At the present time, I think any treatment will not increase her function. She should return here when and if her symptoms increase and we will pan a referral then.

## 2021-01-05 ENCOUNTER — Other Ambulatory Visit: Payer: Self-pay

## 2021-01-05 ENCOUNTER — Encounter: Payer: Self-pay | Admitting: Emergency Medicine

## 2021-01-05 ENCOUNTER — Emergency Department: Payer: Self-pay

## 2021-01-05 ENCOUNTER — Emergency Department
Admission: EM | Admit: 2021-01-05 | Discharge: 2021-01-05 | Disposition: A | Discharge status: Home / Self Care | Payer: Self-pay | Attending: Student in an Organized Health Care Education/Training Program | Admitting: Student in an Organized Health Care Education/Training Program

## 2021-01-05 DIAGNOSIS — R7303 Prediabetes: Secondary | ICD-10-CM | POA: Insufficient documentation

## 2021-01-05 DIAGNOSIS — K219 Gastro-esophageal reflux disease without esophagitis: Secondary | ICD-10-CM | POA: Insufficient documentation

## 2021-01-05 DIAGNOSIS — R1084 Generalized abdominal pain: Secondary | ICD-10-CM | POA: Insufficient documentation

## 2021-01-05 DIAGNOSIS — Z7984 Long term (current) use of oral hypoglycemic drugs: Secondary | ICD-10-CM | POA: Insufficient documentation

## 2021-01-05 DIAGNOSIS — F17210 Nicotine dependence, cigarettes, uncomplicated: Secondary | ICD-10-CM | POA: Insufficient documentation

## 2021-01-05 DIAGNOSIS — I10 Essential (primary) hypertension: Secondary | ICD-10-CM | POA: Insufficient documentation

## 2021-01-05 DIAGNOSIS — Z79899 Other long term (current) drug therapy: Secondary | ICD-10-CM | POA: Insufficient documentation

## 2021-01-05 LAB — URINALYSIS, COMPLETE (UACMP) WITH MICROSCOPIC
Bilirubin Urine: NEGATIVE
Glucose, UA: NEGATIVE mg/dL
Ketones, ur: NEGATIVE mg/dL
Leukocytes,Ua: NEGATIVE
Nitrite: NEGATIVE
Protein, ur: NEGATIVE mg/dL
Specific Gravity, Urine: 1.017 (ref 1.005–1.030)
pH: 6 (ref 5.0–8.0)

## 2021-01-05 LAB — COMPREHENSIVE METABOLIC PANEL
ALT: 17 U/L (ref 0–44)
AST: 18 U/L (ref 15–41)
Albumin: 4.3 g/dL (ref 3.5–5.0)
Alkaline Phosphatase: 55 U/L (ref 38–126)
Anion gap: 8 (ref 5–15)
BUN: 10 mg/dL (ref 6–20)
CO2: 28 mmol/L (ref 22–32)
Calcium: 9.3 mg/dL (ref 8.9–10.3)
Chloride: 102 mmol/L (ref 98–111)
Creatinine, Ser: 0.75 mg/dL (ref 0.44–1.00)
GFR, Estimated: >60 mL/min (ref >60)
Glucose, Bld: 88 mg/dL (ref 70–99)
Potassium: 3.9 mmol/L (ref 3.5–5.1)
Sodium: 138 mmol/L (ref 135–145)
Total Bilirubin: 0.5 mg/dL (ref 0.3–1.2)
Total Protein: 8.1 g/dL (ref 6.5–8.1)

## 2021-01-05 LAB — CBC
HCT: 38.4 % (ref 36.0–46.0)
Hemoglobin: 12.2 g/dL (ref 12.0–15.0)
MCH: 28.5 pg (ref 26.0–34.0)
MCHC: 31.8 g/dL (ref 30.0–36.0)
MCV: 89.7 fL (ref 80.0–100.0)
Platelets: 294 10*3/uL (ref 150–400)
RBC: 4.28 MIL/uL (ref 3.87–5.11)
RDW: 14.8 % (ref 11.5–15.5)
WBC: 7 10*3/uL (ref 4.0–10.5)
nRBC: 0 % (ref 0.0–0.2)

## 2021-01-05 LAB — LIPASE, BLOOD: Lipase: 37 U/L (ref 11–51)

## 2021-01-05 LAB — POC URINE PREG, ED: Preg Test, Ur: NEGATIVE

## 2021-01-05 MED ORDER — ONDANSETRON HCL 4 MG/2ML IJ SOLN
4.0000 mg | Freq: Once | INTRAMUSCULAR | Status: AC
  Administered 2021-01-05: 4 mg via INTRAVENOUS
  Filled 2021-01-05: qty 2

## 2021-01-05 MED ORDER — MORPHINE SULFATE (PF) 4 MG/ML IV SOLN
4.0000 mg | INTRAVENOUS | Status: DC | PRN
  Administered 2021-01-05: 4 mg via INTRAVENOUS
  Filled 2021-01-05: qty 1

## 2021-01-05 MED ORDER — ONDANSETRON HCL 4 MG PO TABS: 4.0000 mg | ORAL_TABLET | Freq: Three times a day (TID) | ORAL | 0 refills | Status: DC | PRN

## 2021-01-05 MED ORDER — IOHEXOL 300 MG/ML  SOLN
100.0000 mL | Freq: Once | INTRAMUSCULAR | Status: AC | PRN
  Administered 2021-01-05: 100 mL via INTRAVENOUS

## 2021-01-05 MED ORDER — SODIUM CHLORIDE 0.9 % IV BOLUS
500.0000 mL | Freq: Once | INTRAVENOUS | Status: AC
  Administered 2021-01-05: 500 mL via INTRAVENOUS

## 2021-01-05 NOTE — ED Triage Notes (Signed)
Pt to ED via POV stating that she has been having pain in her central abdomen since Wednesday. Pt states that the pain comes every 3-5 minutes. Pt states that it feels like contractions but she denies being pregnant. Pt states that she has had her tubes tied and has an IUD. Pt states that last night the pain was so severe that she was not able to sleep. Pt reports that the pain is some better today but that it is still coming in waves. Pt denies any other symptoms.

## 2021-01-05 NOTE — ED Notes (Signed)
Patient transported to CT 

## 2021-01-05 NOTE — Discharge Instructions (Signed)

## 2021-01-05 NOTE — ED Provider Notes (Signed)
[facility] Emergency Department Provider Note    Event Date/Time   First MD Initiated Contact with Patient 01/05/21 2021     (approximate)  I have reviewed the triage vital signs and the nursing notes.   HISTORY  Chief Complaint Abdominal Pain    HPI Barbara Petersen is a 50 y.o. female presents to the ER for evaluation of generalized crampy abdominal pain that she describes like labor pains that started 2 days ago.  Feels that she is not moving gas.  Has had decreased p.o. intake.  No fevers.  Denies any dysuria.  No discharge.    Past Medical History:  Diagnosis Date  . Arthritis    right ankle  . Complication of anesthesia 2013   seizure during appendectomy  . GERD (gastroesophageal reflux disease)    occ no meds  . Headache    migraines  . History of methicillin resistant staphylococcus aureus (MRSA) 2010  . Hypertension    been off bp meds due to no insurance 5-6 months  . Seizures (HCC)    last seizure was in May 2020   No family history on file. Past Surgical History:  Procedure Laterality Date  . APPENDECTOMY    . CHOLECYSTECTOMY    . HARDWARE REMOVAL Right 05/13/2019   Procedure: HARDWARE REMOVAL POSTERIOR RIGHT ANKLE;  Surgeon: Kennedy Bucker, MD;  Location: [facility] ORS;  Service: Orthopedics;  Laterality: Right;  . ORIF ANKLE FRACTURE Right 11/10/2018   Procedure: OPEN REDUCTION INTERNAL FIXATION (ORIF) ANKLE FRACTURE;  Surgeon: Kennedy Bucker, MD;  Location: [facility] ORS;  Service: Orthopedics;  Laterality: Right;  . TUBAL LIGATION     Patient Active Problem List   Diagnosis Date Noted  . Prediabetes 09/21/2020  . Elevated lipids 09/21/2020  . Smoking 09/21/2020  . Encounter to establish care 08/22/2020  . Essential hypertension 08/22/2020  . Abdominal bloating 08/22/2020  . Ankle pain, right 08/22/2020  . Snoring 08/22/2020  . BMI 40.0-44.9, adult (HCC) 05/26/2019  . Seizure-like activity (HCC) 05/11/2019  . Aphasia 04/28/2018       Prior to Admission medications   Medication Sig Start Date End Date Taking? Authorizing Provider  ondansetron (ZOFRAN) 4 MG tablet Take 1 tablet (4 mg total) by mouth every 8 (eight) hours as needed for nausea or vomiting. 01/05/21  Yes Willy Eddy, MD  hydrochlorothiazide (HYDRODIURIL) 25 MG tablet Take 1 tablet (25 mg total) by mouth daily. 10/19/20   Iloabachie, Chioma E, NP  levETIRAcetam (KEPPRA) 500 MG tablet Take 1 tablet by mouth in the morning and at bedtime. 05/11/19   [provider]  lisinopril (ZESTRIL) 20 MG tablet Take 1 tablet (20 mg total) by mouth daily. 10/19/20   Iloabachie, Chioma E, NP  metFORMIN (GLUCOPHAGE) 1000 MG tablet Take 0.5 tablets (500 mg total) by mouth daily with breakfast. 10/19/20   Iloabachie, Chioma E, NP  pravastatin (PRAVACHOL) 10 MG tablet Take 1 tablet (10 mg total) by mouth daily. 10/19/20   Iloabachie, Chioma E, NP    Allergies Patient has no known allergies.    Social History Social History   Tobacco Use  . Smoking status: Current Every Day Smoker    Packs/day: 0.25    Years: 15.00    Pack years: 3.75    Types: Cigarettes  . Smokeless tobacco: Never Used  Vaping Use  . Vaping Use: Never used  Substance Use Topics  . Alcohol use: Yes    Comment: monthly-4-5 drinks  . Drug use: No  Review of Systems Patient denies headaches, rhinorrhea, blurry vision, numbness, shortness of breath, chest pain, edema, cough, abdominal pain, nausea, vomiting, diarrhea, dysuria, fevers, rashes or hallucinations unless otherwise stated above in HPI. ____________________________________________   PHYSICAL EXAM:  VITAL SIGNS: Vitals:   01/05/21 1818  BP: (!) 168/106  Pulse: 70  Resp: 16  Temp: 98.6 F (37 C)  SpO2: 100%    Constitutional: Alert and oriented.  Eyes: Conjunctivae are normal.  Head: Atraumatic. Nose: No congestion/rhinnorhea. Mouth/Throat: Mucous membranes are moist.   Neck: No stridor. Painless ROM.   Cardiovascular: Normal rate, regular rhythm. Grossly normal heart sounds.  Good peripheral circulation. Respiratory: Normal respiratory effort.  No retractions. Lungs CTAB. Gastrointestinal: Soft and mild generalized pain. No distention. No abdominal bruits. No CVA tenderness. Genitourinary:  Musculoskeletal: No lower extremity tenderness nor edema.  No joint effusions. Neurologic:  Normal speech and language. No gross focal neurologic deficits are appreciated. No facial droop Skin:  Skin is warm, dry and intact. No rash noted. Psychiatric: Mood and affect are normal. Speech and behavior are normal.  ____________________________________________   LABS (all labs ordered are listed, but only abnormal results are displayed)  Results for orders placed or performed during the hospital encounter of 01/05/21 (from the past 24 hour(s))  Lipase, blood     Status: None   Collection Time: 01/05/21  6:21 PM  Result Value Ref Range   Lipase 37 11 - 51 U/L  Comprehensive metabolic panel     Status: None   Collection Time: 01/05/21  6:21 PM  Result Value Ref Range   Sodium 138 135 - 145 mmol/L   Potassium 3.9 3.5 - 5.1 mmol/L   Chloride 102 98 - 111 mmol/L   CO2 28 22 - 32 mmol/L   Glucose, Bld 88 70 - 99 mg/dL   BUN 10 6 - 20 mg/dL   Creatinine, Ser 5.00 0.44 - 1.00 mg/dL   Calcium 9.3 8.9 - 37.0 mg/dL   Total Protein 8.1 6.5 - 8.1 g/dL   Albumin 4.3 3.5 - 5.0 g/dL   AST 18 15 - 41 U/L   ALT 17 0 - 44 U/L   Alkaline Phosphatase 55 38 - 126 U/L   Total Bilirubin 0.5 0.3 - 1.2 mg/dL   GFR, Estimated >48 >88 mL/min   Anion gap 8 5 - 15  CBC     Status: None   Collection Time: 01/05/21  6:21 PM  Result Value Ref Range   WBC 7.0 4.0 - 10.5 K/uL   RBC 4.28 3.87 - 5.11 MIL/uL   Hemoglobin 12.2 12.0 - 15.0 g/dL   HCT 91.6 94.5 - 03.8 %   MCV 89.7 80.0 - 100.0 fL   MCH 28.5 26.0 - 34.0 pg   MCHC 31.8 30.0 - 36.0 g/dL   RDW 88.2 80.0 - 34.9 %   Platelets 294 150 - 400 K/uL   nRBC 0.0 0.0  - 0.2 %  Urinalysis, Complete w Microscopic Urine, Clean Catch     Status: Abnormal   Collection Time: 01/05/21  6:21 PM  Result Value Ref Range   Color, Urine YELLOW (A) YELLOW   APPearance HAZY (A) CLEAR   Specific Gravity, Urine 1.017 1.005 - 1.030   pH 6.0 5.0 - 8.0   Glucose, UA NEGATIVE NEGATIVE mg/dL   Hgb urine dipstick SMALL (A) NEGATIVE   Bilirubin Urine NEGATIVE NEGATIVE   Ketones, ur NEGATIVE NEGATIVE mg/dL   Protein, ur NEGATIVE NEGATIVE mg/dL   Nitrite NEGATIVE  NEGATIVE   Leukocytes,Ua NEGATIVE NEGATIVE   RBC / HPF 0-5 0 - 5 RBC/hpf   WBC, UA 0-5 0 - 5 WBC/hpf   Bacteria, UA RARE (A) NONE SEEN   Squamous Epithelial / LPF 0-5 0 - 5   Mucus PRESENT   POC urine preg, ED     Status: None   Collection Time: 01/05/21  7:01 PM  Result Value Ref Range   Preg Test, Ur Negative Negative   ____________________________________________  EKG ____________________________________________  RADIOLOGY  I personally reviewed all radiographic images ordered to evaluate for the above acute complaints and reviewed radiology reports and findings.  These findings were personally discussed with the patient.  Please see medical record for radiology report.  ____________________________________________   PROCEDURES  Procedure(s) performed:  Procedures    Critical Care performed: no ____________________________________________   INITIAL IMPRESSION / ASSESSMENT AND PLAN / ED COURSE  Pertinent labs & imaging results that were available during my care of the patient were reviewed by me and considered in my medical decision making (see chart for details).   DDX: Enteritis, gastritis, biliary pathology, SBO, mass, appendicitis, hernia  Mellany Dinsmore is a 50 y.o. who presents to the ED with presentation as described above.  Patient uncomfortable appearing afebrile blood work is reassuring.  Exam somewhat limited due to obesity therefore CT imaging ordered to exclude hernia or  obstructive process.  No evidence of appendicitis.  Does have evidence of enteritis.  Patient was given IV narcotic medication with significant improvement in symptoms.  Patient tolerating p.o.  Does appear appropriate for outpatient follow-up.  Discussed strict return precautions.     The patient was evaluated in Emergency Department today for the symptoms described in the history of present illness. He/she was evaluated in the context of the global COVID-19 pandemic, which necessitated consideration that the patient might be at risk for infection with the SARS-CoV-2 virus that causes COVID-19. Institutional protocols and algorithms that pertain to the evaluation of patients at risk for COVID-19 are in a state of rapid change based on information released by regulatory bodies including the CDC and federal and state organizations. These policies and algorithms were followed during the patient's care in the ED.  As part of my medical decision making, I reviewed the following data within the electronic MEDICAL RECORD NUMBER Nursing notes reviewed and incorporated, Labs reviewed, notes from prior ED visits and [place] Controlled Substance Database   ____________________________________________   FINAL CLINICAL IMPRESSION(S) / ED DIAGNOSES  Final diagnoses:  Generalized abdominal pain      NEW MEDICATIONS STARTED DURING THIS VISIT:  New Prescriptions   ONDANSETRON (ZOFRAN) 4 MG TABLET    Take 1 tablet (4 mg total) by mouth every 8 (eight) hours as needed for nausea or vomiting.     Note:  This document was prepared using Dragon voice recognition software and may include unintentional dictation errors.    Willy Eddy, MD 01/05/21 2203

## 2021-01-17 ENCOUNTER — Ambulatory Visit: Payer: Self-pay | Admitting: Gerontology

## 2021-01-18 ENCOUNTER — Telehealth: Payer: Self-pay

## 2021-01-18 NOTE — Telephone Encounter (Signed)
rescheduled 3/2 appt to 3/22 at 11. Left message to make them aware of appt.

## 2021-02-06 ENCOUNTER — Ambulatory Visit: Payer: Self-pay | Admitting: Gerontology

## 2021-02-08 ENCOUNTER — Ambulatory Visit: Payer: Self-pay | Admitting: Gerontology

## 2021-04-03 ENCOUNTER — Encounter: Payer: Self-pay | Admitting: Emergency Medicine

## 2021-04-03 ENCOUNTER — Emergency Department: Payer: Self-pay

## 2021-04-03 ENCOUNTER — Other Ambulatory Visit: Payer: Self-pay

## 2021-04-03 DIAGNOSIS — F17210 Nicotine dependence, cigarettes, uncomplicated: Secondary | ICD-10-CM | POA: Insufficient documentation

## 2021-04-03 DIAGNOSIS — R079 Chest pain, unspecified: Secondary | ICD-10-CM | POA: Insufficient documentation

## 2021-04-03 DIAGNOSIS — R112 Nausea with vomiting, unspecified: Secondary | ICD-10-CM | POA: Insufficient documentation

## 2021-04-03 DIAGNOSIS — I10 Essential (primary) hypertension: Secondary | ICD-10-CM | POA: Insufficient documentation

## 2021-04-03 LAB — CBC WITH DIFFERENTIAL/PLATELET
Abs Immature Granulocytes: 0.01 10*3/uL (ref 0.00–0.07)
Basophils Absolute: 0 10*3/uL (ref 0.0–0.1)
Basophils Relative: 0 %
Eosinophils Absolute: 0.2 10*3/uL (ref 0.0–0.5)
Eosinophils Relative: 3 %
HCT: 39.1 % (ref 36.0–46.0)
Hemoglobin: 13 g/dL (ref 12.0–15.0)
Immature Granulocytes: 0 %
Lymphocytes Relative: 48 %
Lymphs Abs: 3.8 10*3/uL (ref 0.7–4.0)
MCH: 28.8 pg (ref 26.0–34.0)
MCHC: 33.2 g/dL (ref 30.0–36.0)
MCV: 86.5 fL (ref 80.0–100.0)
Monocytes Absolute: 0.5 10*3/uL (ref 0.1–1.0)
Monocytes Relative: 7 %
Neutro Abs: 3.2 10*3/uL (ref 1.7–7.7)
Neutrophils Relative %: 42 %
Platelets: 304 10*3/uL (ref 150–400)
RBC: 4.52 MIL/uL (ref 3.87–5.11)
RDW: 14.5 % (ref 11.5–15.5)
WBC: 7.8 10*3/uL (ref 4.0–10.5)
nRBC: 0 % (ref 0.0–0.2)

## 2021-04-03 NOTE — ED Triage Notes (Signed)
Patient ambulatory to triage with steady gait, without difficulty or distress noted; pt reports left sided CP accomp by dizziness tonight; denies hx of same

## 2021-04-04 ENCOUNTER — Emergency Department
Admission: EM | Admit: 2021-04-04 | Discharge: 2021-04-04 | Disposition: A | Discharge status: Home / Self Care | Payer: Self-pay | Attending: Emergency Medicine | Admitting: Emergency Medicine

## 2021-04-04 DIAGNOSIS — R079 Chest pain, unspecified: Secondary | ICD-10-CM

## 2021-04-04 LAB — COMPREHENSIVE METABOLIC PANEL
ALT: 20 U/L (ref 0–44)
AST: 19 U/L (ref 15–41)
Albumin: 4.1 g/dL (ref 3.5–5.0)
Alkaline Phosphatase: 64 U/L (ref 38–126)
Anion gap: 11 (ref 5–15)
BUN: 14 mg/dL (ref 6–20)
CO2: 23 mmol/L (ref 22–32)
Calcium: 9.2 mg/dL (ref 8.9–10.3)
Chloride: 103 mmol/L (ref 98–111)
Creatinine, Ser: 0.73 mg/dL (ref 0.44–1.00)
GFR, Estimated: >60 mL/min (ref >60)
Glucose, Bld: 102 mg/dL — ABNORMAL HIGH (ref 70–99)
Potassium: 3.9 mmol/L (ref 3.5–5.1)
Sodium: 137 mmol/L (ref 135–145)
Total Bilirubin: 0.4 mg/dL (ref 0.3–1.2)
Total Protein: 7.7 g/dL (ref 6.5–8.1)

## 2021-04-04 LAB — TROPONIN I (HIGH SENSITIVITY)
Troponin I (High Sensitivity): 2 ng/L (ref <18)
Troponin I (High Sensitivity): 3 ng/L (ref <18)

## 2021-04-04 NOTE — Discharge Instructions (Signed)

## 2021-04-04 NOTE — ED Provider Notes (Signed)
[facility] Emergency Department Provider Note  ____________________________________________   Event Date/Time   First MD Initiated Contact with Patient 04/04/21 [phone]     (approximate)  I have reviewed the triage vital signs and the nursing notes.   HISTORY  Chief Complaint Chest Pain    HPI Barbara Petersen is a 50 y.o. female with medical history as listed below who presents for evaluation of acute onset chest pain.  She said that  she had an episode earlier today where she felt nauseated and had some vomiting.  She then went to rest and when she woke up she was having some chest pain.  This was more than 15 hours ago.  It has improved and actually gone away for the last few hours.  She has not experienced anything like this in the past.  No injury of which she is aware.  She has no history of blood clots in the legs of the lungs.  She denies any shortness of breath or cough.  No recent fever, sore throat, abdominal pain, nor dysuria.  She is no longer nauseated.  Nothing in particular made the symptoms better or worse and they were severe at times but have now resolved completely.  She denies any recent unilateral leg pain or swelling, denies recent immobilization including surgeries or long trips.        Past Medical History:  Diagnosis Date  . Arthritis    right ankle  . Complication of anesthesia 2013   seizure during appendectomy  . GERD (gastroesophageal reflux disease)    occ no meds  . Headache    migraines  . History of methicillin resistant staphylococcus aureus (MRSA) 2010  . Hypertension    been off bp meds due to no insurance 5-6 months  . Seizures (HCC)    last seizure was in May 2020    Patient Active Problem List   Diagnosis Date Noted  . Prediabetes 09/21/2020  . Elevated lipids 09/21/2020  . Smoking 09/21/2020  . Encounter to establish care 08/22/2020  . Essential hypertension 08/22/2020  . Abdominal bloating 08/22/2020  .  Ankle pain, right 08/22/2020  . Snoring 08/22/2020  . BMI 40.0-44.9, adult (HCC) 05/26/2019  . Seizure-like activity (HCC) 05/11/2019  . Aphasia 04/28/2018    Past Surgical History:  Procedure Laterality Date  . APPENDECTOMY    . CHOLECYSTECTOMY    . HARDWARE REMOVAL Right 05/13/2019   Procedure: HARDWARE REMOVAL POSTERIOR RIGHT ANKLE;  Surgeon: Kennedy Bucker, MD;  Location: [facility] ORS;  Service: Orthopedics;  Laterality: Right;  . ORIF ANKLE FRACTURE Right 11/10/2018   Procedure: OPEN REDUCTION INTERNAL FIXATION (ORIF) ANKLE FRACTURE;  Surgeon: Kennedy Bucker, MD;  Location: [facility] ORS;  Service: Orthopedics;  Laterality: Right;  . TUBAL LIGATION      Prior to Admission medications   Medication Sig Start Date End Date Taking? Authorizing Provider  hydrochlorothiazide (HYDRODIURIL) 25 MG tablet Take 1 tablet (25 mg total) by mouth daily. 10/19/20   Iloabachie, Chioma E, NP  levETIRAcetam (KEPPRA) 500 MG tablet Take 1 tablet by mouth in the morning and at bedtime. 05/11/19   [provider]  lisinopril (ZESTRIL) 20 MG tablet TAKE ONE TABLET BY MOUTH EVERY DAY 10/19/20 10/19/21  Iloabachie, Chioma E, NP  metFORMIN (GLUCOPHAGE) 1000 MG tablet Take 0.5 tablets (500 mg total) by mouth daily with breakfast. 10/19/20   Iloabachie, Chioma E, NP  metFORMIN (GLUCOPHAGE) 500 MG tablet TAKE ONE TABLET BY MOUTH EVERY DAY WITH BREAKFAST 10/19/20  10/19/21  Iloabachie, Chioma E, NP  ondansetron (ZOFRAN) 4 MG tablet Take 1 tablet (4 mg total) by mouth every 8 (eight) hours as needed for nausea or vomiting. 01/05/21   Willy Eddy, MD  pravastatin (PRAVACHOL) 10 MG tablet Take 1 tablet (10 mg total) by mouth daily. 10/19/20   Iloabachie, Chioma E, NP  pravastatin (PRAVACHOL) 20 MG tablet TAKE 1/2 TABLET (10MG ) BY MOUTH EVERY DAY 10/19/20 03/17/21  Iloabachie, Chioma E, NP    Allergies Patient has no known allergies.  No family history on file.  Social History Social History   Tobacco Use  . Smoking  status: Current Every Day Smoker    Packs/day: 0.25    Years: 15.00    Pack years: 3.75    Types: Cigarettes  . Smokeless tobacco: Never Used  Vaping Use  . Vaping Use: Never used  Substance Use Topics  . Alcohol use: Yes    Comment: monthly-4-5 drinks  . Drug use: No    Review of Systems Constitutional: No fever/chills Eyes: No visual changes. ENT: No sore throat. Cardiovascular: Positive for chest pain. Respiratory: Denies shortness of breath. Gastrointestinal: Positive for some upper abdominal pain with nausea and vomiting, now resolved.  No lower abdominal pain. Genitourinary: Negative for dysuria. Musculoskeletal: Negative for neck pain.  Negative for back pain. Integumentary: Negative for rash. Neurological: Negative for headaches, focal weakness or numbness.   ____________________________________________   PHYSICAL EXAM:  VITAL SIGNS: ED Triage Vitals  Enc Vitals Group     BP 04/03/21 2314 (!) 160/99     Pulse Rate 04/03/21 2314 73     Resp 04/03/21 2314 18     Temp 04/03/21 2314 98.7 F (37.1 C)     Temp Source 04/03/21 2314 Oral     SpO2 04/03/21 2314 97 %     Weight 04/03/21 2307 113.4 kg (250 lb)     Height 04/03/21 2307 1.626 m (5\' 4" )     Head Circumference --      Peak Flow --      Pain Score 04/03/21 2307 5     Pain Loc --      Pain Edu? --      Excl. in GC? --     Constitutional: Alert and oriented.  Eyes: Conjunctivae are normal.  Head: Atraumatic. Nose: No congestion/rhinnorhea. Mouth/Throat: Patient is wearing a mask. Neck: No stridor.  No meningeal signs.   Cardiovascular: Normal rate, regular rhythm. Good peripheral circulation. Respiratory: Normal respiratory effort.  No retractions. Gastrointestinal: Soft and nontender. No distention.  Musculoskeletal: No lower extremity tenderness nor edema. No gross deformities of extremities. Neurologic:  Normal speech and language. No gross focal neurologic deficits are appreciated.  Skin:   Skin is warm, dry and intact. Psychiatric: Mood and affect are normal. Speech and behavior are normal.  ____________________________________________   LABS (all labs ordered are listed, but only abnormal results are displayed)  Labs Reviewed  COMPREHENSIVE METABOLIC PANEL - Abnormal; Notable for the following components:      Result Value   Glucose, Bld 102 (*)    All other components within normal limits  CBC WITH DIFFERENTIAL/PLATELET  TROPONIN I (HIGH SENSITIVITY)  TROPONIN I (HIGH SENSITIVITY)   ____________________________________________  EKG  ED ECG REPORT I, 04/05/21, the attending physician, personally viewed and interpreted this ECG.  Date: 04/03/2021 EKG Time: 23: 11 Rate: 78 Rhythm: normal sinus rhythm QRS Axis: normal Intervals: normal ST/T Wave abnormalities: Inverted T waves in lead III, otherwise unremarkable.  Narrative Interpretation: no definitive evidence of acute ischemia; does not meet STEMI criteria.   ____________________________________________  RADIOLOGY I, Loleta Roseory Evonna Stoltz, personally viewed and evaluated these images (plain radiographs) as part of my medical decision making, as well as reviewing the written report by the radiologist.  ED MD interpretation: No acute abnormality on chest x-ray  Official radiology report(s): DG Chest 2 View  Result Date: 04/03/2021 CLINICAL DATA:  Left-sided chest pain and dizziness. EXAM: CHEST - 2 VIEW COMPARISON:  April 27, 2018 FINDINGS: The heart size and mediastinal contours are within normal limits. Both lungs are clear. Radiopaque surgical clips are seen within the right upper quadrant. The visualized skeletal structures are unremarkable. IMPRESSION: No active cardiopulmonary disease. Electronically Signed   By: Aram Candelahaddeus  Houston M.D.   On: 04/03/2021 23:31    ____________________________________________   PROCEDURES   Procedure(s) performed (including Critical Care):  .1-3 Lead EKG  Interpretation Performed by: Loleta RoseForbach, Lattie Cervi, MD Authorized by: Loleta RoseForbach, Marcio Hoque, MD     Interpretation: normal     ECG rate:  75   ECG rate assessment: normal     Rhythm: sinus rhythm     Ectopy: none     Conduction: normal       ____________________________________________   INITIAL IMPRESSION / MDM / ASSESSMENT AND PLAN / ED COURSE  As part of my medical decision making, I reviewed the following data within the electronic MEDICAL RECORD NUMBER Nursing notes reviewed and incorporated, Labs reviewed , EKG interpreted , Old chart reviewed, Radiograph reviewed  and Notes from prior ED visits   Differential diagnosis includes, but is not limited to, musculoskeletal pain, ACS, PE, pneumonia.  The patient is on the cardiac monitor to evaluate for evidence of arrhythmia and/or significant heart rate changes.  Patient's symptoms have completely resolved.  EKG reassuring with no evidence of ischemia.  I personally reviewed the patient's imaging and agree with the radiologist's interpretation that there are no acute abnormalities on chest x-ray.  Vital signs have been stable over an extended period of time in the emergency department.  2 high-sensitivity troponins are negative.  Patient is PERC negative.  CBC normal, CMP normal.  Low risk for ACS based on HEAR score.  I discussed the patient's symptoms and she agrees with the plan for discharge and outpatient follow-up either with her PCP or with cardiology and I provided cardiology contact information.  I gave strict return precautions should she develop new or worsening symptoms and she understands and agrees with the plan.         ____________________________________________  FINAL CLINICAL IMPRESSION(S) / ED DIAGNOSES  Final diagnoses:  Chest pain, unspecified type     MEDICATIONS GIVEN DURING THIS VISIT:  Medications - No data to display   ED Discharge Orders    None      *Please note:  Vernie MurdersValerie Bobst was evaluated in Emergency  Department on 04/04/2021 for the symptoms described in the history of present illness. She was evaluated in the context of the global COVID-19 pandemic, which necessitated consideration that the patient might be at risk for infection with the SARS-CoV-2 virus that causes COVID-19. Institutional protocols and algorithms that pertain to the evaluation of patients at risk for COVID-19 are in a state of rapid change based on information released by regulatory bodies including the CDC and federal and state organizations. These policies and algorithms were followed during the patient's care in the ED.  Some ED evaluations and interventions may be delayed as a result of limited  staffing during and after the pandemic.*  Note:  This document was prepared using Dragon voice recognition software and may include unintentional dictation errors.   Loleta Rose, MD 04/04/21 [phone]

## 2021-06-07 ENCOUNTER — Other Ambulatory Visit: Payer: Self-pay

## 2021-07-09 ENCOUNTER — Other Ambulatory Visit: Payer: Self-pay

## 2021-11-01 ENCOUNTER — Other Ambulatory Visit: Payer: Self-pay

## 2021-11-03 ENCOUNTER — Emergency Department
Admission: EM | Admit: 2021-11-03 | Discharge: 2021-11-03 | Disposition: A | Discharge status: Home / Self Care | Payer: Self-pay | Attending: Emergency Medicine | Admitting: Emergency Medicine

## 2021-11-03 ENCOUNTER — Emergency Department: Payer: Self-pay

## 2021-11-03 ENCOUNTER — Encounter: Payer: Self-pay | Admitting: Emergency Medicine

## 2021-11-03 ENCOUNTER — Other Ambulatory Visit: Payer: Self-pay

## 2021-11-03 DIAGNOSIS — I10 Essential (primary) hypertension: Secondary | ICD-10-CM | POA: Insufficient documentation

## 2021-11-03 DIAGNOSIS — S93601A Unspecified sprain of right foot, initial encounter: Secondary | ICD-10-CM | POA: Insufficient documentation

## 2021-11-03 DIAGNOSIS — W19XXXA Unspecified fall, initial encounter: Secondary | ICD-10-CM

## 2021-11-03 DIAGNOSIS — F17210 Nicotine dependence, cigarettes, uncomplicated: Secondary | ICD-10-CM | POA: Insufficient documentation

## 2021-11-03 DIAGNOSIS — W010XXA Fall on same level from slipping, tripping and stumbling without subsequent striking against object, initial encounter: Secondary | ICD-10-CM | POA: Insufficient documentation

## 2021-11-03 DIAGNOSIS — Z79899 Other long term (current) drug therapy: Secondary | ICD-10-CM | POA: Insufficient documentation

## 2021-11-03 MED ORDER — HYDROCODONE-ACETAMINOPHEN 5-325 MG PO TABS: 1.0000 | ORAL_TABLET | Freq: Four times a day (QID) | ORAL | 0 refills | Status: DC | PRN

## 2021-11-03 MED ORDER — HYDROCODONE-ACETAMINOPHEN 5-325 MG PO TABS
1.0000 | ORAL_TABLET | Freq: Once | ORAL | Status: AC
  Administered 2021-11-03: 1 via ORAL
  Filled 2021-11-03: qty 1

## 2021-11-03 NOTE — Discharge Instructions (Addendum)
Follow-up with your primary care provider daughter or Dr. Loreta Ave who is on-call for podiatry (foot doctor).  Ice and elevate to help reduce pain and swelling.  Use your crutches when you are up walking.  The Ace wrap can be loosened as needed for swelling, If it feels as if it is getting too tight.  Take pain medication only as needed.  Be aware that this medicine could cause drowsiness and increase your risk for falling.

## 2021-11-03 NOTE — ED Provider Notes (Signed)
[facility] Emergency Department Provider Note  ____________________________________________   Event Date/Time   First MD Initiated Contact with Patient 11/03/21 [phone]     (approximate)  I have reviewed the triage vital signs and the nursing notes.   HISTORY  Chief Complaint Foot Pain and Fall   HPI Barbara Petersen is a 50 y.o. female presents to the ED with complaint of right foot pain.  Patient states that she slipped in the mud last evening and this morning woke up with increased pain and inability to bear weight.  She denies any previous injury to her foot.  She denies any head injury or loss of consciousness during her fall last night.  Currently she rates her pain as a 10/10.         Past Medical History:  Diagnosis Date   Arthritis    right ankle   Complication of anesthesia 2013   seizure during appendectomy   GERD (gastroesophageal reflux disease)    occ no meds   Headache    migraines   History of methicillin resistant staphylococcus aureus (MRSA) 2010   Hypertension    been off bp meds due to no insurance 5-6 months   Seizures (HCC)    last seizure was in May 2020    Patient Active Problem List   Diagnosis Date Noted   Prediabetes 09/21/2020   Elevated lipids 09/21/2020   Smoking 09/21/2020   Encounter to establish care 08/22/2020   Essential hypertension 08/22/2020   Abdominal bloating 08/22/2020   Ankle pain, right 08/22/2020   Snoring 08/22/2020   BMI 40.0-44.9, adult (HCC) 05/26/2019   Seizure-like activity (HCC) 05/11/2019   Aphasia 04/28/2018    Past Surgical History:  Procedure Laterality Date   APPENDECTOMY     CHOLECYSTECTOMY     HARDWARE REMOVAL Right 05/13/2019   Procedure: HARDWARE REMOVAL POSTERIOR RIGHT ANKLE;  Surgeon: Kennedy Bucker, MD;  Location: [facility] ORS;  Service: Orthopedics;  Laterality: Right;   ORIF ANKLE FRACTURE Right 11/10/2018   Procedure: OPEN REDUCTION INTERNAL FIXATION (ORIF) ANKLE FRACTURE;   Surgeon: Kennedy Bucker, MD;  Location: [facility] ORS;  Service: Orthopedics;  Laterality: Right;   TUBAL LIGATION      Prior to Admission medications   Medication Sig Start Date End Date Taking? Authorizing Provider  HYDROcodone-acetaminophen (NORCO/VICODIN) 5-325 MG tablet Take 1 tablet by mouth every 6 (six) hours as needed for moderate pain. 11/03/21 11/03/22 Yes Laelle Bridgett L, PA-C  hydrochlorothiazide (HYDRODIURIL) 25 MG tablet Take 1 tablet (25 mg total) by mouth daily. 10/19/20   Iloabachie, Chioma E, NP  levETIRAcetam (KEPPRA) 500 MG tablet Take 1 tablet by mouth in the morning and at bedtime. 05/11/19   [provider]  lisinopril (ZESTRIL) 20 MG tablet TAKE ONE TABLET BY MOUTH EVERY DAY 10/19/20 10/19/21  Iloabachie, Chioma E, NP  metFORMIN (GLUCOPHAGE) 1000 MG tablet Take 0.5 tablets (500 mg total) by mouth daily with breakfast. 10/19/20   Iloabachie, Chioma E, NP  metFORMIN (GLUCOPHAGE) 500 MG tablet TAKE ONE TABLET BY MOUTH EVERY DAY WITH BREAKFAST 10/19/20 10/19/21  Iloabachie, Chioma E, NP  ondansetron (ZOFRAN) 4 MG tablet Take 1 tablet (4 mg total) by mouth every 8 (eight) hours as needed for nausea or vomiting. 01/05/21   Willy Eddy, MD  pravastatin (PRAVACHOL) 10 MG tablet Take 1 tablet (10 mg total) by mouth daily. 10/19/20   Iloabachie, Chioma E, NP  pravastatin (PRAVACHOL) 20 MG tablet TAKE 1/2 TABLET (10MG ) BY MOUTH EVERY DAY 10/19/20  03/17/21  Iloabachie, Chioma E, NP    Allergies Patient has no known allergies.  No family history on file.  Social History Social History   Tobacco Use   Smoking status: Every Day    Packs/day: 0.25    Years: 15.00    Pack years: 3.75    Types: Cigarettes   Smokeless tobacco: Never  Vaping Use   Vaping Use: Never used  Substance Use Topics   Alcohol use: Yes    Comment: monthly-4-5 drinks   Drug use: No    Review of Systems Constitutional: No fever/chills Eyes: No visual changes. ENT: Negative for  trauma. Cardiovascular: Denies chest pain. Respiratory: Denies shortness of breath. Gastrointestinal: No abdominal pain.  No nausea, no vomiting.  Musculoskeletal: Positive for right foot pain. Skin: Negative for abrasions. Neurological: Negative for  focal weakness or numbness. ____________________________________________   PHYSICAL EXAM:  VITAL SIGNS: ED Triage Vitals  Enc Vitals Group     BP 11/03/21 0940 (!) 148/93     Pulse Rate 11/03/21 0940 81     Resp 11/03/21 0940 20     Temp 11/03/21 0940 98.5 F (36.9 C)     Temp Source 11/03/21 0940 Oral     SpO2 11/03/21 0940 97 %     Weight 11/03/21 0930 249 lb 1.9 oz (113 kg)     Height 11/03/21 0930 5\' 4"  (1.626 m)     Head Circumference --      Peak Flow --      Pain Score 11/03/21 0930 10     Pain Loc --      Pain Edu? --      Excl. in GC? --     Constitutional: Alert and oriented. Well appearing and in no acute distress. Eyes: Conjunctivae are normal. PERRL. EOMI. Head: Atraumatic. Neck: No stridor.   Cardiovascular: Normal rate, regular rhythm. Grossly normal heart sounds.  Good peripheral circulation. Respiratory: Normal respiratory effort.  No retractions. Lungs CTAB. Musculoskeletal: On examination of the right foot there is no gross deformity however there is moderate soft tissue edema and tenderness on palpation.  Skin is intact.  No ecchymosis.  Patient motor sensory function intact.  Pulses present capillary refills less than 3 seconds.  No tenderness is noted on compression of the ankle and she is able to flex and extend without any difficulty. Neurologic:  Normal speech and language. No gross focal neurologic deficits are appreciated. No gait instability. Skin:  Skin is warm, dry and intact.  Psychiatric: Mood and affect are normal. Speech and behavior are normal.  ____________________________________________   LABS (all labs ordered are listed, but only abnormal results are displayed)  Labs Reviewed - No  data to display ____________________________________________   RADIOLOGY I, 11/05/21, personally viewed and evaluated these images (plain radiographs) as part of my medical decision making, as well as reviewing the written report by the radiologist.  Official radiology report(s): DG Foot Complete Right  Result Date: 11/03/2021 CLINICAL DATA:  Right pain post fall earlier today. Pain on dorsum of the foot over the metatarsals. EXAM: RIGHT FOOT COMPLETE - 3+ VIEW COMPARISON:  None. FINDINGS: There is no evidence of fracture or dislocation. There is no evidence of arthropathy or other focal bone abnormality. Soft tissue swelling in the forefoot. IMPRESSION: Negative for fracture or dislocation. Electronically Signed   By: 11/05/2021 M.D.   On: 11/03/2021 10:30    ____________________________________________   PROCEDURES  Procedure(s) performed (including Critical Care):  Procedures  ____________________________________________   INITIAL IMPRESSION / ASSESSMENT AND PLAN / ED COURSE  As part of my medical decision making, I reviewed the following data within the electronic MEDICAL RECORD NUMBER Notes from prior ED visits and [place] Controlled Substance Database  50 year old female presents to the ED with complaint of right foot pain after falling due to mild last evening.  Patient states she had increased pain this morning and is unable to bear weight.  On exam there is moderate soft tissue edema however x-ray is reassuring and patient was made aware that she does not have a fracture.  Patient already has a set of crutches with her.  An Ace wrap and a postop shoe will be applied.  At prescription for Norco was sent to her pharmacy to take as needed for severe pain.  She is to follow-up with her PCP if any continued problems.  She was also encouraged to ice and elevate to help reduce swelling and use her crutches as needed for  weightbearing.   ____________________________________________   FINAL CLINICAL IMPRESSION(S) / ED DIAGNOSES  Final diagnoses:  Sprain of right foot, initial encounter  Fall, initial encounter     ED Discharge Orders          Ordered    HYDROcodone-acetaminophen (NORCO/VICODIN) 5-325 MG tablet  Every 6 hours PRN        11/03/21 1040             Note:  This document was prepared using Dragon voice recognition software and may include unintentional dictation errors.    Tommi Rumps, PA-C 11/03/21 1049    Gilles Chiquito, MD 11/03/21 [phone]

## 2021-11-03 NOTE — ED Triage Notes (Signed)
Pt reports last pm slipped in the mud and hurt her right foot. Pt c/o pain to the top of her right foot and states can't walk on it. Denies hitting head

## 2022-05-11 ENCOUNTER — Emergency Department
Admission: EM | Admit: 2022-05-11 | Discharge: 2022-05-11 | Disposition: A | Discharge status: Home / Self Care | Payer: BC Managed Care – PPO | Attending: Emergency Medicine | Admitting: Emergency Medicine

## 2022-05-11 ENCOUNTER — Other Ambulatory Visit: Payer: Self-pay

## 2022-05-11 ENCOUNTER — Encounter: Payer: Self-pay | Admitting: Emergency Medicine

## 2022-05-11 DIAGNOSIS — M5412 Radiculopathy, cervical region: Secondary | ICD-10-CM | POA: Diagnosis not present

## 2022-05-11 DIAGNOSIS — M79601 Pain in right arm: Secondary | ICD-10-CM

## 2022-05-11 DIAGNOSIS — I10 Essential (primary) hypertension: Secondary | ICD-10-CM | POA: Diagnosis not present

## 2022-05-11 DIAGNOSIS — R030 Elevated blood-pressure reading, without diagnosis of hypertension: Secondary | ICD-10-CM | POA: Insufficient documentation

## 2022-05-11 DIAGNOSIS — M79621 Pain in right upper arm: Secondary | ICD-10-CM | POA: Diagnosis present

## 2022-05-11 MED ORDER — NAPROXEN 500 MG PO TABS: 500.0000 mg | ORAL_TABLET | Freq: Two times a day (BID) | ORAL | 0 refills | Status: DC

## 2022-05-11 MED ORDER — LIDOCAINE 5 % EX PTCH
1.0000 | MEDICATED_PATCH | CUTANEOUS | Status: DC
  Administered 2022-05-11: 1 via TRANSDERMAL
  Filled 2022-05-11: qty 1

## 2022-05-11 MED ORDER — METHOCARBAMOL 500 MG PO TABS: ORAL_TABLET | ORAL | 0 refills | Status: DC

## 2022-05-11 MED ORDER — KETOROLAC TROMETHAMINE 30 MG/ML IJ SOLN
30.0000 mg | Freq: Once | INTRAMUSCULAR | Status: AC
  Administered 2022-05-11: 30 mg via INTRAMUSCULAR
  Filled 2022-05-11: qty 1

## 2022-12-22 ENCOUNTER — Emergency Department: Payer: Self-pay

## 2022-12-22 ENCOUNTER — Emergency Department
Admission: EM | Admit: 2022-12-22 | Discharge: 2022-12-22 | Disposition: A | Discharge status: Home / Self Care | Payer: Self-pay | Attending: Emergency Medicine | Admitting: Emergency Medicine

## 2022-12-22 DIAGNOSIS — R030 Elevated blood-pressure reading, without diagnosis of hypertension: Secondary | ICD-10-CM

## 2022-12-22 DIAGNOSIS — I10 Essential (primary) hypertension: Secondary | ICD-10-CM | POA: Insufficient documentation

## 2022-12-22 DIAGNOSIS — M79602 Pain in left arm: Secondary | ICD-10-CM | POA: Insufficient documentation

## 2022-12-22 DIAGNOSIS — M25512 Pain in left shoulder: Secondary | ICD-10-CM | POA: Insufficient documentation

## 2022-12-22 MED ORDER — LIDOCAINE 5 % EX PTCH
1.0000 | MEDICATED_PATCH | CUTANEOUS | Status: DC
  Administered 2022-12-22: 1 via TRANSDERMAL
  Filled 2022-12-22: qty 1

## 2022-12-22 MED ORDER — LIDOCAINE 5 % EX PTCH: 1.0000 | MEDICATED_PATCH | Freq: Two times a day (BID) | CUTANEOUS | 0 refills | Status: AC

## 2022-12-22 MED ORDER — KETOROLAC TROMETHAMINE 15 MG/ML IJ SOLN
15.0000 mg | Freq: Once | INTRAMUSCULAR | Status: AC
  Administered 2022-12-22: 15 mg via INTRAMUSCULAR
  Filled 2022-12-22: qty 1

## 2022-12-22 MED ORDER — NAPROXEN 500 MG PO TABS: 500.0000 mg | ORAL_TABLET | Freq: Two times a day (BID) | ORAL | 0 refills | Status: AC

## 2022-12-22 MED ORDER — ACETAMINOPHEN 325 MG PO TABS
650.0000 mg | ORAL_TABLET | Freq: Once | ORAL | Status: AC
  Administered 2022-12-22: 650 mg via ORAL
  Filled 2022-12-22: qty 2

## 2022-12-22 NOTE — ED Notes (Signed)
Patient taken to imaging. 

## 2022-12-22 NOTE — Discharge Instructions (Addendum)
Please follow-up with orthopedics.  You may take the medications as prescribed for your pain, though do not take the naproxen with any other NSAIDs.  Please return for any new, worsening, or changing symptoms or other concerns.  Also, remember to follow-up with your primary care provider regarding your blood pressure as it was high in the emergency department.  Return if you develop chest pain, headaches, trouble breathing, back pain, dizziness, or any other concerns.  It was a pleasure caring for you today.

## 2022-12-22 NOTE — ED Triage Notes (Signed)
Pt sts that she has been having pain in her left bicep that goes down to her hand for the last two weeks. Pt had tried OTC meds with no relief.

## 2022-12-22 NOTE — ED Provider Notes (Signed)
[facility] Provider Note    Event Date/Time   First MD Initiated Contact with Patient 12/22/22 1221     (approximate)   History   Arm Pain   HPI  Barbara Petersen is a 52 y.o. female with a past medical history of hyperlipidemia, hypertension, obesity who presents today for evaluation of left arm pain.  Patient reports that this has been ongoing for the past 2 weeks.  She denies any injuries.  She reports the pain is significantly worsened with movement and improved with rest.  She reports that her pain is even worse with reaching overhead.  She denies any specific injuries.  She reports that the pain starts the top of her shoulder and radiates down the lateral aspect of her left upper arm to her elbow.  She denies any weakness in her arm.  No paresthesias.  No neck pain.  She denies chest pain, shortness of breath, or back pain.  She reports that the pain is controlled when she is not moving her arm, and has significant pain when she does move her arm.  Patient Active Problem List   Diagnosis Date Noted   Prediabetes 09/21/2020   Elevated lipids 09/21/2020   Smoking 09/21/2020   Encounter to establish care 08/22/2020   Essential hypertension 08/22/2020   Abdominal bloating 08/22/2020   Ankle pain, right 08/22/2020   Snoring 08/22/2020   BMI 40.0-44.9, adult ([place]) 05/26/2019   Seizure-like activity ([place]) 05/11/2019   Aphasia 04/28/2018          Physical Exam   Triage Vital Signs: ED Triage Vitals  Enc Vitals Group     BP 12/22/22 1202 (!) 208/118     Pulse Rate 12/22/22 1202 98     Resp 12/22/22 1202 18     Temp 12/22/22 1202 98.4 F (36.9 C)     Temp Source 12/22/22 1202 Oral     SpO2 12/22/22 1202 94 %     Weight 12/22/22 1203 255 lb (115.7 kg)     Height --      Head Circumference --      Peak Flow --      Pain Score 12/22/22 1203 10     Pain Loc --      Pain Edu? --      Excl. in [place]? --     Most recent vital signs: Vitals:    12/22/22 1202 12/22/22 1330  BP: (!) 208/118 (!) 178/104  Pulse: 98 84  Resp: 18 18  Temp: 98.4 F (36.9 C) 98.4 F (36.9 C)  SpO2: 94% 97%    Physical Exam Vitals and nursing note reviewed.  Constitutional:      General: Awake and alert. No acute distress.    Appearance: Normal appearance. The patient is obese.  HENT:     Head: Normocephalic and atraumatic.     Mouth: Mucous membranes are moist.  Eyes:     General: PERRL. Normal EOMs        Right eye: No discharge.        Left eye: No discharge.     Conjunctiva/sclera: Conjunctivae normal.  Cardiovascular:     Rate and Rhythm: Normal rate and regular rhythm.     Pulses: Normal pulses. Equal in all four extremities    Heart sounds: Normal heart sounds Pulmonary:     Effort: Pulmonary effort is normal. No respiratory distress.     Breath sounds: Normal breath sounds.  Abdominal:  Abdomen is soft. There is no abdominal tenderness. No rebound or guarding. No distention. Musculoskeletal:        General: No swelling. Normal range of motion.     Cervical back: Normal range of motion and neck supple.  No midline cervical spine tenderness.  Full range of motion of neck.  Negative Spurling test.  Negative Lhermitte sign.  Normal strength and sensation in bilateral upper extremities. Normal grip strength bilaterally.  Normal intrinsic muscle function of the hand bilaterally.  Normal radial pulses bilaterally. Left shoulder: No obvious deformity, swelling, ecchymosis, or erythema No clavicular or AC joint tenderness.  Tenderness along the superior aspect of shoulder, as well as the lateral aspect.  Compartment soft and compressible throughout upper extremity. Able to actively and passively forward flex and abduct at shoulder fully though pain with any active or passive range of motion, negative drop arm test Normal ROM at elbow and wrist Normal resisted pronation and supination 2+ radial pulse Normal grip strength Normal intrinsic  hand muscle function Skin:    General: Skin is warm and dry.     Capillary Refill: Capillary refill takes less than 2 seconds.     Findings: No rash.  Neurological:     Mental Status: The patient is awake and alert.      ED Results / Procedures / Treatments   Labs (all labs ordered are listed, but only abnormal results are displayed) Labs Reviewed - No data to display   EKG     RADIOLOGY I independently reviewed and interpreted imaging and agree with radiologists findings.     PROCEDURES:  Critical Care performed:   Procedures   MEDICATIONS ORDERED IN ED: Medications  lidocaine (LIDODERM) 5 % 1 patch (1 patch Transdermal Patch Applied 12/22/22 1249)  ketorolac (TORADOL) 15 MG/ML injection 15 mg (15 mg Intramuscular Given 12/22/22 1250)  acetaminophen (TYLENOL) tablet 650 mg (650 mg Oral Given 12/22/22 1248)     IMPRESSION / MDM / ASSESSMENT AND PLAN / ED COURSE  I reviewed the triage vital signs and the nursing notes.   Differential diagnosis includes, but is not limited to, calcific tendinitis, rotator cuff injury, cervical radiculopathy, nerve entrapment.  I reviewed the patient's chart.  Patient was seen for the same complaint on right side in June 2023 he was diagnosed with cervical radiculopathy.  Patient presents to the emergency department hypertensive, though reports that she is in 10 out of 10 pain.  Her BP is equal bilaterally, and normal and equal pulses in all 4 extremities. She denies any chest pain, back pain, shortness of breath, or dizziness to suggest vascular catastrophe or cardiac etiology.  Her pain is easily reproducible with any range of motion of her shoulder or palpation of the area.  X-ray reveals degenerative changes without acute osseous findings.  She was treated symptomatically with Toradol, Lidoderm patch, and Tylenol with improvement of her pain.  I recommended close outpatient follow-up with orthopedics and the appropriate follow-up  information was provided.  Patient was noted to have elevated blood pressure, likely tach secondary to pain.  Her blood pressures are equal bilaterally.  I recommended that she follow-up with her outpatient provider for further blood pressure control and we discussed return precautions.  We also discussed return precautions.  Patient understands and agrees with plan.  She was discharged in stable condition.  Patient's presentation is most consistent with acute complicated illness / injury requiring diagnostic workup.     FINAL CLINICAL IMPRESSION(S) / ED DIAGNOSES  Final diagnoses:  Acute pain of left shoulder  Elevated blood pressure reading     Rx / DC Orders   ED Discharge Orders          Ordered    naproxen (NAPROSYN) 500 MG tablet  2 times daily with meals        12/22/22 1331    lidocaine (LIDODERM) 5 %  Every 12 hours        12/22/22 1331             Note:  This document was prepared using Dragon voice recognition software and may include unintentional dictation errors.   Marquette Old, PA-C 12/22/22 1410    Duffy Bruce, MD 12/23/22 [phone]

## 2023-02-26 ENCOUNTER — Emergency Department
Admission: EM | Admit: 2023-02-26 | Discharge: 2023-02-26 | Disposition: A | Discharge status: Home / Self Care | Payer: Medicaid Other | Attending: Emergency Medicine | Admitting: Emergency Medicine

## 2023-02-26 ENCOUNTER — Encounter: Payer: Self-pay | Admitting: Intensive Care

## 2023-02-26 ENCOUNTER — Other Ambulatory Visit: Payer: Self-pay

## 2023-02-26 DIAGNOSIS — M791 Myalgia, unspecified site: Secondary | ICD-10-CM | POA: Insufficient documentation

## 2023-02-26 DIAGNOSIS — I10 Essential (primary) hypertension: Secondary | ICD-10-CM | POA: Insufficient documentation

## 2023-02-26 DIAGNOSIS — Z1152 Encounter for screening for COVID-19: Secondary | ICD-10-CM | POA: Insufficient documentation

## 2023-02-26 DIAGNOSIS — B349 Viral infection, unspecified: Secondary | ICD-10-CM | POA: Insufficient documentation

## 2023-02-26 LAB — RESP PANEL BY RT-PCR (RSV, FLU A&B, COVID)  RVPGX2
Influenza A by PCR: NEGATIVE
Influenza B by PCR: NEGATIVE
Resp Syncytial Virus by PCR: NEGATIVE
SARS Coronavirus 2 by RT PCR: NEGATIVE

## 2023-02-26 MED ORDER — IBUPROFEN 400 MG PO TABS
400.0000 mg | ORAL_TABLET | Freq: Once | ORAL | Status: AC
  Administered 2023-02-26: 400 mg via ORAL
  Filled 2023-02-26: qty 1

## 2023-02-26 MED ORDER — ACETAMINOPHEN 500 MG PO TABS
1000.0000 mg | ORAL_TABLET | Freq: Once | ORAL | Status: AC
  Administered 2023-02-26: 1000 mg via ORAL
  Filled 2023-02-26: qty 2

## 2023-02-26 NOTE — ED Provider Notes (Signed)
[facility] Provider Note    Event Date/Time   First MD Initiated Contact with Patient 02/26/23 [phone]     (approximate)   History   Sore Throat   HPI  Marlenne Poleski is a 52 y.o. female past medical history of seizures, hypertension, migraine headache, GERD who presents because of fever body ache sore throat.  Patient's symptoms started on Monday.  She describes bilateral thigh aching.  Then felt very cold and had fever of 101 at home.  Since that time she has developed sore throat and has had decreased appetite and very fatigued really getting out of bed.  Also endorses mild headache.  Does have some mild runny nose no cough or dyspnea no GI symptoms or abdominal pain.  No sick contacts.     Past Medical History:  Diagnosis Date   Arthritis    right ankle   Complication of anesthesia 2013   seizure during appendectomy   GERD (gastroesophageal reflux disease)    occ no meds   Headache    migraines   History of methicillin resistant staphylococcus aureus (MRSA) 2010   Hypertension    been off bp meds due to no insurance 5-6 months   Seizures    last seizure was in May 2020    Patient Active Problem List   Diagnosis Date Noted   Prediabetes 09/21/2020   Elevated lipids 09/21/2020   Smoking 09/21/2020   Encounter to establish care 08/22/2020   Essential hypertension 08/22/2020   Abdominal bloating 08/22/2020   Ankle pain, right 08/22/2020   Snoring 08/22/2020   BMI 40.0-44.9, adult 05/26/2019   Seizure-like activity 05/11/2019   Aphasia 04/28/2018     Physical Exam  Triage Vital Signs: ED Triage Vitals  Enc Vitals Group     BP 02/26/23 0818 (!) 165/105     Pulse Rate 02/26/23 0818 (!) 118     Resp 02/26/23 0818 18     Temp 02/26/23 0818 98.1 F (36.7 C)     Temp Source 02/26/23 0818 Oral     SpO2 02/26/23 0818 96 %     Weight 02/26/23 0817 250 lb (113.4 kg)     Height 02/26/23 0817 5\' 4"  (1.626 m)     Head Circumference --       Peak Flow --      Pain Score 02/26/23 0816 5     Pain Loc --      Pain Edu? --      Excl. in [place]? --     Most recent vital signs: Vitals:   02/26/23 0818 02/26/23 0931  BP: (!) 165/105   Pulse: (!) 118 89  Resp: 18   Temp: 98.1 F (36.7 C)   SpO2: 96%      General: Awake, no distress.  CV:  Good peripheral perfusion.  Resp:  Normal effort.  Lung sounds are clear Abd:  No distention.  Neuro:             Awake, Alert, Oriented x 3  Other:  Mild erythema to posterior oropharynx, no exudate uvula midline Lymphadenopathy bilaterally Mild tenderness of the anterior thighs but no swelling compartments soft 2+ DP pulses bilaterally   ED Results / Procedures / Treatments  Labs (all labs ordered are listed, but only abnormal results are displayed) Labs Reviewed  RESP PANEL BY RT-PCR (RSV, FLU A&B, COVID)  [id number]     EKG    RADIOLOGY    PROCEDURES:  Critical Care performed:  No  Procedures   MEDICATIONS ORDERED IN ED: Medications  acetaminophen (TYLENOL) tablet 1,000 mg (1,000 mg Oral Given 02/26/23 0835)  ibuprofen (ADVIL) tablet 400 mg (400 mg Oral Given 02/26/23 0835)     IMPRESSION / MDM / ASSESSMENT AND PLAN / ED COURSE  I reviewed the triage vital signs and the nursing notes.                              Patient's presentation is most consistent with acute, uncomplicated illness.  Differential diagnosis includes, but is not limited to, viral illness such as COVID-19, influenza, low suspicion for strep pharyngitis, rhabdomyolysis  Patient is a 52 year old female who presents with myalgias sore throat fever decreased appetite x 3 days.  She is hypertensive and tachycardic to the 1 teens on arrival.  She looks nontoxic and overall well-appearing lung sounds are clear she does have some erythema in the posterior oropharynx with no exudate in the uvula is midline.  Does have bilateral thigh tenderness but compartments soft no swelling good pulses.  Constellation  of symptoms is most suggestive of viral syndrome.  Will test for COVID influenza.  She is not taking anything today for her symptoms will give Tylenol Motrin and reassess her heart rate.  COVID and flu testing pending at the time of discharge.  Heart rate has normalized.  Patient does have ongoing myalgias and the lower extremities.  Discussed supportive care including Tylenol NSAIDs and rest and hydration.  Patient is appropriate for discharge.      FINAL CLINICAL IMPRESSION(S) / ED DIAGNOSES   Final diagnoses:  Myalgia  Viral syndrome     Rx / DC Orders   ED Discharge Orders     None        Note:  This document was prepared using Dragon voice recognition software and may include unintentional dictation errors.   Georga Hacking, MD 02/26/23 [phone]

## 2023-02-26 NOTE — ED Triage Notes (Signed)
C/o Sore throat, body aches, and headache X2 days. Reports intermittent fevers

## 2023-02-26 NOTE — Discharge Instructions (Addendum)
I suspect that your muscle pain and sore throat are due to a viral illness.  You were tested for COVID flu and RSV which is still pending.  Take Tylenol Motrin for symptoms make sure you are staying hydrated and rest.

## 2023-04-22 ENCOUNTER — Emergency Department
Admission: EM | Admit: 2023-04-22 | Discharge: 2023-04-22 | Disposition: A | Discharge status: Home / Self Care | Payer: Self-pay | Attending: Emergency Medicine | Admitting: Emergency Medicine

## 2023-04-22 ENCOUNTER — Emergency Department: Payer: Medicaid Other

## 2023-04-22 ENCOUNTER — Other Ambulatory Visit: Payer: Self-pay

## 2023-04-22 ENCOUNTER — Encounter: Payer: Self-pay | Admitting: Emergency Medicine

## 2023-04-22 DIAGNOSIS — M7062 Trochanteric bursitis, left hip: Secondary | ICD-10-CM | POA: Insufficient documentation

## 2023-04-22 DIAGNOSIS — I10 Essential (primary) hypertension: Secondary | ICD-10-CM | POA: Insufficient documentation

## 2023-04-22 DIAGNOSIS — M62838 Other muscle spasm: Secondary | ICD-10-CM | POA: Insufficient documentation

## 2023-04-22 DIAGNOSIS — Y9389 Activity, other specified: Secondary | ICD-10-CM | POA: Insufficient documentation

## 2023-04-22 MED ORDER — HYDROCODONE-ACETAMINOPHEN 5-325 MG PO TABS: 1.0000 | ORAL_TABLET | Freq: Four times a day (QID) | ORAL | 0 refills | Status: AC | PRN

## 2023-04-22 MED ORDER — METHYLPREDNISOLONE 4 MG PO TBPK: ORAL_TABLET | ORAL | 0 refills | Status: DC

## 2023-04-22 MED ORDER — KETOROLAC TROMETHAMINE 30 MG/ML IJ SOLN
15.0000 mg | Freq: Once | INTRAMUSCULAR | Status: AC
  Administered 2023-04-22: 15 mg via INTRAMUSCULAR
  Filled 2023-04-22: qty 1

## 2023-04-22 MED ORDER — BACLOFEN 10 MG PO TABS: 10.0000 mg | ORAL_TABLET | Freq: Three times a day (TID) | ORAL | 0 refills | Status: AC

## 2023-04-22 NOTE — ED Triage Notes (Signed)
Pt via POV from home. Pt c/o L hip pain started 2 weeks ago that radiates down her L leg. Denies injury. Pt has a hx of arthritis. Pt is A&Ox4 and NAD

## 2023-04-22 NOTE — Discharge Instructions (Addendum)
Follow-up with orthopedics if not improving to 3 days.  Return if worsening.  Take medications as prescribed.  Apply ice to the left hip.

## 2023-04-22 NOTE — ED Provider Notes (Signed)
[facility] Provider Note    Event Date/Time   First MD Initiated Contact with Patient 04/22/23 1040     (approximate)   History   Hip Pain   HPI  Barbara Petersen is a 52 y.o. female with history of seizures, arthritis, hypertension, headaches presents emergency department with concerns of left hip pain for 2 weeks.  Patient states pain has increased.  Hurts to bear weight.  No known injury or fall.  No back pain.  No numbness or tingling.     Physical Exam   Triage Vital Signs: ED Triage Vitals  Enc Vitals Group     BP 04/22/23 1021 (!) 188/105     Pulse Rate 04/22/23 1021 86     Resp 04/22/23 1021 18     Temp 04/22/23 1021 98.6 F (37 C)     Temp Source 04/22/23 1021 Oral     SpO2 04/22/23 1021 96 %     Weight 04/22/23 1019 250 lb (113.4 kg)     Height 04/22/23 1019 5\' 4"  (1.626 m)     Head Circumference --      Peak Flow --      Pain Score 04/22/23 1019 0     Pain Loc --      Pain Edu? --      Excl. in [place]? --     Most recent vital signs: Vitals:   04/22/23 1021  BP: (!) 188/105  Pulse: 86  Resp: 18  Temp: 98.6 F (37 C)  SpO2: 96%     General: Awake, no distress.   CV:  Good peripheral perfusion. regular rate and  rhythm Resp:  Normal effort.  Abd:  No distention.   Other:  Left hip is tender to palpation, pain reproduced with all range of motion, trochanteric bursa   ED Results / Procedures / Treatments   Labs (all labs ordered are listed, but only abnormal results are displayed) Labs Reviewed - No data to display   EKG     RADIOLOGY X-ray of the left hip    PROCEDURES:   Procedures   MEDICATIONS ORDERED IN ED: Medications  ketorolac (TORADOL) 30 MG/ML injection 15 mg (has no administration in time range)     IMPRESSION / MDM / ASSESSMENT AND PLAN / ED COURSE  I reviewed the triage vital signs and the nursing notes.                              Differential diagnosis includes, but is not limited  to, bursitis, AVN, fracture, strain  Patient's presentation is most consistent with acute complicated illness / injury requiring diagnostic workup.   X-ray of the left hip independently reviewed and interpreted by me as being negative for any acute abnormality  I did explain these findings to the patient.  Her exam is consistent with a trochanteric bursitis.  She was given Toradol 15 mg IM while here in the ED.  She will be given a prescription for Medrol Dosepak, muscle relaxer, pain medication.  She is to follow-up with her regular doctor if not improving 3 days.  Follow-up with [facility] orthopedics if not improving in 1 week.  I did explain to her the better option would be a cortisone injection.  She states she understands.  She was discharged in stable condition and is in agreement treatment plan.      FINAL CLINICAL IMPRESSION(S) / ED DIAGNOSES  Final diagnoses:  Trochanteric bursitis of left hip  Muscle spasm     Rx / DC Orders   ED Discharge Orders          Ordered    methylPREDNISolone (MEDROL DOSEPAK) 4 MG TBPK tablet        04/22/23 1132    baclofen (LIORESAL) 10 MG tablet  3 times daily        04/22/23 1132    HYDROcodone-acetaminophen (NORCO/VICODIN) 5-325 MG tablet  Every 6 hours PRN        04/22/23 1132             Note:  This document was prepared using Dragon voice recognition software and may include unintentional dictation errors.    Faythe Ghee, PA-C 04/22/23 1200    Jene Every, MD 04/22/23 1201

## 2023-06-03 ENCOUNTER — Emergency Department
Admission: EM | Admit: 2023-06-03 | Discharge: 2023-06-03 | Disposition: A | Discharge status: Home / Self Care | Payer: Medicaid Other | Attending: Emergency Medicine | Admitting: Emergency Medicine

## 2023-06-03 ENCOUNTER — Other Ambulatory Visit: Payer: Self-pay

## 2023-06-03 DIAGNOSIS — R42 Dizziness and giddiness: Secondary | ICD-10-CM | POA: Insufficient documentation

## 2023-06-03 DIAGNOSIS — H538 Other visual disturbances: Secondary | ICD-10-CM | POA: Insufficient documentation

## 2023-06-03 DIAGNOSIS — R569 Unspecified convulsions: Secondary | ICD-10-CM | POA: Insufficient documentation

## 2023-06-03 LAB — BASIC METABOLIC PANEL
Anion gap: 10 (ref 5–15)
BUN: 12 mg/dL (ref 6–20)
CO2: 24 mmol/L (ref 22–32)
Calcium: 9.2 mg/dL (ref 8.9–10.3)
Chloride: 104 mmol/L (ref 98–111)
Creatinine, Ser: 0.89 mg/dL (ref 0.44–1.00)
GFR, Estimated: >60 mL/min (ref >60)
Glucose, Bld: 116 mg/dL — ABNORMAL HIGH (ref 70–99)
Potassium: 3.4 mmol/L — ABNORMAL LOW (ref 3.5–5.1)
Sodium: 138 mmol/L (ref 135–145)

## 2023-06-03 LAB — CBC WITH DIFFERENTIAL/PLATELET
Abs Immature Granulocytes: 0.01 10*3/uL (ref 0.00–0.07)
Basophils Absolute: 0 10*3/uL (ref 0.0–0.1)
Basophils Relative: 0 %
Eosinophils Absolute: 0.2 10*3/uL (ref 0.0–0.5)
Eosinophils Relative: 2 %
HCT: 39.4 % (ref 36.0–46.0)
Hemoglobin: 12.4 g/dL (ref 12.0–15.0)
Immature Granulocytes: 0 %
Lymphocytes Relative: 46 %
Lymphs Abs: 3.4 10*3/uL (ref 0.7–4.0)
MCH: 27.7 pg (ref 26.0–34.0)
MCHC: 31.5 g/dL (ref 30.0–36.0)
MCV: 87.9 fL (ref 80.0–100.0)
Monocytes Absolute: 0.6 10*3/uL (ref 0.1–1.0)
Monocytes Relative: 8 %
Neutro Abs: 3.3 10*3/uL (ref 1.7–7.7)
Neutrophils Relative %: 44 %
Platelets: 282 10*3/uL (ref 150–400)
RBC: 4.48 MIL/uL (ref 3.87–5.11)
RDW: 15.5 % (ref 11.5–15.5)
WBC: 7.4 10*3/uL (ref 4.0–10.5)
nRBC: 0 % (ref 0.0–0.2)

## 2023-06-03 MED ORDER — HYDROXYZINE HCL 25 MG PO TABS
50.0000 mg | ORAL_TABLET | Freq: Once | ORAL | Status: AC
  Administered 2023-06-03: 50 mg via ORAL
  Filled 2023-06-03: qty 2

## 2023-06-03 NOTE — ED Provider Notes (Signed)
[facility] Provider Note    Event Date/Time   First MD Initiated Contact with Patient 06/03/23 0025     (approximate)   History   Seizures   HPI  Barbara Petersen is a 52 y.o. female who presents to the ED for evaluation of Seizures   Patient presents to the ED from home via EMS for evaluation of seizure-like activity.  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  She reports having a lot of stress over the past 2 or 3 days, "relationship troubles."  Reports tonight that she was seated, talking to her son when she developed diffuse sensation of dizziness and blurry vision while seated.  Reports she is uncertain what happened, she is tearful and reports she is under a lot of stress.  Reports she feels fine now and has no complaints.  Denies any preceding pain such as headache or chest pain.  No pain now.  No recent illnesses   Physical Exam   Triage Vital Signs: ED Triage Vitals  Encounter Vitals Group     BP 06/03/23 0027 (!) 164/85     Systolic BP Percentile --      Diastolic BP Percentile --      Pulse Rate 06/03/23 0032 78     Resp 06/03/23 0027 13     Temp 06/03/23 0033 99 F (37.2 C)     Temp Source 06/03/23 0033 Oral     SpO2 06/03/23 0032 98 %     Weight --      Height --      Head Circumference --      Peak Flow --      Pain Score 06/03/23 0027 0     Pain Loc --      Pain Education --      Exclude from Growth Chart --     Most recent vital signs: Vitals:   06/03/23 0130 06/03/23 0200  BP: (!) 159/79 125/75  Pulse: 70 64  Resp: 11 17  Temp:    SpO2: 96% 94%    General: Awake, no distress.  Tearful and reserved CV:  Good peripheral perfusion.  Resp:  Normal effort.  Abd:  No distention.  MSK:  No deformity noted.  Neuro:  No focal deficits appreciated. Cranial nerves II through XII intact 5/5 strength and sensation in all  4 extremities Other:     ED Results / Procedures / Treatments   Labs (all labs ordered are listed, but only abnormal results are displayed) Labs Reviewed  BASIC METABOLIC PANEL - Abnormal; Notable for the following components:      Result Value   Potassium 3.4 (*)    Glucose, Bld 116 (*)    All other components within normal limits  CBC WITH DIFFERENTIAL/PLATELET  URINALYSIS, ROUTINE W REFLEX MICROSCOPIC    EKG Sinus rhythm with a rate of 64 bpm.  Tremulous baseline.  Normal axis and intervals.  Nonspecific ST changes laterally and inferiorly without STEMI.  RADIOLOGY   Official radiology report(s): No results found.  PROCEDURES and INTERVENTIONS:  .1-3 Lead EKG Interpretation  Performed by: Delton Prairie, MD Authorized by: Delton Prairie, MD     Interpretation: normal     ECG rate:  70   ECG rate assessment: normal     Rhythm: sinus rhythm     Ectopy: none     Conduction:  normal     Medications  hydrOXYzine (ATARAX) tablet 50 mg (50 mg Oral Given 06/03/23 0045)     IMPRESSION / MDM / ASSESSMENT AND PLAN / ED COURSE  I reviewed the triage vital signs and the nursing notes.  Differential diagnosis includes, but is not limited to, epilepsy, status epilepticus, seizure, pseudoseizure, cardiogenic dysrhythmia or syncope, stroke  {Patient presents with symptoms of an acute illness or injury that is potentially life-threatening.  Patient presents with seizure-like activity in the setting of emotional stressors, possibly pseudoseizure and ultimately suitable for outpatient management.  She is tearful but conversational.  No signs of psychiatric emergency to require IVC or emergent psychiatric evaluation.  No signs of recurrent seizure activity or status epilepticus.  Reassuring vitals, exam and blood work.  Normal CBC and metabolic panel.  No dysrhythmias on the monitor.  Suspect nonepileptic seizure activity.  Suitable for outpatient management.  Clinical Course as of  06/03/23 0210  Tue Jun 03, 2023  0104 Reassessed. More talkative now. Her mother and son are at the bedside. Son provides hx as he was there tonight [DS]  38 Mother of patient pulls me aside and requests discharge. Patient is ready to go [DS]    Clinical Course User Index [DS] Delton Prairie, MD     FINAL CLINICAL IMPRESSION(S) / ED DIAGNOSES   Final diagnoses:  Seizure-like activity (HCC)     Rx / DC Orders   ED Discharge Orders     None        Note:  This document was prepared using Dragon voice recognition software and may include unintentional dictation errors.   Delton Prairie, MD 06/03/23 [phone]

## 2023-06-03 NOTE — ED Triage Notes (Signed)
Pt to ED by EMS from home with c/o seizure like activity, pt has a history of the same. Arrives A+O, NADN.

## 2023-11-02 ENCOUNTER — Other Ambulatory Visit: Payer: Self-pay

## 2023-11-02 ENCOUNTER — Emergency Department
Admission: EM | Admit: 2023-11-02 | Discharge: 2023-11-02 | Disposition: A | Discharge status: Home / Self Care | Payer: Medicaid Other | Attending: Emergency Medicine | Admitting: Emergency Medicine

## 2023-11-02 ENCOUNTER — Emergency Department: Payer: Medicaid Other

## 2023-11-02 DIAGNOSIS — Z1152 Encounter for screening for COVID-19: Secondary | ICD-10-CM | POA: Insufficient documentation

## 2023-11-02 DIAGNOSIS — J069 Acute upper respiratory infection, unspecified: Secondary | ICD-10-CM | POA: Diagnosis not present

## 2023-11-02 DIAGNOSIS — J9801 Acute bronchospasm: Secondary | ICD-10-CM | POA: Insufficient documentation

## 2023-11-02 DIAGNOSIS — R059 Cough, unspecified: Secondary | ICD-10-CM | POA: Diagnosis present

## 2023-11-02 LAB — RESP PANEL BY RT-PCR (RSV, FLU A&B, COVID)  RVPGX2
Influenza A by PCR: NEGATIVE
Influenza B by PCR: NEGATIVE
Resp Syncytial Virus by PCR: NEGATIVE
SARS Coronavirus 2 by RT PCR: NEGATIVE

## 2023-11-02 MED ORDER — ALBUTEROL SULFATE HFA 108 (90 BASE) MCG/ACT IN AERS: 2.0000 | INHALATION_SPRAY | Freq: Four times a day (QID) | RESPIRATORY_TRACT | 2 refills | Status: AC | PRN

## 2023-11-02 MED ORDER — PREDNISONE 50 MG PO TABS: 50.0000 mg | ORAL_TABLET | Freq: Every day | ORAL | 0 refills | Status: DC

## 2023-11-02 MED ORDER — IPRATROPIUM-ALBUTEROL 0.5-2.5 (3) MG/3ML IN SOLN
3.0000 mL | Freq: Once | RESPIRATORY_TRACT | Status: AC
  Administered 2023-11-02: 3 mL via RESPIRATORY_TRACT
  Filled 2023-11-02: qty 3

## 2023-11-02 MED ORDER — PREDNISONE 20 MG PO TABS
60.0000 mg | ORAL_TABLET | Freq: Once | ORAL | Status: AC
  Administered 2023-11-02: 60 mg via ORAL
  Filled 2023-11-02: qty 3

## 2023-11-02 NOTE — ED Provider Triage Note (Signed)
Emergency Medicine Provider Triage Evaluation Note  Micahla Cabo , a 52 y.o. female  was evaluated in triage.  Pt complains of cough, congestion, some shortness of breath on exertion due to cough.  Review of Systems  Positive:  Negative:   Physical Exam  BP (!) 154/87   Pulse 96   Temp 98 F (36.7 C)   Resp 20   Ht 5\' 4"  (1.626 m)   Wt 111.1 kg   SpO2 97%   BMI 42.05 kg/m  Gen:   Awake, no distress   Resp:  Normal effort wheezing noted in all lung fields MSK:   Moves extremities without difficulty  Other:    Medical Decision Making  Medically screening exam initiated at 1:51 PM.  Appropriate orders placed.  Lilybeth Ernsberger was informed that the remainder of the evaluation will be completed by another provider, this initial triage assessment does not replace that evaluation, and the importance of remaining in the ED until their evaluation is complete.     Faythe Ghee, PA-C 11/02/23 1351

## 2023-11-02 NOTE — ED Provider Notes (Signed)
[facility] Provider Note    Event Date/Time   First MD Initiated Contact with Patient 11/02/23 1417     (approximate)   History   Cough   HPI  Barbara Petersen is a 52 y.o. female who presents with complaints of cough, patient reports last week she developed upper respiratory infection, severe cough, seem to improve somewhat however she continues to have cough and tightness and discomfort in her chest when coughing.  No fevers reported     Physical Exam   Triage Vital Signs: ED Triage Vitals  Encounter Vitals Group     BP 11/02/23 1342 (!) 154/87     Systolic BP Percentile --      Diastolic BP Percentile --      Pulse Rate 11/02/23 1342 96     Resp 11/02/23 1342 20     Temp 11/02/23 1342 98 F (36.7 C)     Temp src --      SpO2 11/02/23 1342 97 %     Weight 11/02/23 1341 111.1 kg (245 lb)     Height 11/02/23 1341 1.626 m (5\' 4" )     Head Circumference --      Peak Flow --      Pain Score 11/02/23 1341 0     Pain Loc --      Pain Education --      Exclude from Growth Chart --     Most recent vital signs: Vitals:   11/02/23 1342  BP: (!) 154/87  Pulse: 96  Resp: 20  Temp: 98 F (36.7 C)  SpO2: 97%     General: Awake, no distress.  CV:  Good peripheral perfusion.  Resp:  Normal effort.  Scattered wheezing on exam Abd:  No distention.  Other:  No calf pain or swelling   ED Results / Procedures / Treatments   Labs (all labs ordered are listed, but only abnormal results are displayed) Labs Reviewed  RESP PANEL BY RT-PCR (RSV, FLU A&B, COVID)  [id number]     EKG     RADIOLOGY Chest x-ray viewed interpret by me, no acute abnormality    PROCEDURES:  Critical Care performed:   Procedures   MEDICATIONS ORDERED IN ED: Medications  ipratropium-albuterol (DUONEB) 0.5-2.5 (3) MG/3ML nebulizer solution 3 mL (3 mLs Nebulization Given 11/02/23 1536)  ipratropium-albuterol (DUONEB) 0.5-2.5 (3) MG/3ML nebulizer solution 3 mL (3  mLs Nebulization Given 11/02/23 1535)  predniSONE (DELTASONE) tablet 60 mg (60 mg Oral Given 11/02/23 1534)     IMPRESSION / MDM / ASSESSMENT AND PLAN / ED COURSE  I reviewed the triage vital signs and the nursing notes. Patient's presentation is most consistent with acute illness / injury with system symptoms.  Patient presents with cough, chest tightness as detailed above.  On exam she has some scattered wheezing suspicious for bronchospasm.  Chest x-ray is negative for pneumonia.  Treated with DuoNebs, p.o. prednisone with significant improvement, no indication for admission, appropriate for discharge with outpatient follow, will prescribe prednisone p.o., albuterol inhaler, strict return precautions        FINAL CLINICAL IMPRESSION(S) / ED DIAGNOSES   Final diagnoses:  Bronchospasm  Viral URI with cough     Rx / DC Orders   ED Discharge Orders          Ordered    predniSONE (DELTASONE) 50 MG tablet  Daily with breakfast        11/02/23 1610    albuterol (VENTOLIN HFA) 108 (  90 Base) MCG/ACT inhaler  Every 6 hours PRN        11/02/23 1610             Note:  This document was prepared using Dragon voice recognition software and may include unintentional dictation errors.   Jene Every, MD 11/02/23 [phone]

## 2023-11-02 NOTE — ED Triage Notes (Signed)
Pt comes with c/o runny nose, cough and congestion since last MOnday. Pt took home covid test and it was neg. Pt states cough and symptoms continued. Pt states worse at night.

## 2024-03-28 ENCOUNTER — Observation Stay
Admission: EM | Admit: 2024-03-28 | Discharge: 2024-03-30 | Disposition: A | Discharge status: Home / Self Care | Attending: Internal Medicine | Admitting: Internal Medicine

## 2024-03-28 ENCOUNTER — Emergency Department

## 2024-03-28 DIAGNOSIS — Z96661 Presence of right artificial ankle joint: Secondary | ICD-10-CM | POA: Diagnosis not present

## 2024-03-28 DIAGNOSIS — R569 Unspecified convulsions: Secondary | ICD-10-CM | POA: Diagnosis not present

## 2024-03-28 DIAGNOSIS — R7303 Prediabetes: Secondary | ICD-10-CM | POA: Diagnosis not present

## 2024-03-28 DIAGNOSIS — F17210 Nicotine dependence, cigarettes, uncomplicated: Secondary | ICD-10-CM | POA: Diagnosis not present

## 2024-03-28 DIAGNOSIS — I10 Essential (primary) hypertension: Secondary | ICD-10-CM | POA: Diagnosis not present

## 2024-03-28 DIAGNOSIS — Z7982 Long term (current) use of aspirin: Secondary | ICD-10-CM | POA: Insufficient documentation

## 2024-03-28 DIAGNOSIS — Z7984 Long term (current) use of oral hypoglycemic drugs: Secondary | ICD-10-CM | POA: Diagnosis not present

## 2024-03-28 DIAGNOSIS — G40909 Epilepsy, unspecified, not intractable, without status epilepticus: Secondary | ICD-10-CM | POA: Diagnosis present

## 2024-03-28 DIAGNOSIS — G8929 Other chronic pain: Secondary | ICD-10-CM

## 2024-03-28 DIAGNOSIS — Z79899 Other long term (current) drug therapy: Secondary | ICD-10-CM | POA: Diagnosis not present

## 2024-03-28 DIAGNOSIS — M25571 Pain in right ankle and joints of right foot: Secondary | ICD-10-CM | POA: Diagnosis not present

## 2024-03-28 LAB — URINALYSIS, COMPLETE (UACMP) WITH MICROSCOPIC
Bilirubin Urine: NEGATIVE
Glucose, UA: NEGATIVE mg/dL
Hgb urine dipstick: NEGATIVE
Ketones, ur: NEGATIVE mg/dL
Leukocytes,Ua: NEGATIVE
Nitrite: NEGATIVE
Protein, ur: NEGATIVE mg/dL
Specific Gravity, Urine: 1.005 (ref 1.005–1.030)
pH: 6 (ref 5.0–8.0)

## 2024-03-28 LAB — CBC
HCT: 36.4 % (ref 36.0–46.0)
Hemoglobin: 11.7 g/dL — ABNORMAL LOW (ref 12.0–15.0)
MCH: 28.3 pg (ref 26.0–34.0)
MCHC: 32.1 g/dL (ref 30.0–36.0)
MCV: 88.1 fL (ref 80.0–100.0)
Platelets: 266 10*3/uL (ref 150–400)
RBC: 4.13 MIL/uL (ref 3.87–5.11)
RDW: 14.9 % (ref 11.5–15.5)
WBC: 7 10*3/uL (ref 4.0–10.5)
nRBC: 0 % (ref 0.0–0.2)

## 2024-03-28 LAB — COMPREHENSIVE METABOLIC PANEL WITH GFR
ALT: 16 U/L (ref 0–44)
AST: 25 U/L (ref 15–41)
Albumin: 3.7 g/dL (ref 3.5–5.0)
Alkaline Phosphatase: 51 U/L (ref 38–126)
Anion gap: 11 (ref 5–15)
BUN: 15 mg/dL (ref 6–20)
CO2: 26 mmol/L (ref 22–32)
Calcium: 9.1 mg/dL (ref 8.9–10.3)
Chloride: 99 mmol/L (ref 98–111)
Creatinine, Ser: 0.9 mg/dL (ref 0.44–1.00)
GFR, Estimated: >60 mL/min (ref >60)
Glucose, Bld: 141 mg/dL — ABNORMAL HIGH (ref 70–99)
Potassium: 3.6 mmol/L (ref 3.5–5.1)
Sodium: 136 mmol/L (ref 135–145)
Total Bilirubin: 0.5 mg/dL (ref 0.0–1.2)
Total Protein: 8 g/dL (ref 6.5–8.1)

## 2024-03-28 LAB — URINE DRUG SCREEN, QUALITATIVE (ARMC ONLY)
Amphetamines, Ur Screen: NOT DETECTED
Barbiturates, Ur Screen: NOT DETECTED
Benzodiazepine, Ur Scrn: POSITIVE — AB
Cannabinoid 50 Ng, Ur ~~LOC~~: NOT DETECTED
Cocaine Metabolite,Ur ~~LOC~~: NOT DETECTED
MDMA (Ecstasy)Ur Screen: NOT DETECTED
Methadone Scn, Ur: NOT DETECTED
Opiate, Ur Screen: NOT DETECTED
Phencyclidine (PCP) Ur S: NOT DETECTED
Tricyclic, Ur Screen: NOT DETECTED

## 2024-03-28 LAB — CBG MONITORING, ED: Glucose-Capillary: 114 mg/dL — ABNORMAL HIGH (ref 70–99)

## 2024-03-28 MED ORDER — LISINOPRIL-HYDROCHLOROTHIAZIDE 20-12.5 MG PO TABS
1.0000 | ORAL_TABLET | Freq: Every day | ORAL | Status: DC
Start: 2024-03-29 — End: 2024-03-28

## 2024-03-28 MED ORDER — BACLOFEN 10 MG PO TABS
10.0000 mg | ORAL_TABLET | Freq: Three times a day (TID) | ORAL | Status: DC
  Administered 2024-03-29 – 2024-03-30 (×4): 10 mg via ORAL
  Filled 2024-03-28 (×4): qty 1

## 2024-03-28 MED ORDER — ACETAMINOPHEN 325 MG PO TABS
650.0000 mg | ORAL_TABLET | Freq: Four times a day (QID) | ORAL | Status: DC | PRN
Start: 2024-03-28 — End: 2024-03-30

## 2024-03-28 MED ORDER — LEVETIRACETAM 750 MG PO TABS
750.0000 mg | ORAL_TABLET | Freq: Two times a day (BID) | ORAL | Status: DC
  Administered 2024-03-29 – 2024-03-30 (×3): 750 mg via ORAL
  Filled 2024-03-28 (×3): qty 1

## 2024-03-28 MED ORDER — LISINOPRIL 20 MG PO TABS
20.0000 mg | ORAL_TABLET | Freq: Every day | ORAL | Status: DC
  Administered 2024-03-29 – 2024-03-30 (×2): 20 mg via ORAL
  Filled 2024-03-28: qty 1
  Filled 2024-03-28: qty 2

## 2024-03-28 MED ORDER — ONDANSETRON HCL 4 MG PO TABS: 4.0000 mg | ORAL_TABLET | Freq: Four times a day (QID) | ORAL | Status: DC | PRN

## 2024-03-28 MED ORDER — LEVETIRACETAM 500 MG PO TABS: 500.0000 mg | ORAL_TABLET | Freq: Two times a day (BID) | ORAL | 5 refills | Status: DC

## 2024-03-28 MED ORDER — LEVETIRACETAM (KEPPRA) 500 MG/5 ML ADULT IV PUSH
1500.0000 mg | Freq: Once | INTRAVENOUS | Status: AC
  Administered 2024-03-28: 1500 mg via INTRAVENOUS
  Filled 2024-03-28: qty 15

## 2024-03-28 MED ORDER — OXYCODONE HCL 5 MG PO TABS
5.0000 mg | ORAL_TABLET | Freq: Four times a day (QID) | ORAL | Status: DC | PRN
  Administered 2024-03-29 – 2024-03-30 (×3): 5 mg via ORAL
  Filled 2024-03-28 (×3): qty 1

## 2024-03-28 MED ORDER — LEVETIRACETAM (KEPPRA) 500 MG/5 ML ADULT IV PUSH
3000.0000 mg | Freq: Once | INTRAVENOUS | Status: AC
  Administered 2024-03-28: 3000 mg via INTRAVENOUS
  Filled 2024-03-28: qty 30

## 2024-03-28 MED ORDER — ONDANSETRON HCL 4 MG/2ML IJ SOLN: 4.0000 mg | Freq: Four times a day (QID) | INTRAMUSCULAR | Status: DC | PRN

## 2024-03-28 MED ORDER — POLYETHYLENE GLYCOL 3350 17 G PO PACK: 17.0000 g | PACK | Freq: Every day | ORAL | Status: DC | PRN

## 2024-03-28 MED ORDER — HYDROCHLOROTHIAZIDE 12.5 MG PO TABS
12.5000 mg | ORAL_TABLET | Freq: Every day | ORAL | Status: DC
  Administered 2024-03-29 – 2024-03-30 (×2): 12.5 mg via ORAL
  Filled 2024-03-28 (×2): qty 1

## 2024-03-28 MED ORDER — ASPIRIN 81 MG PO CHEW
81.0000 mg | CHEWABLE_TABLET | Freq: Every day | ORAL | Status: DC
  Administered 2024-03-29 – 2024-03-30 (×2): 81 mg via ORAL
  Filled 2024-03-28 (×2): qty 1

## 2024-03-28 MED ORDER — PREGABALIN 75 MG PO CAPS
75.0000 mg | ORAL_CAPSULE | Freq: Two times a day (BID) | ORAL | Status: DC
  Administered 2024-03-28 – 2024-03-30 (×4): 75 mg via ORAL
  Filled 2024-03-28 (×4): qty 1

## 2024-03-28 MED ORDER — ENOXAPARIN SODIUM 60 MG/0.6ML IJ SOSY
55.0000 mg | PREFILLED_SYRINGE | INTRAMUSCULAR | Status: DC
  Filled 2024-03-28: qty 0.6

## 2024-03-28 NOTE — Assessment & Plan Note (Signed)
 Per chart review, patient has a history of seizure disorder for which she was previously on Keppra.  She notes that she ran out, and her PCP did not restart it.  She has been having intermittent seizures with relatively quick recovery, however multiple seizures today with notable postictal states.  - Telemetry monitoring - Case discussed with neurology; recommending 4.5 g load followed by 750 mg twice daily - If additional breakthrough seizures should occur, will place formal consult - No indication for EEG at this time as patient is returning back to normal - Neurochecks every 4 hours - Seizure, aspiration and fall precautions

## 2024-03-28 NOTE — ED Provider Notes (Signed)
 [facility] Provider Note    Event Date/Time   First MD Initiated Contact with Patient 03/28/24 1436     (approximate)  History   Chief Complaint: Seizures  HPI  Barbara Petersen is a 53 y.o. female with a past medical history of seizures, hypertension, gastric reflux, presents to the emergency department after reported seizure activity.  According to EMS report patient is coming from home after witnessed seizure activity lasting approximately 20 seconds.  Patient states a history of seizures but is not taking any medications.  Patient is scheduled for a right ankle surgery on Friday and is currently splinted.  Patient is not sure if she fell or hit her head.  Patient is still somnolent received 5 mg of Versed  by EMS prior to arrival.  Patient was incontinent of urine prior to arrival as well.  Physical Exam   Triage Vital Signs: ED Triage Vitals  Encounter Vitals Group     BP 03/28/24 1437 (!) 126/100     Systolic BP Percentile --      Diastolic BP Percentile --      Pulse Rate 03/28/24 1437 95     Resp 03/28/24 1437 18     Temp 03/28/24 1437 97.8 F (36.6 C)     Temp Source 03/28/24 1437 Oral     SpO2 03/28/24 1437 97 %     Weight 03/28/24 1440 257 lb 9.6 oz (116.8 kg)     Height 03/28/24 1440 5\' 4"  (1.626 m)     Head Circumference --      Peak Flow --      Pain Score --      Pain Loc --      Pain Education --      Exclude from Growth Chart --     Most recent vital signs: Vitals:   03/28/24 1437 03/28/24 1440  BP: (!) 126/100 (!) 141/76  Pulse: 95   Resp: 18   Temp: 97.8 F (36.6 C)   SpO2: 97%     General: Awake, no distress.  CV:  Good peripheral perfusion.  Regular rate and rhythm  Resp:  Normal effort.  Equal breath sounds bilaterally.  Abd:  No distention.  Soft, nontender.  No rebound or guarding. Other:  Right lower extremity currently in a splint.   ED Results / Procedures / Treatments    MEDICATIONS ORDERED IN  ED: Medications  levETIRAcetam (KEPPRA) undiluted injection 1,500 mg (has no administration in time range)     IMPRESSION / MDM / ASSESSMENT AND PLAN / ED COURSE  I reviewed the triage vital signs and the nursing notes.  Patient's presentation is most consistent with acute presentation with potential threat to life or bodily function.  Patient presents emergency department after reported seizure activity at home.  I reviewed the patient's chart back in 2020 she saw Dr. Walden Guise was diagnosed with possible seizure disorder started on Keppra 500 mg twice daily.  Patient admits since that time she has not been taking the medication but states she has not had any recent seizures.  Patient remains fairly somnolent likely combination of postictal state as well as Versed  given by EMS.  We will check labs to obtain a CT scan of the head we will load with IV Keppra 1500 mg and continue to closely monitor while awaiting results.  As long as the patient returns back to her baseline state and labs and CT did not show any significant findings anticipate the patient  can be discharged home and restarted on Keppra 500 mg twice daily.  I discussed with the patient as well not driving until cleared by neurology.  Will refer back to Dr. Walden Guise.  Labs and CT pending.  Patient care signed out to oncoming provider.  FINAL CLINICAL IMPRESSION(S) / ED DIAGNOSES   Seizure   Note:  This document was prepared using Dragon voice recognition software and may include unintentional dictation errors.   Ruth Cove, MD 03/28/24 1443

## 2024-03-28 NOTE — ED Notes (Signed)
 Hospitalist at bedside

## 2024-03-28 NOTE — ED Notes (Signed)
 Limited triage assessment at this time due to post ictal

## 2024-03-28 NOTE — ED Provider Notes (Signed)
  Physical Exam  BP (!) 131/94   Pulse (!) 56   Temp 97.8 F (36.6 C) (Oral)   Resp (!) 21   Ht 5\' 4"  (1.626 m)   Wt 116.8 kg   SpO2 98%   BMI 44.22 kg/m   Physical Exam  Procedures  Procedures  ED Course / MDM   Clinical Course as of 03/28/24 1751  Sun Mar 28, 2024  1721 Patient had repeat seizure here in the room lasting approximately 5 minutes per family at bedside.  Confused again.  Will discuss with neurology and admit to hospitalist for further care. [DW]  1729 Neurology - 60 mg/kg of keppra total.  [DW]    Clinical Course User Index [DW] Kandee Orion, MD   Medical Decision Making Amount and/or Complexity of Data Reviewed Labs: ordered. Radiology: ordered.  Risk Prescription drug management. Decision regarding hospitalization.   Received patient in signout.  53 year old female with history of seizures presenting today for seizure activity.  Initially assessed by provider and was postictal and given 1500 mg of Keppra.  Was started to come back around.  Laboratory workup largely reassuring.  Signed out pending CT head and reassessment.  Patient then reportedly had another seizure in the ED lasting for approximately 3 to 4 minutes.  Discussed case with neurology who recommends additional 3 g of Keppra and starting on Keppra 750 mg twice daily.  Admission to hospitalist for ongoing observation.     Kandee Orion, MD 03/28/24 [phone]

## 2024-03-28 NOTE — Discharge Instructions (Addendum)
 Please take your Keppra as prescribed starting this evening.  Please follow-up with Dr. Walden Guise of neurology by calling the number provided to arrange a follow-up appointment.  As we discussed do not drive until cleared by neurology.  Return to the emergency department for any further seizure-like activity, or any other symptom personally concerning to yourself.

## 2024-03-28 NOTE — Assessment & Plan Note (Signed)
 Diet controlled prediabetes with last A1c of 5.9%.  No indication for SSI.

## 2024-03-28 NOTE — Assessment & Plan Note (Signed)
 -  Continue home regimen

## 2024-03-28 NOTE — H&P (Signed)
 History and Physical    Patient: Barbara Petersen WUJ:[id number] DOB: 1971-11-18 DOA: 03/28/2024 DOS: the patient was seen and examined on 03/28/2024 PCP: [facility]  Patient coming from: Home  Chief Complaint:  Chief Complaint  Patient presents with   Seizures   HPI: Barbara Petersen is a 53 y.o. female with medical history significant of seizure disorder not on AEDs, hypertension, hyperlipidemia, right ankle arthritis s/p recent arthroscopy, GERD, who presents to the ED due to seizure.  History provided by both patient and her sister and fianc at bedside.  [name] states that she remembers feeling unwell earlier this morning, as though her right arm was swelling.  And then she does not remember anything prior to arrival to the ED.  Her fianc, Gaspar Karma, states that she began to have a tonic-clonic seizure that lasted approximately 3-5 minutes.  Due to this, EMS was called.  [name] sister states that she has been having breakthrough seizures here and there, however they are small and she is able to recover relatively well without intervention.  They note she is typically lethargic and confused after seizures.  They note that this is the biggest cluster of seizures she has had in several years now  ED course: On arrival to the ED, patient was hypertensive at 141/76 with heart rate 75.  She was saturating at 97% on room air.  She was afebrile and 97.8.  Initial CBC and CMP unremarkable.  Urinalysis with rare bacteria but no other findings.  UDS positive for benzos (received Versed  via EMS).  CT head with no acute intracranial abnormalities.  Patient suffered an additional breakthrough seizure in the ED lasting between 3-5 minutes. Patient started on Keppra and neurology consulted.  [facility] contacted for admission.  Review of Systems: As mentioned in the history of present illness. All other systems reviewed and are negative.  Past Medical History:  Diagnosis Date   Arthritis    right ankle    Complication of anesthesia 2013   seizure during appendectomy   GERD (gastroesophageal reflux disease)    occ no meds   Headache    migraines   History of methicillin resistant staphylococcus aureus (MRSA) 2010   Hypertension    been off bp meds due to no insurance 5-6 months   Seizures (HCC)    last seizure was in May 2020   Past Surgical History:  Procedure Laterality Date   APPENDECTOMY     CHOLECYSTECTOMY     HARDWARE REMOVAL Right 05/13/2019   Procedure: HARDWARE REMOVAL POSTERIOR RIGHT ANKLE;  Surgeon: Molli Angelucci, MD;  Location: [facility] ORS;  Service: Orthopedics;  Laterality: Right;   ORIF ANKLE FRACTURE Right 11/10/2018   Procedure: OPEN REDUCTION INTERNAL FIXATION (ORIF) ANKLE FRACTURE;  Surgeon: Molli Angelucci, MD;  Location: [facility] ORS;  Service: Orthopedics;  Laterality: Right;   TUBAL LIGATION     Social History:  reports that she has been smoking cigarettes. She has a 3.8 pack-year smoking history. She has never used smokeless tobacco. She reports current alcohol use. She reports that she does not use drugs.  Allergies  Allergen Reactions   Gabapentin Swelling and Other (See Comments)    Confusion    History reviewed. No pertinent family history.  Prior to Admission medications   Medication Sig Start Date End Date Taking? Authorizing Provider  levETIRAcetam (KEPPRA) 500 MG tablet Take 1 tablet (500 mg total) by mouth 2 (two) times daily. 03/28/24  Yes Ruth Cove, MD  albuterol  (VENTOLIN  HFA) 108 (  90 Base) MCG/ACT inhaler Inhale 2 puffs into the lungs every 6 (six) hours as needed for wheezing or shortness of breath. 11/02/23   Bryson Carbine, MD  HYDROcodone -acetaminophen  (NORCO/VICODIN) 5-325 MG tablet Take 1 tablet by mouth every 6 (six) hours as needed for moderate pain. 04/22/23   Fisher, Rufino Coulter, PA-C  lisinopril  (ZESTRIL ) 20 MG tablet TAKE ONE TABLET BY MOUTH EVERY DAY 10/19/20 05/11/22  Iloabachie, Chioma E, NP  metFORMIN  (GLUCOPHAGE ) 1000 MG tablet Take  0.5 tablets (500 mg total) by mouth daily with breakfast. 10/19/20   Iloabachie, Chioma E, NP  metFORMIN  (GLUCOPHAGE ) 500 MG tablet TAKE ONE TABLET BY MOUTH EVERY DAY WITH BREAKFAST 10/19/20 10/19/21  Iloabachie, Chioma E, NP  methocarbamol  (ROBAXIN ) 500 MG tablet 1-2 every 6 hours prn muscle pain 05/11/22   Stafford Eagles, PA-C  methylPREDNISolone  (MEDROL  DOSEPAK) 4 MG TBPK tablet Take 6 pills on day one then decrease by 1 pill each day 04/22/23   Delsie Figures, PA-C  naproxen  (NAPROSYN ) 500 MG tablet Take 1 tablet (500 mg total) by mouth 2 (two) times daily with a meal. 05/11/22   Deeanna Farm L, PA-C  pravastatin  (PRAVACHOL ) 20 MG tablet TAKE 1/2 TABLET (10MG ) BY MOUTH EVERY DAY 10/19/20 03/17/21  Iloabachie, Chioma E, NP  predniSONE  (DELTASONE ) 50 MG tablet Take 1 tablet (50 mg total) by mouth daily with breakfast. 11/03/23   Bryson Carbine, MD    Physical Exam: Vitals:   03/28/24 1437 03/28/24 1440 03/28/24 1600 03/28/24 1630  BP: (!) 126/100 (!) 141/76 (!) 131/94 (!) 131/94  Pulse: 95  61 (!) 56  Resp: 18  17 (!) 21  Temp: 97.8 F (36.6 C)     TempSrc: Oral     SpO2: 97%  96% 98%  Weight:  116.8 kg    Height:  5\' 4"  (1.626 m)     Physical Exam Vitals and nursing note reviewed.  Constitutional:      General: She is not in acute distress.    Appearance: She is obese.  HENT:     Head: Normocephalic and atraumatic.  Cardiovascular:     Rate and Rhythm: Normal rate and regular rhythm.     Heart sounds: No murmur heard. Pulmonary:     Effort: Pulmonary effort is normal. No respiratory distress.     Breath sounds: Normal breath sounds. No wheezing.  Abdominal:     General: Bowel sounds are normal.     Palpations: Abdomen is soft.  Musculoskeletal:     Left lower leg: No edema.     Comments: Right lower extremity wrapped in Ace bandage  Skin:    General: Skin is warm and dry.  Neurological:     Mental Status: She is alert.     Comments:  Patient is alert and oriented, but  dazed and not able to provide much history.    Data Reviewed: CBC with WBC of 7.0, hemoglobin of 11.7, platelets of 266 CMP with sodium of 136, potassium 3.6, bicarb 26, glucose 141, BUN 15, creatinine 0.90, AST 25, ALT 16, GFR above 60 Urinalysis with rare bacteria UDS positive for benzos  CT HEAD WO CONTRAST ( ) Result Date: 03/28/2024 CLINICAL DATA:  Seizure disorder, clinical change EXAM: CT HEAD WITHOUT CONTRAST TECHNIQUE: Contiguous axial images were obtained from the base of the skull through the vertex without intravenous contrast. RADIATION DOSE REDUCTION: This exam was performed according to the departmental dose-optimization program which includes automated exposure control, adjustment of the mA and/or kV  according to patient size and/or use of iterative reconstruction technique. COMPARISON:  Head CT 04/27/2018 FINDINGS: Brain: No intracranial hemorrhage, mass effect, or midline shift. No hydrocephalus. The basilar cisterns are patent. Chronic enlarged empty sella. No evidence of territorial infarct or acute ischemia. No extra-axial or intracranial fluid collection. Vascular: No hyperdense vessel or unexpected calcification. Skull: Normal. Negative for fracture or focal lesion. Sinuses/Orbits: Minimal retained secretions within the right side of sphenoid sinus. Other: None. IMPRESSION: 1. No acute intracranial abnormality. 2. Chronic enlarged empty sella. This can be seen with idiopathic intracranial hypertension. Electronically Signed   By: Chadwick Colonel M.D.   On: 03/28/2024 16:40   There are no new results to review at this time.  Assessment and Plan:  * Seizure [facility]) Per chart review, patient has a history of seizure disorder for which she was previously on Keppra.  She notes that she ran out, and her PCP did not restart it.  She has been having intermittent seizures with relatively quick recovery, however multiple seizures today with notable postictal states.  - Telemetry  monitoring - Case discussed with neurology; recommending 4.5 g load followed by 750 mg twice daily - If additional breakthrough seizures should occur, will place formal consult - No indication for EEG at this time as patient is returning back to normal - Neurochecks every 4 hours - Seizure, aspiration and fall precautions  Prediabetes Diet controlled prediabetes with last A1c of 5.9%.  No indication for SSI.  Ankle pain, right History of right ankle arthritis s/p arthroscopy approximately 3 days ago.  She is nonweightbearing on the right leg.  - Continue outpatient pain regimen cautiously given post-ictal state  Essential hypertension - Continue home regimen  Advance Care Planning:   Code Status: Full Code   Consults: None  Family Communication: Patient's sister and fiance updated at bedside  Severity of Illness: The appropriate patient status for this patient is OBSERVATION. Observation status is judged to be reasonable and necessary in order to provide the required intensity of service to ensure the patient's safety. The patient's presenting symptoms, physical exam findings, and initial radiographic and laboratory data in the context of their medical condition is felt to place them at decreased risk for further clinical deterioration. Furthermore, it is anticipated that the patient will be medically stable for discharge from the hospital within 2 midnights of admission.   Author: Avi Body, MD 03/28/2024 6:48 PM  For on call review [email or web address].

## 2024-03-28 NOTE — ED Triage Notes (Addendum)
 Pt to ED via EMS from home c./o witnessed seizure by family lasting less than 20 seconds. Pt has hx of seizures, not taking medications. Had right ankle surgery Friday  On ems arrival pt post ictal, pt had witnessed seizure with EMS. 5MG  Versed  given by EMS. Pt incontinent of urine with seizure   #[id number].  Last VS: CBG 134, 168/91, 96%RA, HR 80s.

## 2024-03-28 NOTE — Assessment & Plan Note (Signed)
 History of right ankle arthritis s/p arthroscopy approximately 3 days ago.  She is nonweightbearing on the right leg.  - Continue outpatient pain regimen cautiously given post-ictal state

## 2024-03-28 NOTE — ED Notes (Signed)
 Pt to CT

## 2024-03-28 NOTE — Progress Notes (Signed)
 PHARMACIST - PHYSICIAN COMMUNICATION  CONCERNING:  Enoxaparin  (Lovenox ) for DVT Prophylaxis    RECOMMENDATION: Patient was prescribed enoxaprin 40mg  q24 hours for VTE prophylaxis.   Filed Weights   03/28/24 1440  Weight: 116.8 kg (257 lb 9.6 oz)    Body mass index is 44.22 kg/m.  Estimated Creatinine Clearance: 93.9 mL/min (by C-G formula based on SCr of 0.9 mg/dL).   Based on [facility] policy patient is candidate for enoxaparin  0.5mg /kg TBW SQ every 24 hours based on BMI being >30.  DESCRIPTION: Pharmacy has adjusted enoxaparin  dose per [facility] policy.  Patient is now receiving enoxaparin  55 mg every 24 hours    Ramonita Burow, PharmD Clinical Pharmacist  03/28/2024 6:31 PM

## 2024-03-29 ENCOUNTER — Other Ambulatory Visit: Payer: Self-pay

## 2024-03-29 DIAGNOSIS — R569 Unspecified convulsions: Secondary | ICD-10-CM | POA: Diagnosis not present

## 2024-03-29 LAB — BASIC METABOLIC PANEL WITH GFR
Anion gap: 9 (ref 5–15)
BUN: 11 mg/dL (ref 6–20)
CO2: 26 mmol/L (ref 22–32)
Calcium: 8.8 mg/dL — ABNORMAL LOW (ref 8.9–10.3)
Chloride: 103 mmol/L (ref 98–111)
Creatinine, Ser: 0.77 mg/dL (ref 0.44–1.00)
GFR, Estimated: >60 mL/min (ref >60)
Glucose, Bld: 94 mg/dL (ref 70–99)
Potassium: 4.2 mmol/L (ref 3.5–5.1)
Sodium: 138 mmol/L (ref 135–145)

## 2024-03-29 LAB — CBC WITH DIFFERENTIAL/PLATELET
Abs Immature Granulocytes: 0.01 10*3/uL (ref 0.00–0.07)
Basophils Absolute: 0 10*3/uL (ref 0.0–0.1)
Basophils Relative: 0 %
Eosinophils Absolute: 0.2 10*3/uL (ref 0.0–0.5)
Eosinophils Relative: 3 %
HCT: 40.6 % (ref 36.0–46.0)
Hemoglobin: 12.1 g/dL (ref 12.0–15.0)
Immature Granulocytes: 0 %
Lymphocytes Relative: 33 %
Lymphs Abs: 1.8 10*3/uL (ref 0.7–4.0)
MCH: 27.6 pg (ref 26.0–34.0)
MCHC: 29.8 g/dL — ABNORMAL LOW (ref 30.0–36.0)
MCV: 92.5 fL (ref 80.0–100.0)
Monocytes Absolute: 0.7 10*3/uL (ref 0.1–1.0)
Monocytes Relative: 12 %
Neutro Abs: 2.7 10*3/uL (ref 1.7–7.7)
Neutrophils Relative %: 52 %
Platelets: 219 10*3/uL (ref 150–400)
RBC: 4.39 MIL/uL (ref 3.87–5.11)
RDW: 15.1 % (ref 11.5–15.5)
WBC: 5.4 10*3/uL (ref 4.0–10.5)
nRBC: 0 % (ref 0.0–0.2)

## 2024-03-29 LAB — HIV ANTIBODY (ROUTINE TESTING W REFLEX): HIV Screen 4th Generation wRfx: NONREACTIVE

## 2024-03-29 NOTE — Progress Notes (Signed)
 Progress Note   Patient: Barbara Petersen ZOX:[id number] DOB: Nov 11, 1971 DOA: 03/28/2024     0 DOS: the patient was seen and examined on 03/29/2024   Brief hospital course: From HPI "Barbara Petersen is a 53 y.o. female with medical history significant of seizure disorder not on AEDs, hypertension, hyperlipidemia, right ankle arthritis s/p recent arthroscopy, GERD, who presents to the ED due to seizure.   History provided by both patient and her sister and fianc at bedside.  [name] states that she remembers feeling unwell earlier this morning, as though her right arm was swelling.  And then she does not remember anything prior to arrival to the ED.  Her fianc, Gaspar Karma, states that she began to have a tonic-clonic seizure that lasted approximately 3-5 minutes.  Due to this, EMS was called.   [name] sister states that she has been having breakthrough seizures here and there, however they are small and she is able to recover relatively well without intervention.  They note she is typically lethargic and confused after seizures.  They note that this is the biggest cluster of seizures she has had in several years now   ED course: On arrival to the ED, patient was hypertensive at 141/76 with heart rate 75.  She was saturating at 97% on room air.  She was afebrile and 97.8.  Initial CBC and CMP unremarkable.  Urinalysis with rare bacteria but no other findings.  UDS positive for benzos (received Versed  via EMS).  CT head with no acute intracranial abnormalities.  Patient suffered an additional breakthrough seizure in the ED lasting between 3-5 minutes. Patient started on Keppra and neurology consulted.  TRH contacted for admission.  "   Assessment and Plan:  Seizure [facility]) Per chart review, patient has a history of seizure disorder for which she was previously on Keppra.  She notes that she ran out, and her PCP did not restart it.  She has been having intermittent seizures with relatively quick recovery,  however multiple seizures today with notable postictal states.   Continue telemetry Case has been discussed with neurology and recommending 4.5 g of Keppra and then continuing with 750 mg IV Keppra twice daily - If additional breakthrough seizures should occur, will place formal consult Case discussed with neuro suggested no indication for EEG at this time Continue every 4 neurochecks Continue seizure, fall and aspiration precaution precaution - Neurochecks every 4 hours - Seizure, aspiration and fall precautions   Prediabetes Diet controlled prediabetes with last A1c of 5.9%.  No indication for SSI.   Ankle pain, right History of right ankle arthritis s/p arthroscopy approximately 3 days ago.  She is nonweightbearing on the right leg.   - Continue outpatient pain regimen cautiously given post-ictal state   Essential hypertension Continue home regimen   Advance Care Planning:   Code Status: Full Code    Consults: None   Family Communication: Patient's sister and fiance updated at bedside  Subjective:  Patient seen and examined in the presence of the fianc as well as the son Denies nausea vomiting abdominal pain or chest pain Keppra level pending Still being monitored closely for any underlying seizures Patient for potential discharge hopefully tomorrow if no seizures observed  Physical Exam:  Vitals and nursing note reviewed.  Constitutional:      General: She is not in acute distress.    Appearance: She is obese.  HENT:     Head: Normocephalic and atraumatic.  Cardiovascular:     Rate  and Rhythm: Normal rate and regular rhythm.     Heart sounds: No murmur heard. Pulmonary:     Effort: Pulmonary effort is normal. No respiratory distress.     Breath sounds: Normal breath sounds. No wheezing.  Abdominal:     General: Bowel sounds are normal.     Palpations: Abdomen is soft.  Musculoskeletal:     Left lower leg: No edema.     Comments: Right lower extremity wrapped  in Ace bandage  Skin:    General: Skin is warm and dry.  Neurological:     Mental Status: She is alert.     Comments:  Patient is alert and oriented, but dazed and not able to provide much history.   Vitals:   03/29/24 0600 03/29/24 0734 03/29/24 0925 03/29/24 1118  BP: 113/73  123/75 93/65  Pulse: (!) 59 62  80  Resp: 17 14  18   Temp:    98 F (36.7 C)  TempSrc:    Oral  SpO2: 99% 98%  98%  Weight:      Height:        Data Reviewed: I have reviewed CT scan of the brain that did not show any acute intracranial pathology    Latest Ref Rng & Units 03/29/2024    3:44 AM 03/28/2024    2:39 PM 06/03/2023   12:36 AM  CBC  WBC 4.0 - 10.5 K/uL 5.4  7.0  7.4   Hemoglobin 12.0 - 15.0 g/dL 56.2  13.0  86.5   Hematocrit 36.0 - 46.0 % 40.6  36.4  39.4   Platelets 150 - 400 K/uL 219  266  282        Latest Ref Rng & Units 03/29/2024    3:44 AM 03/28/2024    2:39 PM 06/03/2023   12:36 AM  BMP  Glucose 70 - 99 mg/dL 94  784  696   BUN 6 - 20 mg/dL 11  15  12    Creatinine 0.44 - 1.00 mg/dL 2.95  2.84  1.32   Sodium 135 - 145 mmol/L 138  136  138   Potassium 3.5 - 5.1 mmol/L 4.2  3.6  3.4   Chloride 98 - 111 mmol/L 103  99  104   CO2 22 - 32 mmol/L 26  26  24    Calcium 8.9 - 10.3 mg/dL 8.8  9.1  9.2      Family Communication: Discussed with family at bedside  Time spent: 56 minutes  Author: Ezzard Holms, MD 03/29/2024 3:24 PM  For on call review [email or web address].

## 2024-03-29 NOTE — ED Notes (Signed)
 Pt eating breakfast

## 2024-03-30 ENCOUNTER — Other Ambulatory Visit: Payer: Self-pay

## 2024-03-30 DIAGNOSIS — R569 Unspecified convulsions: Secondary | ICD-10-CM | POA: Diagnosis not present

## 2024-03-30 LAB — CBC WITH DIFFERENTIAL/PLATELET
Abs Immature Granulocytes: 0.01 10*3/uL (ref 0.00–0.07)
Basophils Absolute: 0 10*3/uL (ref 0.0–0.1)
Basophils Relative: 0 %
Eosinophils Absolute: 0.2 10*3/uL (ref 0.0–0.5)
Eosinophils Relative: 3 %
HCT: 36.8 % (ref 36.0–46.0)
Hemoglobin: 11.5 g/dL — ABNORMAL LOW (ref 12.0–15.0)
Immature Granulocytes: 0 %
Lymphocytes Relative: 35 %
Lymphs Abs: 2.1 10*3/uL (ref 0.7–4.0)
MCH: 27.7 pg (ref 26.0–34.0)
MCHC: 31.3 g/dL (ref 30.0–36.0)
MCV: 88.7 fL (ref 80.0–100.0)
Monocytes Absolute: 0.7 10*3/uL (ref 0.1–1.0)
Monocytes Relative: 12 %
Neutro Abs: 3.1 10*3/uL (ref 1.7–7.7)
Neutrophils Relative %: 50 %
Platelets: 293 10*3/uL (ref 150–400)
RBC: 4.15 MIL/uL (ref 3.87–5.11)
RDW: 15 % (ref 11.5–15.5)
WBC: 6.1 10*3/uL (ref 4.0–10.5)
nRBC: 0 % (ref 0.0–0.2)

## 2024-03-30 LAB — BASIC METABOLIC PANEL WITH GFR
Anion gap: 6 (ref 5–15)
BUN: 16 mg/dL (ref 6–20)
CO2: 28 mmol/L (ref 22–32)
Calcium: 8.4 mg/dL — ABNORMAL LOW (ref 8.9–10.3)
Chloride: 102 mmol/L (ref 98–111)
Creatinine, Ser: 0.94 mg/dL (ref 0.44–1.00)
GFR, Estimated: >60 mL/min (ref >60)
Glucose, Bld: 92 mg/dL (ref 70–99)
Potassium: 4.1 mmol/L (ref 3.5–5.1)
Sodium: 136 mmol/L (ref 135–145)

## 2024-03-30 MED ORDER — LEVETIRACETAM 750 MG PO TABS
750.0000 mg | ORAL_TABLET | Freq: Two times a day (BID) | ORAL | 5 refills | Status: AC
  Filled 2024-03-30: qty 60, 30d supply, fill #0

## 2024-03-30 NOTE — Discharge Summary (Signed)
 Physician Discharge Summary   Patient: Barbara Petersen MRN: [id number] DOB: [birth year]  Admit date:     03/28/2024  Discharge date: 03/30/24  Discharge Physician: Ezzard Holms   PCP: [facility]   Recommendations at discharge:  Follow-up with PCP  Discharge Diagnoses:  Seizure (HCC) Prediabetes Ankle pain, right Essential hypertension   Hospital Course: Barbara Petersen is a 53 y.o. female with medical history significant of seizure disorder not on AEDs, hypertension, hyperlipidemia, right ankle arthritis s/p recent arthroscopy, GERD, who presents to the ED due to seizure. History provided by both patient and her sister and fianc at bedside.  Ms. Bakken states that she remembers feeling unwell the morning of presentation as though her right arm was swelling.  And then she does not remember anything prior to arrival to the ED.  Her fianc, Barbara Petersen, states that she began to have a tonic-clonic seizure that lasted approximately 3-5 minutes.  Due to this, EMS was called.   Ms. Heiden sister states that she has been having breakthrough seizures here and there, however they are small and she is able to recover relatively well without intervention.  They note she is typically lethargic and confused after seizures.  They note that this is the biggest cluster of seizures she has had in several years now CT head with no acute intracranial abnormalities.  She has been off antiepileptic medication for some time now due to insurance issues.  Case was discussed with neurologist and patient was loaded with Keppra and then placed on 750 mg twice daily.  She has not had any seizures on admission therefore being discharged home to follow-up with PCP and neurology.  Consultants: Neurology Procedures performed: None Disposition: Home Diet recommendation:  Cardiac and Carb modified diet DISCHARGE MEDICATION: Allergies as of 03/30/2024       Reactions   Gabapentin Swelling, Other (See Comments)    Confusion        Medication List     STOP taking these medications    lisinopril  20 MG tablet Commonly known as: ZESTRIL    methocarbamol  500 MG tablet Commonly known as: ROBAXIN    methylPREDNISolone  4 MG Tbpk tablet Commonly known as: MEDROL  DOSEPAK   naproxen  500 MG tablet Commonly known as: Naprosyn    pravastatin  20 MG tablet Commonly known as: PRAVACHOL    predniSONE  50 MG tablet Commonly known as: DELTASONE        TAKE these medications    acetaminophen  500 MG tablet Commonly known as: TYLENOL  Take 1,000 mg by mouth every 8 (eight) hours as needed for mild pain (pain score 1-3) (for 14 days).   albuterol  108 (90 Base) MCG/ACT inhaler Commonly known as: VENTOLIN  HFA Inhale 2 puffs into the lungs every 6 (six) hours as needed for wheezing or shortness of breath.   aspirin  81 MG chewable tablet Chew 81 mg by mouth daily.   baclofen  10 MG tablet Commonly known as: LIORESAL  Take 10 mg by mouth 3 (three) times daily.   DSS 100 MG Caps Take 100 mg by mouth in the morning and at bedtime. For 14 days   HYDROcodone -acetaminophen  5-325 MG tablet Commonly known as: NORCO/VICODIN Take 1 tablet by mouth every 6 (six) hours as needed for moderate pain.   ibuprofen  800 MG tablet Commonly known as: ADVIL  Take 800 mg by mouth every 8 (eight) hours as needed for mild pain (pain score 1-3).   levETIRAcetam 750 MG tablet Commonly known as: KEPPRA Take 1 tablet (750 mg total) by mouth 2 (two)  times daily.   lisinopril -hydrochlorothiazide  20-12.5 MG tablet Commonly known as: ZESTORETIC Take 1 tablet by mouth daily.   metFORMIN  500 MG tablet Commonly known as: GLUCOPHAGE  TAKE ONE TABLET BY MOUTH EVERY DAY WITH BREAKFAST What changed: Another medication with the same name was removed. Continue taking this medication, and follow the directions you see here.   oxyCODONE  5 MG immediate release tablet Commonly known as: Oxy IR/ROXICODONE  Take 5 mg by mouth every 4  (four) hours as needed for severe pain (pain score 7-10).   pregabalin 75 MG capsule Commonly known as: LYRICA Take 75 mg by mouth 2 (two) times daily.   promethazine 12.5 MG tablet Commonly known as: PHENERGAN Take 12.5 mg by mouth every 6 (six) hours as needed for vomiting or nausea.        Follow-up Information     Potter, Zachary E, MD. Schedule an appointment as soon as possible for a visit .   Specialty: Neurology Contact information: [address] [facility] [place] [address] [phone]         [facility] Follow up.   Specialty: Family Medicine Why: Hospital follow up Contact information: [address] [place] [facility] [address] [phone]                Discharge Exam: Filed Weights   03/28/24 1440  Weight: 116.8 kg   Vitals and nursing note reviewed.  Constitutional:      General: She is not in acute distress.    Appearance: She is obese.  HENT:     Head: Normocephalic and atraumatic.  Cardiovascular:     Rate and Rhythm: Normal rate and regular rhythm.     Heart sounds: No murmur heard. Pulmonary:     Effort: Pulmonary effort is normal. No respiratory distress.     Breath sounds: Normal breath sounds. No wheezing.  Abdominal:     General: Bowel sounds are normal.     Palpations: Abdomen is soft.  Musculoskeletal:     Left lower leg: No edema.     Comments: Right lower extremity wrapped in Ace bandage  Skin:    General: Skin is warm and dry.  Neurological:     Mental Status: She is alert.  Fully alert and communicative    Comments:   Condition at discharge: good  The results of significant diagnostics from this hospitalization (including imaging, microbiology, ancillary and laboratory) are listed below for reference.   Imaging Studies: CT HEAD WO CONTRAST ( ) Result Date: 03/28/2024 CLINICAL DATA:  Seizure disorder, clinical change EXAM: CT HEAD WITHOUT CONTRAST TECHNIQUE: Contiguous axial  images were obtained from the base of the skull through the vertex without intravenous contrast. RADIATION DOSE REDUCTION: This exam was performed according to the departmental dose-optimization program which includes automated exposure control, adjustment of the mA and/or kV according to patient size and/or use of iterative reconstruction technique. COMPARISON:  Head CT 04/27/2018 FINDINGS: Brain: No intracranial hemorrhage, mass effect, or midline shift. No hydrocephalus. The basilar cisterns are patent. Chronic enlarged empty sella. No evidence of territorial infarct or acute ischemia. No extra-axial or intracranial fluid collection. Vascular: No hyperdense vessel or unexpected calcification. Skull: Normal. Negative for fracture or focal lesion. Sinuses/Orbits: Minimal retained secretions within the right side of sphenoid sinus. Other: None. IMPRESSION: 1. No acute intracranial abnormality. 2. Chronic enlarged empty sella. This can be seen with idiopathic intracranial hypertension. Electronically Signed   By: Chadwick Colonel M.D.   On: 03/28/2024 16:40  Microbiology: Results for orders placed or performed during the hospital encounter of 11/02/23  Resp panel by RT-PCR (RSV, Flu A&B, Covid) Anterior Nasal Swab     Status: None   Collection Time: 11/02/23  1:42 PM   Specimen: Anterior Nasal Swab  Result Value Ref Range Status   SARS Coronavirus 2 by RT PCR NEGATIVE NEGATIVE Final    Comment: (NOTE) SARS-CoV-2 target nucleic acids are NOT DETECTED.  The SARS-CoV-2 RNA is generally detectable in upper respiratory specimens during the acute phase of infection. The lowest concentration of SARS-CoV-2 viral copies this assay can detect is 138 copies/mL. A negative result does not preclude SARS-Cov-2 infection and should not be used as the sole basis for treatment or other patient management decisions. A negative result may occur with  improper specimen collection/handling, submission of specimen  other than nasopharyngeal swab, presence of viral mutation(s) within the areas targeted by this assay, and inadequate number of viral copies(<138 copies/mL). A negative result must be combined with clinical observations, patient history, and epidemiological information. The expected result is Negative.  Fact Sheet for Patients:  BloggerCourse.com  Fact Sheet for Healthcare Providers:  SeriousBroker.it  This test is no t yet approved or cleared by the [place]  FDA and  has been authorized for detection and/or diagnosis of SARS-CoV-2 by FDA under an Emergency Use Authorization (EUA). This EUA will remain  in effect (meaning this test can be used) for the duration of the COVID-19 declaration under Section 564(b)(1) of the Act, 21 U.S.C.section 360bbb-3(b)(1), unless the authorization is terminated  or revoked sooner.       Influenza A by PCR NEGATIVE NEGATIVE Final   Influenza B by PCR NEGATIVE NEGATIVE Final    Comment: (NOTE) The Xpert Xpress SARS-CoV-2/FLU/RSV plus assay is intended as an aid in the diagnosis of influenza from Nasopharyngeal swab specimens and should not be used as a sole basis for treatment. Nasal washings and aspirates are unacceptable for Xpert Xpress SARS-CoV-2/FLU/RSV testing.  Fact Sheet for Patients: BloggerCourse.com  Fact Sheet for Healthcare Providers: SeriousBroker.it  This test is not yet approved or cleared by the [place]  FDA and has been authorized for detection and/or diagnosis of SARS-CoV-2 by FDA under an Emergency Use Authorization (EUA). This EUA will remain in effect (meaning this test can be used) for the duration of the COVID-19 declaration under Section 564(b)(1) of the Act, 21 U.S.C. section 360bbb-3(b)(1), unless the authorization is terminated or revoked.     Resp Syncytial Virus by PCR NEGATIVE NEGATIVE Final     Comment: (NOTE) Fact Sheet for Patients: BloggerCourse.com  Fact Sheet for Healthcare Providers: SeriousBroker.it  This test is not yet approved or cleared by the [place]  FDA and has been authorized for detection and/or diagnosis of SARS-CoV-2 by FDA under an Emergency Use Authorization (EUA). This EUA will remain in effect (meaning this test can be used) for the duration of the COVID-19 declaration under Section 564(b)(1) of the Act, 21 U.S.C. section 360bbb-3(b)(1), unless the authorization is terminated or revoked.  Performed at [facility], [address] [place], [place] [address]     Labs: CBC: Recent Labs  Lab 03/28/24 1439 03/29/24 0344 03/30/24 0431  WBC 7.0 5.4 6.1  NEUTROABS  --  2.7 3.1  HGB 11.7* 12.1 11.5*  HCT 36.4 40.6 36.8  MCV 88.1 92.5 88.7  PLT 266 219 293   Basic Metabolic Panel: Recent Labs  Lab 03/28/24 1439 03/29/24 0344 03/30/24 0431  NA 136 138 136  K 3.6 4.2 4.1  CL 99 103 102  CO2 26 26 28   GLUCOSE 141* 94 92  BUN 15 11 16   CREATININE 0.90 0.77 0.94  CALCIUM 9.1 8.8* 8.4*   Liver Function Tests: Recent Labs  Lab 03/28/24 1439  AST 25  ALT 16  ALKPHOS 51  BILITOT 0.5  PROT 8.0  ALBUMIN 3.7   CBG: Recent Labs  Lab 03/28/24 1443  GLUCAP 114*    Discharge time spent:  38 minutes.  Signed: Ezzard Holms, MD [facility] 03/30/2024

## 2024-03-30 NOTE — Progress Notes (Addendum)
Received MD order to discharge patient to home, reviewed discharge instructions, home meds, prescriptions and follow up appointments with patient and patient verbalized understanding   

## 2024-03-30 NOTE — TOC Transition Note (Signed)
 Transition of Care [facility]) - Discharge Note   Patient Details  Name: Barbara Petersen MRN: [id number] Date of Birth: 01-30-1971  Transition of [facility]) CM/SW Contact:  Crayton Docker, RN 03/30/2024, 12:16 PM   Clinical Narrative:     Discharge orders noted for home/self care. Private transportation arranged. No identified needs.   Final next level of care: Home/Self Care Barriers to Discharge: No Barriers Identified   Patient Goals and CMS Choice    Home/self care  Discharge Placement    Home/self care           Discharge Plan and Services Additional resources added to the After Visit Summary for      Home/self care      Social Drivers of Health (SDOH) Interventions SDOH Screenings   Food Insecurity: No Food Insecurity (03/29/2024)  Housing: Low Risk  (03/29/2024)  Transportation Needs: No Transportation Needs (03/29/2024)  Utilities: Not At Risk (03/29/2024)  Alcohol Screen: Low Risk  (08/17/2020)  Depression (PHQ2-9): Low Risk  (09/21/2020)  Financial Resource Strain: Low Risk  (03/25/2024)   Received from [facility]  Physical Activity: Insufficiently Active (08/17/2020)  Social Connections: Socially Isolated (03/29/2024)  Stress: No Stress Concern Present (08/17/2020)  Tobacco Use: High Risk (03/28/2024)     Readmission Risk Interventions     No data to display

## 2024-03-30 NOTE — Plan of Care (Signed)

## 2024-03-31 LAB — LEVETIRACETAM LEVEL: Levetiracetam Lvl: 37.2 ug/mL (ref 10.0–40.0)
